# Patient Record
Sex: Female | Born: 1985 | Race: Black or African American | Hispanic: No | Marital: Single | Smoking: Former smoker
Health system: Southern US, Community
[De-identification: ages and names within clinical notes are randomized; demographics above are authoritative.]

## PROBLEM LIST (undated history)

## (undated) VITALS — BP 146/95 | HR 96 | Temp 98.2°F | Resp 18

## (undated) DIAGNOSIS — F250 Schizoaffective disorder, bipolar type: Secondary | ICD-10-CM

## (undated) DIAGNOSIS — IMO0001 Reserved for inherently not codable concepts without codable children: Secondary | ICD-10-CM

## (undated) DIAGNOSIS — E119 Type 2 diabetes mellitus without complications: Secondary | ICD-10-CM

## (undated) DIAGNOSIS — F259 Schizoaffective disorder, unspecified: Secondary | ICD-10-CM

---

## 2003-04-15 ENCOUNTER — Emergency Department (HOSPITAL_COMMUNITY): Admission: EM | Admit: 2003-04-15 | Discharge: 2003-04-15 | Payer: Self-pay | Admitting: Emergency Medicine

## 2003-09-24 ENCOUNTER — Emergency Department (HOSPITAL_COMMUNITY): Admission: EM | Admit: 2003-09-24 | Discharge: 2003-09-25 | Payer: Self-pay | Admitting: Emergency Medicine

## 2003-10-09 ENCOUNTER — Emergency Department (HOSPITAL_COMMUNITY): Admission: EM | Admit: 2003-10-09 | Discharge: 2003-10-09 | Payer: Self-pay | Admitting: Emergency Medicine

## 2003-11-07 ENCOUNTER — Emergency Department (HOSPITAL_COMMUNITY): Admission: EM | Admit: 2003-11-07 | Discharge: 2003-11-08 | Payer: Self-pay | Admitting: Emergency Medicine

## 2006-01-25 ENCOUNTER — Emergency Department (HOSPITAL_COMMUNITY): Admission: EM | Admit: 2006-01-25 | Discharge: 2006-01-26 | Payer: Self-pay | Admitting: Emergency Medicine

## 2014-06-05 ENCOUNTER — Emergency Department (HOSPITAL_COMMUNITY)
Admission: EM | Admit: 2014-06-05 | Discharge: 2014-06-10 | Disposition: A | Discharge status: Home / Self Care | Payer: MEDICAID | Attending: Emergency Medicine | Admitting: Emergency Medicine

## 2014-06-05 ENCOUNTER — Encounter (HOSPITAL_COMMUNITY): Payer: Self-pay | Admitting: Emergency Medicine

## 2014-06-05 DIAGNOSIS — Z79899 Other long term (current) drug therapy: Secondary | ICD-10-CM | POA: Diagnosis not present

## 2014-06-05 DIAGNOSIS — F259 Schizoaffective disorder, unspecified: Secondary | ICD-10-CM | POA: Diagnosis present

## 2014-06-05 DIAGNOSIS — Z720 Tobacco use: Secondary | ICD-10-CM | POA: Insufficient documentation

## 2014-06-05 DIAGNOSIS — F1310 Sedative, hypnotic or anxiolytic abuse, uncomplicated: Secondary | ICD-10-CM | POA: Diagnosis not present

## 2014-06-05 DIAGNOSIS — F258 Other schizoaffective disorders: Secondary | ICD-10-CM

## 2014-06-05 DIAGNOSIS — R440 Auditory hallucinations: Secondary | ICD-10-CM

## 2014-06-05 DIAGNOSIS — R441 Visual hallucinations: Secondary | ICD-10-CM | POA: Diagnosis present

## 2014-06-05 DIAGNOSIS — F250 Schizoaffective disorder, bipolar type: Secondary | ICD-10-CM | POA: Diagnosis not present

## 2014-06-05 DIAGNOSIS — F79 Unspecified intellectual disabilities: Secondary | ICD-10-CM | POA: Diagnosis not present

## 2014-06-05 HISTORY — DX: Schizoaffective disorder, unspecified: F25.9

## 2014-06-05 HISTORY — DX: Schizoaffective disorder, bipolar type: F25.0

## 2014-06-05 HISTORY — DX: Reserved for inherently not codable concepts without codable children: IMO0001

## 2014-06-05 LAB — COMPREHENSIVE METABOLIC PANEL
ALT: 9 U/L (ref 0–35)
AST: 16 U/L (ref 0–37)
Albumin: 3.8 g/dL (ref 3.5–5.2)
Alkaline Phosphatase: 45 U/L (ref 39–117)
Anion gap: 5 (ref 5–15)
BILIRUBIN TOTAL: 0.3 mg/dL (ref 0.3–1.2)
BUN: 12 mg/dL (ref 6–23)
CHLORIDE: 109 mmol/L (ref 96–112)
CO2: 28 mmol/L (ref 19–32)
CREATININE: 0.55 mg/dL (ref 0.50–1.10)
Calcium: 9 mg/dL (ref 8.4–10.5)
GFR calc Af Amer: >90 mL/min (ref >90)
Glucose, Bld: 101 mg/dL — ABNORMAL HIGH (ref 70–99)
Potassium: 3.7 mmol/L (ref 3.5–5.1)
SODIUM: 142 mmol/L (ref 135–145)
Total Protein: 7.9 g/dL (ref 6.0–8.3)

## 2014-06-05 LAB — SALICYLATE LEVEL: Salicylate Lvl: <4 mg/dL (ref 2.8–20.0)

## 2014-06-05 LAB — ETHANOL

## 2014-06-05 LAB — CBC
HEMATOCRIT: 32.6 % — AB (ref 36.0–46.0)
HEMOGLOBIN: 10.3 g/dL — AB (ref 12.0–15.0)
MCH: 30.1 pg (ref 26.0–34.0)
MCHC: 31.6 g/dL (ref 30.0–36.0)
MCV: 95.3 fL (ref 78.0–100.0)
Platelets: 228 10*3/uL (ref 150–400)
RBC: 3.42 MIL/uL — AB (ref 3.87–5.11)
RDW: 12.8 % (ref 11.5–15.5)
WBC: 7 10*3/uL (ref 4.0–10.5)

## 2014-06-05 LAB — ACETAMINOPHEN LEVEL

## 2014-06-05 MED ORDER — LORAZEPAM 1 MG PO TABS
1.0000 mg | ORAL_TABLET | Freq: Three times a day (TID) | ORAL | Status: DC | PRN
  Administered 2014-06-06 – 2014-06-08 (×4): 1 mg via ORAL
  Filled 2014-06-05 (×5): qty 1

## 2014-06-05 MED ORDER — ONDANSETRON HCL 4 MG PO TABS: 4.0000 mg | ORAL_TABLET | Freq: Three times a day (TID) | ORAL | Status: DC | PRN

## 2014-06-05 MED ORDER — ACETAMINOPHEN 325 MG PO TABS
650.0000 mg | ORAL_TABLET | ORAL | Status: DC | PRN
  Administered 2014-06-08: 650 mg via ORAL
  Filled 2014-06-05: qty 2

## 2014-06-05 MED ORDER — ALUM & MAG HYDROXIDE-SIMETH 200-200-20 MG/5ML PO SUSP
30.0000 mL | ORAL | Status: DC | PRN
  Filled 2014-06-05: qty 30

## 2014-06-05 MED ORDER — IBUPROFEN 200 MG PO TABS: 600.0000 mg | ORAL_TABLET | Freq: Three times a day (TID) | ORAL | Status: DC | PRN

## 2014-06-05 NOTE — ED Provider Notes (Signed)
CSN: [id number]     Arrival date & time 06/05/14  2211 History   First MD Initiated Contact with Patient 06/05/14 2248     Chief Complaint  Patient presents with  . Schizophrenia     (Consider location/radiation/quality/duration/timing/severity/associated sxs/prior Treatment) HPI Comments: Patient is a 29 year old female with a past medical history of schizoaffective disorder and mental retardation who presents via [facility] Department for IVC. Patient was being transported to a [facility] group home when her schizoaffective symptoms acutely worsened. Patient is now having visual hallucinations and delusional thoughts. 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 According to her caregiver, patient has not been sleeping, eating, or cleaning herself. Patient reports taking her medication daily. She also had auditory hallucinations telling her to kill her house mate.     Past Medical History  Diagnosis Date  . Schizo-affective schizophrenia   . Retardation    No past surgical history on file. No family history on file. History  Substance Use Topics  . Smoking status: Current Some Day Smoker  . Smokeless tobacco: Never Used  . Alcohol Use: No   OB History    No data available     Review of Systems  Constitutional: Negative for fever, chills and fatigue.  HENT: Negative for trouble swallowing.   Eyes: Negative for visual disturbance.  Respiratory: Negative for shortness of breath.   Cardiovascular: Negative for chest pain and palpitations.  Gastrointestinal: Negative for nausea, vomiting, abdominal pain and diarrhea.  Genitourinary: Negative for dysuria and difficulty urinating.  Musculoskeletal: Negative for arthralgias and neck pain.  Skin: Negative for color change.  Neurological: Negative for dizziness and weakness.   Psychiatric/Behavioral: Positive for hallucinations. Negative for dysphoric mood.      Allergies  Review of patient's allergies indicates no known allergies.  Home Medications   Prior to Admission medications   Medication Sig Start Date End Date Taking? Authorizing Provider  acetaminophen (TYLENOL) 500 MG tablet Take 500 mg by mouth every 6 (six) hours as needed (for pain.).   Yes Historical Provider, MD  benztropine (COGENTIN) 1 MG tablet Take 1 mg by mouth 2 (two) times daily.   Yes Historical Provider, MD  Cholecalciferol (VITAMIN D3) 2000 UNITS TABS Take 2,000 Units by mouth daily.   Yes Historical Provider, MD  clonazePAM (KLONOPIN) 1 MG tablet Take 1 mg by mouth 2 (two) times daily.   Yes Historical Provider, MD  ferrous sulfate 325 (65 FE) MG tablet Take 325 mg by mouth daily.   Yes Historical Provider, MD  haloperidol (HALDOL) 2 MG/ML solution Take 10 mg by mouth 2 (two) times daily.   Yes Historical Provider, MD  potassium chloride (K-DUR) 10 MEQ tablet Take 10 mEq by mouth daily.   Yes Historical Provider, MD  trazodone (DESYREL) 300 MG tablet Take 300 mg by mouth at bedtime.   Yes Historical Provider, MD  valproate (DEPAKENE) 250 MG/5ML syrup Take 500-1,000 mg by mouth 2 (two) times daily. 500 mg in the morning and 1000 mg at bedtime.   Yes Historical Provider, MD   BP 138/73 mmHg  Pulse 85  Temp(Src) 97.8 F (36.6 C) (Oral)  Resp 20  SpO2 97%  LMP  (LMP Unknown) Physical Exam  Constitutional: She is oriented to person, place, and time. She appears well-developed and well-nourished. No distress.  HENT:  Head: Normocephalic  and atraumatic.  Eyes: Conjunctivae and EOM are normal.  Neck: Normal range of motion.  Cardiovascular: Normal rate and regular rhythm.  Exam reveals no gallop and no friction rub.   No murmur heard. Pulmonary/Chest: Effort normal and breath sounds normal. She has no wheezes. She has no rales. She exhibits no tenderness.  Abdominal: Soft. There is  no tenderness.  Musculoskeletal: Normal range of motion.  Neurological: She is alert and oriented to person, place, and time. Coordination normal.  Speech is goal-oriented. Moves limbs without ataxia.   Skin: Skin is warm and dry.  Psychiatric: She has a normal mood and affect.  Patient is delusional and having visual hallucinations. Patient has a tangential thought process and odd behavior. She is calm and pleasant.   Nursing note and vitals reviewed.   ED Course  Procedures (including critical care time) Labs Review Labs Reviewed  ACETAMINOPHEN LEVEL - Abnormal; Notable for the following:    Acetaminophen (Tylenol), Serum <10.0 (*)    All other components within normal limits  CBC - Abnormal; Notable for the following:    RBC 3.42 (*)    Hemoglobin 10.3 (*)    HCT 32.6 (*)    All other components within normal limits  COMPREHENSIVE METABOLIC PANEL - Abnormal; Notable for the following:    Glucose, Bld 101 (*)    All other components within normal limits  ETHANOL  SALICYLATE LEVEL  URINE RAPID DRUG SCREEN (HOSP PERFORMED)  VALPROIC ACID LEVEL    Imaging Review No results found.   EKG Interpretation None      MDM   Final diagnoses:  Visual hallucination  Auditory hallucination    11:34 PM Labs and urinalysis pending. Patient is delusion and having visual hallucinations. Vitals stable and patient afebrile.    Emilia BeckKaitlyn Joahan Swatzell, PA-C 06/06/14 0107  Olivia Mackielga M Otter, MD 06/06/14 [phone]

## 2014-06-05 NOTE — ED Notes (Signed)
Pt arrived under IVC paperwork [facility].  Per the paperwork which was taken out by a Child psychotherapistsocial worker, patient has been diagnosed with schizoaffective disorder and moderate retardation.  Pt takes medications , which according, to patient she has been taking but according to the paperwork she is not.  Paperwork has indicated that she is not sleeping, eating or taking care of her personal hygene.  Patient is stated to be hearing voices telling her to kill her house mate.  Exam of patient finds her having delusions and flight of ideas.  Pt states she is a super star. Pt believes that an ambulance has come to her house and killed everyone in it including herself.

## 2014-06-06 ENCOUNTER — Encounter (HOSPITAL_COMMUNITY): Payer: Self-pay | Admitting: Emergency Medicine

## 2014-06-06 DIAGNOSIS — F209 Schizophrenia, unspecified: Secondary | ICD-10-CM

## 2014-06-06 DIAGNOSIS — F259 Schizoaffective disorder, unspecified: Secondary | ICD-10-CM | POA: Diagnosis present

## 2014-06-06 DIAGNOSIS — R440 Auditory hallucinations: Secondary | ICD-10-CM | POA: Insufficient documentation

## 2014-06-06 DIAGNOSIS — R441 Visual hallucinations: Secondary | ICD-10-CM | POA: Insufficient documentation

## 2014-06-06 DIAGNOSIS — F258 Other schizoaffective disorders: Secondary | ICD-10-CM

## 2014-06-06 LAB — RAPID URINE DRUG SCREEN, HOSP PERFORMED
AMPHETAMINES: NOT DETECTED
BARBITURATES: NOT DETECTED
BENZODIAZEPINES: POSITIVE — AB
Cocaine: NOT DETECTED
OPIATES: NOT DETECTED
TETRAHYDROCANNABINOL: NOT DETECTED

## 2014-06-06 LAB — VALPROIC ACID LEVEL: Valproic Acid Lvl: 79.7 ug/mL (ref 50.0–100.0)

## 2014-06-06 MED ORDER — VALPROIC ACID 250 MG/5ML PO SYRP
1000.0000 mg | ORAL_SOLUTION | Freq: Every day | ORAL | Status: DC
  Filled 2014-06-06: qty 20

## 2014-06-06 MED ORDER — HALOPERIDOL 5 MG PO TABS: 10.0000 mg | ORAL_TABLET | Freq: Two times a day (BID) | ORAL | Status: DC

## 2014-06-06 MED ORDER — CLONAZEPAM 1 MG PO TABS
1.0000 mg | ORAL_TABLET | Freq: Two times a day (BID) | ORAL | Status: DC
  Administered 2014-06-06 – 2014-06-10 (×9): 1 mg via ORAL
  Filled 2014-06-06: qty 2
  Filled 2014-06-06 (×4): qty 1
  Filled 2014-06-06: qty 2
  Filled 2014-06-06: qty 1
  Filled 2014-06-06: qty 2
  Filled 2014-06-06: qty 1

## 2014-06-06 MED ORDER — HALOPERIDOL LACTATE 2 MG/ML PO CONC
10.0000 mg | Freq: Two times a day (BID) | ORAL | Status: DC
  Administered 2014-06-06 – 2014-06-10 (×9): 10 mg via ORAL
  Filled 2014-06-06 (×14): qty 5

## 2014-06-06 MED ORDER — BENZTROPINE MESYLATE 1 MG PO TABS: 1.0000 mg | ORAL_TABLET | Freq: Two times a day (BID) | ORAL | Status: DC

## 2014-06-06 MED ORDER — HALOPERIDOL LACTATE 2 MG/ML PO CONC: 10.0000 mg | Freq: Two times a day (BID) | ORAL | Status: DC

## 2014-06-06 MED ORDER — CLONAZEPAM 0.5 MG PO TABS: 1.0000 mg | ORAL_TABLET | Freq: Two times a day (BID) | ORAL | Status: DC

## 2014-06-06 MED ORDER — TRAZODONE HCL 100 MG PO TABS
300.0000 mg | ORAL_TABLET | Freq: Every day | ORAL | Status: DC
  Administered 2014-06-06 – 2014-06-09 (×4): 300 mg via ORAL
  Filled 2014-06-06 (×4): qty 3

## 2014-06-06 MED ORDER — FERROUS SULFATE 325 (65 FE) MG PO TABS
325.0000 mg | ORAL_TABLET | Freq: Every day | ORAL | Status: DC
  Administered 2014-06-06 – 2014-06-10 (×5): 325 mg via ORAL
  Filled 2014-06-06 (×5): qty 1

## 2014-06-06 MED ORDER — POTASSIUM CHLORIDE ER 10 MEQ PO TBCR
10.0000 meq | EXTENDED_RELEASE_TABLET | Freq: Every day | ORAL | Status: DC
  Administered 2014-06-06 – 2014-06-10 (×5): 10 meq via ORAL
  Filled 2014-06-06 (×6): qty 1

## 2014-06-06 MED ORDER — VALPROIC ACID 250 MG/5ML PO SYRP
1000.0000 mg | ORAL_SOLUTION | Freq: Every day | ORAL | Status: DC
  Administered 2014-06-06 – 2014-06-09 (×4): 1000 mg via ORAL
  Filled 2014-06-06 (×7): qty 20

## 2014-06-06 MED ORDER — BENZTROPINE MESYLATE 1 MG PO TABS
1.0000 mg | ORAL_TABLET | Freq: Two times a day (BID) | ORAL | Status: DC
  Administered 2014-06-06 – 2014-06-10 (×9): 1 mg via ORAL
  Filled 2014-06-06 (×9): qty 1

## 2014-06-06 MED ORDER — VALPROIC ACID 250 MG/5ML PO SYRP
500.0000 mg | ORAL_SOLUTION | Freq: Every day | ORAL | Status: DC
  Administered 2014-06-06 – 2014-06-10 (×5): 500 mg via ORAL
  Filled 2014-06-06 (×5): qty 10
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
## 2014-06-06 NOTE — ED Notes (Signed)
Patient is resting comfortably. 

## 2014-06-06 NOTE — BHH Counselor (Signed)
Brittany Velez, [facility], confirms adult unit is currently at capacity. Contacted the following facilities for placement:  BED AVAILABLE, FAXED CLINICAL INFORMATION: [facility], per [facility], per [employer]  AT CAPACITY: [facility], per [facility], per [facility], per [facility], per [facility], per [facility], per [facility], per [facility], per [facility], per [facility], per [facility]  NO RESPONSE: [facility]   [address] Tall[address]Ford Ellis Patsy BaltimoreWarrick Jr, WisconsinLPC, [facility] Triage Specialist [phone]

## 2014-06-06 NOTE — BH Assessment (Signed)
Assessment completed. Consulted [name], NP who recommended inpatient treatment. TTS will contact other facilities for placement. Emilia BeckKaitlyn Szekalski, PA-C has been informed of the recommendation.

## 2014-06-06 NOTE — ED Notes (Signed)
Psychiatry at bedside.

## 2014-06-06 NOTE — ED Notes (Signed)
Patient resting quietly with eyes closed. Appears in no acute distress. Respirations are even and unlabored. Sitter remains at bedside.

## 2014-06-06 NOTE — ED Notes (Signed)
Pt has three shirt, one pants, one pair socks, one pair shoes, one bra, two jackets. Pt has two belonging bags in locker #31.

## 2014-06-06 NOTE — ED Notes (Signed)
Pt drunk all her drink and milk from her PM meal tray. Provided patient a cup of Sprite. When patient took her medication, she made reference about a rainbow before taking her cogentin and before taking her Klonopin, she stated "I am going to fuck you up" while looking at her pills. Pt will not communicate with staff. When she does talk, she is experiencing hallucinations. Sitter remains at bedside.

## 2014-06-06 NOTE — ED Notes (Signed)
Pt is resting quietly on her left side. Sitter at bedside. Since start of shift change, intermittently seen patient talking to herself. She does deny SI or HI. Pt will not talk beyond telling me she is not SI or HI.

## 2014-06-06 NOTE — ED Notes (Signed)
Pt ate 75% of dinner.  

## 2014-06-06 NOTE — ED Notes (Signed)
Bed: [id number] Expected date: 06/06/14 Expected time: 11:44 AM Means of arrival: Ambulance Comments: Antony Madurautledge

## 2014-06-06 NOTE — ED Notes (Signed)
Offered toileting but patient denied needing to go to the restroom. Provided patient another cup of Sprite. Sitter remains at bedside.

## 2014-06-06 NOTE — ED Notes (Signed)
Patient ate 100% of her lunch. 

## 2014-06-06 NOTE — ED Notes (Signed)
[facility] Expected date:  Expected time:  Means of arrival:  Comments: Room 10 in hall, maintenance in room 10

## 2014-06-06 NOTE — Progress Notes (Signed)
CSW called her guardian Sherron MondayStacey Skradski in order to obtain her IQ paperwork.  CSW contacted [facility] and [facility]nn [facility] who reported would be willing to consider her for placement but not until her psychological paperwork was obtained.  Follow up will be needed with her guardian in order to obtain the paperwork.  Adelene AmasEdith Sarann Tregre, LCSW Disposition Social Worker [phone]

## 2014-06-06 NOTE — Consult Note (Signed)
[facility] Face-to-Face [facility]iatry Consult   Reason for Consult:  Psychosis,NOS, Delusion Referring Physician:  EDP Patient Identification: Brittany FellerLinda Velez MRN:  [id number] Principal Diagnosis: Schizophrenia, schizoaffective, chronic with acute exacerbation Diagnosis:   Patient Active Problem List   Diagnosis Date Noted  . Schizophrenia, schizoaffective, chronic with acute exacerbation [F25.8] 06/06/2014    Total Time spent with patient: 45 minutes  Subjective:   Brittany Velez is a 29 y.o. female patient admitted with Delusion.  HPI: AA female was brought in under IVC from her group home for delusional thoughts and auditory hallucination.  Patient was not able to answer questions about her visit to the ER but lightened up when asked about her pending Gram[employer] attendance next month.  Patient stated that people behind her ER room wall will be taking her to the [employer] award ceremony.   Patient was not able to to name her group home and did not remember having a house mate.  She denied wanting to kill his house mate.  She reported poor sleep and appetite.  Patient looked disheveled seems to have not been taking care of her ADLS.  She had flight of ideas jumping from one topic to another.  We have accepted patient for admission and will be looking for bed.  We have resumed all of her medication at this time.  She denies SI/HI/AVH.  HPI Elements:   Location:  Schizophrenia ScHIZOAFFECTIVE DISORDER. Quality:  SEVERE. Severity:  SEVERE. Timing:  Acute,. Duration:  Chronic Mental illness. Context:  Brought in for evaluation from a group home. for Psychosis, delusion.  Past Medical History:  Past Medical History  Diagnosis Date  . Schizo-affective schizophrenia   . Retardation    History reviewed. No pertinent past surgical history. Family History: History reviewed. No pertinent family history. Social History:  History  Alcohol Use No     History  Drug Use No    History   Social History  .  Marital Status: Single    Spouse Name: N/A    Number of Children: N/A  . Years of Education: N/A   Social History Main Topics  . Smoking status: Current Some Day Smoker  . Smokeless tobacco: Never Used  . Alcohol Use: No  . Drug Use: No  . Sexual Activity: Yes   Other Topics Concern  . None   Social History Narrative   Additional Social History:    History of alcohol / drug use?: No history of alcohol / drug abuse                     Allergies:  No Known Allergies  Vitals: Blood pressure 130/89, pulse 86, temperature 98.2 F (36.8 C), temperature source Oral, resp. rate 17, SpO2 100 %.  Risk to Self: Suicidal Ideation: No Suicidal Intent: No Is patient at risk for suicide?: No Suicidal Plan?: No Access to Means: No What has been your use of drugs/alcohol within the last 12 months?: No alcohol or drug use reported.  How many times?: 0 Other Self Harm Risks: It has been reported that pt has been banging her head on the wall.  Triggers for Past Attempts: None known Intentional Self Injurious Behavior:  (Head banging ) Risk to Others: Homicidal Ideation: No Thoughts of Harm to Others: No Current Homicidal Intent: No Current Homicidal Plan: No Access to Homicidal Means: No Identified Victim: NA (NA) History of harm to others?: No Assessment of Violence: On admission Violent Behavior Description: No violent behaviors observed at this  time. Pt is calm and cooperative.  Does patient have access to weapons?: No Criminal Charges Pending?: No Does patient have a court date: No Prior Inpatient Therapy: Prior Inpatient Therapy: Yes Prior Therapy Dates: 2008 Prior Therapy Facilty/Provider(s): [facility]  Reason for Treatment: Schizoaffective Prior Outpatient Therapy: Prior Outpatient Therapy: Yes Prior Therapy Dates: 2015 Prior Therapy Facilty/Provider(s): Provider name unknown at this time. [facility]  Reason for Treatment: Schizoaffective disorder  Current  Facility-Administered Medications  Medication Dose Route Frequency Provider Last Rate Last Dose  . acetaminophen (TYLENOL) tablet 650 mg  650 mg Oral Q4H PRN Emilia Beck, PA-C      . alum & mag hydroxide-simeth (MAALOX/MYLANTA) 200-200-20 MG/5ML suspension 30 mL  30 mL Oral PRN Emilia Beck, PA-C      . benztropine (COGENTIN) tablet 1 mg  1 mg Oral BID Kristeen Mans, NP   1 mg at 06/06/14 1006  . clonazePAM (KLONOPIN) tablet 1 mg  1 mg Oral BID Kristeen Mans, NP   1 mg at 06/06/14 1006  . ferrous sulfate tablet 325 mg  325 mg Oral Daily Kristeen Mans, NP   325 mg at 06/06/14 1006  . haloperidol (HALDOL) 2 MG/ML solution 10 mg  10 mg Oral BID Kristeen Mans, NP   10 mg at 06/06/14 1006  . ibuprofen (ADVIL,MOTRIN) tablet 600 mg  600 mg Oral Q8H PRN Emilia Beck, PA-C      . LORazepam (ATIVAN) tablet 1 mg  1 mg Oral Q8H PRN Emilia Beck, PA-C      . ondansetron (ZOFRAN) tablet 4 mg  4 mg Oral Q8H PRN Kaitlyn Szekalski, PA-C      . potassium chloride (K-DUR) CR tablet 10 mEq  10 mEq Oral Daily Kristeen Mans, NP   10 mEq at 06/06/14 1006  . traZODone (DESYREL) tablet 300 mg  300 mg Oral QHS Kristeen Mans, NP      . Valproic Acid (DEPAKENE) 250 MG/5ML syrup SYRP 1,000 mg  1,000 mg Oral QHS Kristeen Mans, NP      . Valproic Acid (DEPAKENE) 250 MG/5ML syrup SYRP 500 mg  500 mg Oral Daily Kristeen Mans, NP   500 mg at 06/06/14 1007   Current Outpatient Prescriptions  Medication Sig Dispense Refill  . acetaminophen (TYLENOL) 500 MG tablet Take 500 mg by mouth every 6 (six) hours as needed (for pain.).    Marland Kitchen benztropine (COGENTIN) 1 MG tablet Take 1 mg by mouth 2 (two) times daily.    . Cholecalciferol (VITAMIN D3) 2000 UNITS TABS Take 2,000 Units by mouth daily.    . clonazePAM (KLONOPIN) 1 MG tablet Take 1 mg by mouth 2 (two) times daily.    . ferrous sulfate 325 (65 FE) MG tablet Take 325 mg by mouth daily.    . haloperidol (HALDOL) 2 MG/ML solution Take 10 mg by mouth 2 (two) times  daily.    . potassium chloride (K-DUR) 10 MEQ tablet Take 10 mEq by mouth daily.    . trazodone (DESYREL) 300 MG tablet Take 300 mg by mouth at bedtime.    . valproate (DEPAKENE) 250 MG/5ML syrup Take 500-1,000 mg by mouth 2 (two) times daily. 500 mg in the morning and 1000 mg at bedtime.      Musculoskeletal: Strength & Muscle Tone: unable to assess, was seen lying down in bed Gait & Station: assessed in bed lying down Patient leans: assesssed in bed   Psychiatric Specialty Exam:  Blood pressure 130/89, pulse 86, temperature 98.2 F (36.8 C), temperature source Oral, resp. rate 17, SpO2 100 %.There is no height or weight on file to calculate BMI.  General Appearance: Casual and Disheveled  Eye Contact::  Poor  Speech:  Blocked and Slow  Volume:  Decreased  Mood:  Dysphoric  Affect:  Blunt and Constricted  Thought Process:  Circumstantial, Disorganized, Linear and Tangential  Orientation:  Full (Time, Place, and Person)  Thought Content:  Delusions and Hallucinations: Auditory Visual  Suicidal Thoughts:  No  Homicidal Thoughts:  No  Memory:  unable to ascertain at this time.  Judgement:  Impaired  Insight:  Shallow  Psychomotor Activity:  Normal  Concentration:  Poor  Recall:  NA  Fund of Knowledge:Poor  Language: Poor  Akathisia:  NA  Handed:  Right  AIMS (if indicated):     Assets:  Desire for Improvement  ADL's:  Impaired  Cognition: Impaired,  Severe  Sleep:      Medical Decision Making: Self-Limited or Minor (1), Review of Psycho-Social Stressors (1), Review or order clinical lab tests (1), Decision to obtain old records (1), Review and summation of old records (2), Established Problem, Worsening (2), Review of Medication Regimen & Side Effects (2) and Review of New Medication or Change in Dosage (2)  Problem Points: Established problem, worsening (2) and Review of psycho-social stressors (1)  Data Points: Decision to obtain old records (1) Review of  medication regiment & side effects (2) Review of new medications or change in dosage (2)  Treatment Plan Summary: Daily contact with patient to assess and evaluate symptoms and progress in treatment, Medication management and Plan Admit to inpatient Psychiatric unit when bed is available.  Plan:  Recommend psychiatric Inpatient admission when medically cleared. Disposition: Admit to any inpatient Psychiatric facility with available bed.  Earney Navy  PMHNP-BC 06/06/2014 12:32 PM  Patient seen, evaluated and I agree with notes by Nurse Practitioner. Thedore Mins, MD

## 2014-06-07 MED ORDER — LORAZEPAM 2 MG/ML IJ SOLN
2.0000 mg | Freq: Once | INTRAMUSCULAR | Status: AC
  Administered 2014-06-07: 2 mg via INTRAMUSCULAR
  Filled 2014-06-07: qty 1

## 2014-06-07 MED ORDER — DIPHENHYDRAMINE HCL 50 MG/ML IJ SOLN
50.0000 mg | Freq: Once | INTRAMUSCULAR | Status: AC
  Administered 2014-06-07: 50 mg via INTRAMUSCULAR
  Filled 2014-06-07: qty 1

## 2014-06-07 MED ORDER — ZIPRASIDONE MESYLATE 20 MG IM SOLR: 20.0000 mg | Freq: Once | INTRAMUSCULAR | Status: AC | PRN

## 2014-06-07 NOTE — ED Notes (Signed)
Pt resting quietly with eyes closed. Lying on her back. Respirations are even and unlabored. Skin dry. Sitter remains at bedside.

## 2014-06-07 NOTE — Progress Notes (Signed)
CSW was notified by NP that patient has been declined by [facility].   Trish MageBrittney Tyeisha Dinan, LCSWA [phone] ED CSW 06/07/2014 5:12 PM

## 2014-06-07 NOTE — ED Notes (Signed)
Pt resting quietly with eyes closed. Appears in no distress. Respirations are even, unlabored. Skin is dry. Sitter remains at bedside.

## 2014-06-07 NOTE — Progress Notes (Addendum)
[facility] pending psychological. CSW spoke with Cassandra from [employer] who will fax Psychological once in the office. CSW awaiting return call.   Byrd HesselbachKristen June Vacha, LCSW [phone]  ED CSW 06/07/2014 945am  Addendum: [facility], patient is still being reviewed pending psychological.   Byrd HesselbachKristen Tamika Shropshire, LCSW [phone]  ED CSW 06/07/2014 10:00 AM

## 2014-06-07 NOTE — BH Assessment (Addendum)
[facility] Assessment Progress Note  After several communications with [facility], they have acknowledged receiving all documentation that they need in order to staff this pt with their physician.  Decision is pending at this time.  [facility].  They report that pt's IQ is too low for their program and are therefore declining pt.  Doylene Canninghomas Tashawna Thom, MA Triage Specialist 06/07/2014 @ 15:30

## 2014-06-07 NOTE — ED Notes (Addendum)
Patient is resting comfortably with eyes cloded. Sitter at bedside. Respira ions are even, unlabored. Skin is dry.

## 2014-06-07 NOTE — ED Notes (Signed)
Observed patient this morning get up out of bed, walk to restroom and use facilities on her own (without assistance from staff).

## 2014-06-07 NOTE — Progress Notes (Signed)
Per guardian, patient from Brittany Velez, Brittany Velez is Brittany Velez [phone]. Per guardian pt is being accepted back to group home once psychiatrically stable.   Byrd HesselbachKristen Dasan Hardman, LCSW [phone]  ED CSW 06/07/2014 3:01 PM

## 2014-06-07 NOTE — ED Notes (Signed)
[facility] Expected date: 06/07/14 Expected time:  Means of arrival:  Comments: Hold for hall c

## 2014-06-07 NOTE — ED Notes (Signed)
Pt alert and oriented x4. Respirations even and unlabored, bilateral symmetrical rise and fall of chest. Skin warm and dry. In no acute distress. Denies needs.   

## 2014-06-07 NOTE — ED Notes (Signed)
Patient saw visitor go by and became aggressive towards sitter. Patient did not harm staff or sitter. Patient was yelling and screaming at staff and sitter. With help from other team members and security patient was redirected to stretcher and given juice. Patient is now calmly sitting on stretcher with sitter at bedside.

## 2014-06-07 NOTE — ED Notes (Signed)
Patient with minimal interaction during initial nursing assessment.  Is oriented to person and birthdate only.  Is periodically responding to internal stimuli. Oriented to unit.  Food/fluids given.

## 2014-06-07 NOTE — ED Notes (Signed)
Pt woke up, sitting on stretcher. Provided patient two warm blankets. Will intermittently talk softly to no particular person.

## 2014-06-07 NOTE — Consult Note (Signed)
[facility]   Reason for Consult:  Psychosis and Delusional Referring Physician:  EDP Patient Identification: Brittany FellerLinda Velez MRN:  [id number] Principal Diagnosis: Schizophrenia, schizoaffective, chronic with acute exacerbation Diagnosis:   Patient Active Problem List   Diagnosis Date Noted  . Schizophrenia, schizoaffective, chronic with acute exacerbation [F25.8] 06/06/2014  . Auditory hallucination [R44.0]   . Visual hallucination [R44.1]     Total Time spent with patient: 30 minutes  Subjective:   Brittany FellerLinda Velez is a 29 y.o. female patient admitted with delusion and psychosis.   HPI: Brittany Velez is a 29 yo [place] female who was brought in under IVC from her group home for delusional thoughts and auditory hallucinations.  Patient states "the police brought me here from the group home for bad behavior." She is seen today in the Emergency Department. She is alert, calm and cooperative. She is undoing her braided hair "because my spirit doesn't like it." She reports she does not like being alone and does not want to "be put any where dark." She continues to talk about "dancing with [name]" at [employer] next [place].  She denies SI/HI. She is responding to auditory hallucinations. Marland Kitchen.    HPI Elements:    Location: Mood (Schizophrenia) Quality: Severe Severity: Severe Timing: Acute Duration: Chronic Mental Illness Context: psychosis, delusion - brought in from group home for evaluation  Past Medical History:  Past Medical History  Diagnosis Date  . Schizo-affective schizophrenia   . Retardation    History reviewed. No pertinent past surgical history. Family History: History reviewed. No pertinent family history. Social History:  History  Alcohol Use No     History  Drug Use No    History   Social History  . Marital Status: Single    Spouse Name: N/A    Number of Children: N/A  . Years of Education: N/A   Social History Main Topics  .  Smoking status: Current Some Day Smoker  . Smokeless tobacco: Never Used  . Alcohol Use: No  . Drug Use: No  . Sexual Activity: Yes   Other Topics Concern  . None   Social History Narrative   Review of Systems  Constitutional: Negative.   HENT: Negative.   Eyes: Negative.   Respiratory: Negative.   Cardiovascular: Negative.   Gastrointestinal: Negative.   Genitourinary: Negative.   Musculoskeletal: Negative.   Skin: Negative.   Neurological: Negative.   Endo/Heme/Allergies: Negative.   Psychiatric/Behavioral: Positive for hallucinations.       Auditory hallucinations   Additional Social History:    History of alcohol / drug use?: No history of alcohol / drug abuse  Allergies:  No Known Allergies  Vitals: Blood pressure 116/65, pulse 85, temperature 98.1 F (36.7 C), temperature source Oral, resp. rate 16, SpO2 100 %.  Risk to Self: Suicidal Ideation: No Suicidal Intent: No Is patient at risk for suicide?: No Suicidal Plan?: No Access to Means: No What has been your use of drugs/alcohol within the last 12 months?: No alcohol or drug use reported.  How many times?: 0 Other Self Harm Risks: It has been reported that pt has been banging her head on the wall.  Triggers for Past Attempts: None known Intentional Self Injurious Behavior:  (Head banging ) Risk to Others: Homicidal Ideation: No Thoughts of Harm to Others: No Current Homicidal Intent: No Current Homicidal Plan: No Access to Homicidal Means: No Identified Victim: NA (NA) History of harm to others?: No Assessment of Violence: On  admission Violent Behavior Description: No violent behaviors observed at this time. Pt is calm and cooperative.  Does patient have access to weapons?: No Criminal Charges Pending?: No Does patient have a court date: No Prior Inpatient Therapy: Prior Inpatient Therapy: Yes Prior Therapy Dates: 2008 Prior Therapy Facilty/Provider(s): [facility]  Reason for Treatment:  Schizoaffective Prior Outpatient Therapy: Prior Outpatient Therapy: Yes Prior Therapy Dates: 2015 Prior Therapy Facilty/Provider(s): Provider name unknown at this time. [facility]  Reason for Treatment: Schizoaffective disorder  Current Facility-Administered Medications  Medication Dose Route Frequency Provider Last Rate Last Dose  . acetaminophen (TYLENOL) tablet 650 mg  650 mg Oral Q4H PRN Emilia Beck, PA-C      . alum & mag hydroxide-simeth (MAALOX/MYLANTA) 200-200-20 MG/5ML suspension 30 mL  30 mL Oral PRN Emilia Beck, PA-C      . benztropine (COGENTIN) tablet 1 mg  1 mg Oral BID Kristeen Mans, NP   1 mg at 06/07/14 1610  . clonazePAM (KLONOPIN) tablet 1 mg  1 mg Oral BID Kristeen Mans, NP   1 mg at 06/07/14 9604  . ferrous sulfate tablet 325 mg  325 mg Oral Daily Kristeen Mans, NP   325 mg at 06/07/14 5409  . haloperidol (HALDOL) 2 MG/ML solution 10 mg  10 mg Oral BID Kristeen Mans, NP   10 mg at 06/07/14 8119  . ibuprofen (ADVIL,MOTRIN) tablet 600 mg  600 mg Oral Q8H PRN Emilia Beck, PA-C      . LORazepam (ATIVAN) tablet 1 mg  1 mg Oral Q8H PRN Emilia Beck, PA-C   1 mg at 06/06/14 1630  . ondansetron (ZOFRAN) tablet 4 mg  4 mg Oral Q8H PRN Emilia Beck, PA-C      . potassium chloride (K-DUR) CR tablet 10 mEq  10 mEq Oral Daily Kristeen Mans, NP   10 mEq at 06/07/14 1478  . traZODone (DESYREL) tablet 300 mg  300 mg Oral QHS Kristeen Mans, NP   300 mg at 06/06/14 2125  . Valproic Acid (DEPAKENE) 250 MG/5ML syrup SYRP 1,000 mg  1,000 mg Oral QHS Kristeen Mans, NP   1,000 mg at 06/06/14 2220  . Valproic Acid (DEPAKENE) 250 MG/5ML syrup SYRP 500 mg  500 mg Oral Daily Kristeen Mans, NP   500 mg at 06/07/14 2956   Current Outpatient Prescriptions  Medication Sig Dispense Refill  . acetaminophen (TYLENOL) 500 MG tablet Take 500 mg by mouth every 6 (six) hours as needed (for pain.).    Marland Kitchen benztropine (COGENTIN) 1 MG tablet Take 1 mg by mouth 2 (two) times daily.     . Cholecalciferol (VITAMIN D3) 2000 UNITS TABS Take 2,000 Units by mouth daily.    . clonazePAM (KLONOPIN) 1 MG tablet Take 1 mg by mouth 2 (two) times daily.    . ferrous sulfate 325 (65 FE) MG tablet Take 325 mg by mouth daily.    . haloperidol (HALDOL) 2 MG/ML solution Take 10 mg by mouth 2 (two) times daily.    . potassium chloride (K-DUR) 10 MEQ tablet Take 10 mEq by mouth daily.    . trazodone (DESYREL) 300 MG tablet Take 300 mg by mouth at bedtime.    . valproate (DEPAKENE) 250 MG/5ML syrup Take 500-1,000 mg by mouth 2 (two) times daily. 500 mg in the morning and 1000 mg at bedtime.      Musculoskeletal: Strength & Muscle Tone: unable to assess, was seen lying down in bed Gait &  Station: assessed in bed lying down Patient leans: assesssed in bed   Psychiatric Specialty Exam:     Blood pressure 116/65, pulse 85, temperature 98.1 F (36.7 C), temperature source Oral, resp. rate 16, SpO2 100 %.There is no height or weight on file to calculate BMI.  General Appearance: Casual and Disheveled  Eye Contact::  Poor  Speech:  Blocked and Slow  Volume:  Decreased  Mood:  Dysphoric  Affect:  Blunt and Constricted  Thought Process:  Circumstantial, Disorganized, Linear and Tangential  Orientation:  Full (Time, Place, and Person)  Thought Content:  Delusions and Hallucinations: Auditory Visual  Suicidal Thoughts:  No  Homicidal Thoughts:  No  Memory:  Unable to assess at this time  Judgement:  Impaired  Insight:  Shallow  Psychomotor Activity:  Normal  Concentration:  Poor  Recall:  NA  Fund of Knowledge:Poor  Language: Poor  Akathisia:  NA  Handed:  Right  AIMS (if indicated):     Assets:  Desire for Improvement  ADL's:  Impaired  Cognition: Impaired,  Severe  Sleep:      Medical Decision Making: Self-Limited or Minor (1), Review of Psycho-Social Stressors (1), Review or order clinical lab tests (1), Decision to obtain old records (1), Review and summation of old records  (2), Established Problem, Worsening (2), Review of Medication Regimen & Side Effects (2) and Review of New Medication or Change in Dosage (2)  Problem Points: Established problem, worsening (2) and Review of psycho-social stressors (1)  Data Points: Decision to obtain old records (1) Review of medication regiment & side effects (2) Review of new medications or change in dosage (2)  Treatment Plan Summary: -Daily contact with patient to assess and evaluate symptoms and progress in treatment -Medication management: home medications have been restarted -Seek inpatient psychiatric placement  Plan:  Recommend psychiatric Inpatient admission when medically cleared.   Disposition: Patient currently under review at [facility]. Continue to seek inpatient placement.   Alberteen Sam, FNP-BC [facility] 06/07/2014 10:56 AM   Patient seen, evaluated and I agree with notes by Nurse Practitioner. Thedore Mins, MD

## 2014-06-07 NOTE — Progress Notes (Signed)
Pt guardian faxing psychological for [facility] to review. CSW to send to [facility] once received.   Byrd HesselbachKristen Lorrayne Ismael, LCSW [phone]  ED CSW 06/07/2014 11:32 AM

## 2014-06-08 DIAGNOSIS — F258 Other schizoaffective disorders: Secondary | ICD-10-CM

## 2014-06-08 NOTE — Progress Notes (Addendum)
Per discussion with psychiatrist who recommended patient continue to stabilize in the ED, and return to group home when psychiatrically stable.   CSW attempted group home at numbers provided, however not sure if this is a correct number.  CSW called and got another number [phone].   Pt guardian, [facility] to compare current medications.     Byrd HesselbachKristen Ericson Nafziger, LCSW [phone]  ED CSW 06/08/2014 1144am

## 2014-06-08 NOTE — Progress Notes (Signed)
Pt alert, disorganized but cooperative. Affect/mood depressed and constricted. Pt has no insight and judgement is impaired. +A/vhall, frequently states someone is in her room and she is scared. Staff continuously provide redirection and reorientation. Emotional support and encouragement given. Will continue to monitor closely and evaluate for stabilization.

## 2014-06-08 NOTE — Progress Notes (Signed)
[facility] in front of [facility].  [facility], "I want to get the hell out of here! Why won't they let me go!"  [facility] to [facility] patient.  [facility] asked [facility], "Where do you want to go?" Patient yelled, "I want to go to my group home!!"  [facility] explained to patient that they are here to help her and will be happy to send her back to her group home, but it is too late at night to discharge her.  Patient stopped yelling.  BH tech escorted patient to day room.

## 2014-06-08 NOTE — ED Notes (Signed)
Brittany Velez showered and donned fresh scrubs/socks. She requested assistance. Although she appears physically able to manage her own shower, her disorganization limits her ability to follow through. Pt was unable to remember when she had showered last and asked why water "doesn't connect with" her as it does other people.

## 2014-06-08 NOTE — ED Notes (Signed)
Patient upset. Responding to internal stimuli, tearful and restless; pacing. Paranoid about going to her room. States "you can't make me go in there. I want to go home. Let me out. They made a big mistake". Encouragement offered. Patient taken to dayroom.

## 2014-06-08 NOTE — Consult Note (Signed)
[facility] Face-to-Face Psychiatry Consult   Reason for Consult:  Psychosis and Delusional Referring Physician:  EDP Patient Identification: Brittany Velez MRN:  [id number] Principal Diagnosis: Schizophrenia, schizoaffective, chronic with acute exacerbation Diagnosis:   Patient Active Problem List   Diagnosis Date Noted  . Schizophrenia, schizoaffective, chronic with acute exacerbation [F25.8] 06/06/2014  . Auditory hallucination [R44.0]   . Visual hallucination [R44.1]     Total Time spent with patient: 15 minutes  Subjective:   Brittany Velez is a 29 y.o. female patient admitted with delusion and psychosis.   HPI: Brittany Velez is a 29 yo [place] female who was brought in under IVC from her group home for delusional thoughts and auditory hallucinations.  Patient states "the police brought me here from the group home for bad behavior." She is seen today in the Emergency Department. She is alert, calm and cooperative. She is undoing her braided hair "because my spirit doesn't like it." She reports she does not like being alone and does not want to "be put any where dark." She continues to talk about "dancing with Donn Pierini" at the [place] next month.  She denies SI/HI. She is responding to auditory hallucinations.   Above note reviewed with updates added.  Patient is verbalizing much better today and was seen in front of the nursing station asking for drinks.  Patient, however stated that she is rich and plans to attend the [employer].  Patient reported that Donn Pierini is coming from the wall behind her TV to take her to the award show.   This seems to be her base line, we plan a discharge as soon as patient maintains stability and is taking her medications.  HPI Elements:    Location: Mood (Schizophrenia) Quality: Severe Severity: Severe Timing: Acute Duration: Chronic Mental Illness Context: psychosis, delusion - brought in from group home for evaluation  Past Medical  History:  Past Medical History  Diagnosis Date  . Schizo-affective schizophrenia   . Retardation    History reviewed. No pertinent past surgical history. Family History: History reviewed. No pertinent family history. Social History:  History  Alcohol Use No     History  Drug Use No    History   Social History  . Marital Status: Single    Spouse Name: N/A    Number of Children: N/A  . Years of Education: N/A   Social History Main Topics  . Smoking status: Current Some Day Smoker  . Smokeless tobacco: Never Used  . Alcohol Use: No  . Drug Use: No  . Sexual Activity: Yes   Other Topics Concern  . None   Social History Narrative   ROS Additional Social History:    History of alcohol / drug use?: No history of alcohol / drug abuse  Allergies:  No Known Allergies  Vitals: Blood pressure 103/59, pulse 84, temperature 98.1 F (36.7 C), temperature source Oral, resp. rate 14, SpO2 99 %.  Risk to Self: Suicidal Ideation: No Suicidal Intent: No Is patient at risk for suicide?: No Suicidal Plan?: No Access to Means: No What has been your use of drugs/alcohol within the last 12 months?: No alcohol or drug use reported.  How many times?: 0 Other Self Harm Risks: It has been reported that pt has been banging her head on the wall.  Triggers for Past Attempts: None known Intentional Self Injurious Behavior:  (Head banging ) Risk to Others: Homicidal Ideation: No Thoughts of Harm to Others: No Current Homicidal Intent: No  Current Homicidal Plan: No Access to Homicidal Means: No Identified Victim: NA (NA) History of harm to others?: No Assessment of Violence: On admission Violent Behavior Description: No violent behaviors observed at this time. Pt is calm and cooperative.  Does patient have access to weapons?: No Criminal Charges Pending?: No Does patient have a court date: No Prior Inpatient Therapy: Prior Inpatient Therapy: Yes Prior Therapy Dates: 2008 Prior  Therapy Facilty/Provider(s): [facility]  Reason for Treatment: Schizoaffective Prior Outpatient Therapy: Prior Outpatient Therapy: Yes Prior Therapy Dates: 2015 Prior Therapy Facilty/Provider(s): Provider name unknown at this time. [facility]  Reason for Treatment: Schizoaffective disorder  Current Facility-Administered Medications  Medication Dose Route Frequency Provider Last Rate Last Dose  . acetaminophen (TYLENOL) tablet 650 mg  650 mg Oral Q4H PRN Emilia Beck, PA-C      . alum & mag hydroxide-simeth (MAALOX/MYLANTA) 200-200-20 MG/5ML suspension 30 mL  30 mL Oral PRN Emilia Beck, PA-C      . benztropine (COGENTIN) tablet 1 mg  1 mg Oral BID Kristeen Mans, NP   1 mg at 06/08/14 0910  . clonazePAM (KLONOPIN) tablet 1 mg  1 mg Oral BID Kristeen Mans, NP   1 mg at 06/08/14 0910  . ferrous sulfate tablet 325 mg  325 mg Oral Daily Kristeen Mans, NP   325 mg at 06/08/14 0910  . haloperidol (HALDOL) 2 MG/ML solution 10 mg  10 mg Oral BID Kristeen Mans, NP   10 mg at 06/08/14 0914  . ibuprofen (ADVIL,MOTRIN) tablet 600 mg  600 mg Oral Q8H PRN Emilia Beck, PA-C      . LORazepam (ATIVAN) tablet 1 mg  1 mg Oral Q8H PRN Emilia Beck, PA-C   1 mg at 06/08/14 1212  . ondansetron (ZOFRAN) tablet 4 mg  4 mg Oral Q8H PRN Emilia Beck, PA-C      . potassium chloride (K-DUR) CR tablet 10 mEq  10 mEq Oral Daily Kristeen Mans, NP   10 mEq at 06/08/14 0910  . traZODone (DESYREL) tablet 300 mg  300 mg Oral QHS Kristeen Mans, NP   300 mg at 06/07/14 2116  . Valproic Acid (DEPAKENE) 250 MG/5ML syrup SYRP 1,000 mg  1,000 mg Oral QHS Kristeen Mans, NP   1,000 mg at 06/07/14 2117  . Valproic Acid (DEPAKENE) 250 MG/5ML syrup SYRP 500 mg  500 mg Oral Daily Kristeen Mans, NP   500 mg at 06/08/14 4098   Current Outpatient Prescriptions  Medication Sig Dispense Refill  . acetaminophen (TYLENOL) 500 MG tablet Take 500 mg by mouth every 6 (six) hours as needed (for pain.).    Marland Kitchen benztropine  (COGENTIN) 1 MG tablet Take 1 mg by mouth 2 (two) times daily.    . Cholecalciferol (VITAMIN D3) 2000 UNITS TABS Take 2,000 Units by mouth daily.    . clonazePAM (KLONOPIN) 1 MG tablet Take 1 mg by mouth 2 (two) times daily.    . ferrous sulfate 325 (65 FE) MG tablet Take 325 mg by mouth daily.    . haloperidol (HALDOL) 2 MG/ML solution Take 10 mg by mouth 2 (two) times daily.    . potassium chloride (K-DUR) 10 MEQ tablet Take 10 mEq by mouth daily.    . trazodone (DESYREL) 300 MG tablet Take 300 mg by mouth at bedtime.    . valproate (DEPAKENE) 250 MG/5ML syrup Take 500-1,000 mg by mouth 2 (two) times daily. 500 mg in the morning and 1000 mg  at bedtime.      Musculoskeletal: Strength & Muscle Tone: unable to assess, was seen lying down in bed Gait & Station: assessed in bed lying down Patient leans: assesssed in bed   Psychiatric Specialty Exam:     Blood pressure 103/59, pulse 84, temperature 98.1 F (36.7 C), temperature source Oral, resp. rate 14, SpO2 99 %.There is no height or weight on file to calculate BMI.  General Appearance: Casual and Disheveled  Eye Contact::  Poor  Speech:  Blocked and Slow  Volume:  Decreased  Mood:  Dysphoric  Affect:  Blunt and Constricted  Thought Process:  Circumstantial, Disorganized, Linear and Tangential  Orientation:  Full (Time, Place, and Person)  Thought Content:  Delusions and Hallucinations: Auditory Visual  Suicidal Thoughts:  No  Homicidal Thoughts:  No  Memory:  Unable to assess at this time  Judgement:  Impaired  Insight:  Shallow  Psychomotor Activity:  Normal  Concentration:  Poor  Recall:  NA  Fund of Knowledge:Poor  Language: Poor  Akathisia:  NA  Handed:  Right  AIMS (if indicated):     Assets:  Desire for Improvement  ADL's:  Impaired  Cognition: Impaired,  Severe  Sleep:      Medical Decision Making: Self-Limited or Minor (1), Review of Psycho-Social Stressors (1), Review or order clinical lab tests (1), Decision  to obtain old records (1), Review and summation of old records (2), Established Problem, Worsening (2), Review of Medication Regimen & Side Effects (2) and Review of New Medication or Change in Dosage (2)  Problem Points: Established problem, worsening (2) and Review of psycho-social stressors (1)  Data Points: Decision to obtain old records (1) Review of medication regiment & side effects (2) Review of new medications or change in dosage (2)  Treatment Plan Summary: -Daily contact with patient to assess and evaluate symptoms and progress in treatment -Medication management: home medications have been restarted -Seek inpatient psychiatric placement  Goal is to stabilize patient here if no placement is available and send her back to her facility.  Plan:  Recommend psychiatric Inpatient admission when medically cleared.   Disposition: Patient currently under review by some facilities.  We are also treating and managing patient with plan to send her back to her family care home.  Dahlia ByesJosephine Onuoha,  PMHNP-BC 06/08/2014 02:32 Patient seen, evaluated and I agree with notes by Nurse Practitioner. Thedore MinsMojeed Adine Heimann, MD

## 2014-06-08 NOTE — ED Notes (Signed)
Introduced self to pt and encouraged her to verbalize needs. She was cooperative and denied SI/HI. Speech was difficult to understand. She asked, "When am I going to get my clothes back?" Explained rules and provided additional blanket. Pt appeared to be anxious but not acutely distressed. Will continue to monitor for needs and safety.

## 2014-06-08 NOTE — ED Notes (Signed)
She denies AVH but appears to be responding to internal stimuli. She remains pleasant thus far.

## 2014-06-08 NOTE — ED Notes (Signed)
Patient anxious, agitated; pacing, tremulous. Reports that she wants to go home. Communicating with internal stimuli. Patient reminded that she will be informed of updates to her treatment plan as they are made. Patient encouraged, reminded that she is safe at the hospital. Patient yelling "let me out. I want to go home". Patient redirected. Allowed to sit in dayroom. Ordered medications provided.   Q 15 safety checks continue.

## 2014-06-08 NOTE — ED Notes (Signed)
Patient anxious, appears sad. Patient reports SI. Responding to internal stimuli. Patient is tremulous, worried about being able to go back to her group home. Feeling abandoned, reports "They left me here all by myself". States sleep has been poor. Reports difficulty staying asleep.   Encouragement offered. Ativan given.  Q 15 safety checks continue.

## 2014-06-08 NOTE — ED Notes (Signed)
Ms. Brittany Velez says that there is a "bomb in my stomach" and says it's going to go off. "I'm going to explode." She urged this Clinical research associatewriter to listen in her ear for ticking. Provided pt with 1 mg Ativan PRN and reassurance. Pt is afraid at times to be in room.

## 2014-06-08 NOTE — Progress Notes (Signed)
[facility] [address] [place], [place] [address] [phone]

## 2014-06-09 NOTE — [facility] Suicide Risk Assessment (Cosign Needed)
Suicide Risk Assessment  Discharge Assessment   [facility]   Demographic Factors:  Low socioeconomic status and Unemployed  Total Time spent with patient: 30 minutes  Musculoskeletal: Strength & Muscle Tone: within normal limits Gait & Station: normal Patient leans: N/A  Psychiatric Specialty Exam:     Blood pressure 111/55, pulse 78, temperature 98.1 F (36.7 C), temperature source Oral, resp. rate 18, SpO2 99 %.There is no height or weight on file to calculate BMI.  General Appearance: Casual  Eye Contact::  Good  Speech:  Clear and Coherent and Normal Rate409  Volume:  Normal  Mood:  Depressed  Affect:  Congruent  Thought Process:  Coherent and Linear  Orientation:  Full (Time, Place, and Person)  Thought Content:  Delusions  Suicidal Thoughts:  No  Homicidal Thoughts:  No  Memory:  Immediate;   Fair Recent;   Fair Remote;   Fair  Judgement:  Fair  Insight:  Shallow  Psychomotor Activity:  Normal  Concentration:  Fair  Recall:  NA  Fund of Knowledge:Fair  Language: Good  Akathisia:  NA  Handed:  Right  AIMS (if indicated):     Assets:  Desire for Improvement  Sleep:     Cognition: Impaired,  Moderate  ADL's:  Intact      Has this patient used any form of tobacco in the last 30 days? (Cigarettes, Smokeless Tobacco, Cigars, and/or Pipes) No  Mental Status Per Nursing Assessment::   On Admission:     Current Mental Status by Physician: NA  Loss Factors: NA  Historical Factors: NA  Risk Reduction Factors:   Living with another person, especially a relative and Positive social support  Continued Clinical Symptoms:  Schizophrenia:   Paranoid or undifferentiated type  Cognitive Features That Contribute To Risk:  Closed-mindedness, Polarized thinking and Thought constriction (tunnel vision)    Suicide Risk:  Minimal: No identifiable suicidal ideation.  Patients presenting with no risk factors but with morbid ruminations;  may be classified as minimal risk based on the severity of the depressive symptoms  Principal Problem: Schizophrenia, schizoaffective, chronic with acute exacerbation Discharge Diagnoses:  Patient Active Problem List   Diagnosis Date Noted  . Schizophrenia, schizoaffective, chronic with acute exacerbation [F25.8] 06/06/2014  . Auditory hallucination [R44.0]   . Visual hallucination [R44.1]       Plan Of Care/Follow-up recommendations:  Activity:  AS TOLERATED Diet:  REGULAR  Is patient on multiple antipsychotic therapies at discharge:  No   Has Patient had three or more failed trials of antipsychotic monotherapy by history:  No  Recommended Plan for Multiple Antipsychotic Therapies: NA    Stewart Pimenta, C   PMHNP-BC 06/09/2014, 11:31 AM

## 2014-06-09 NOTE — Progress Notes (Signed)
Per psychiatrist and NP, patient is psychiatrically stable to [facility]. CSW called Cyndi Lennertonya Jones at [phone] to discuss discharge. CSW left message.   CSW contacted guardian, [employer] at [phone] regarding discharge. CSW spoke with Cassandra from [employer] who stated she will get cell number for Cyndi Lennertonya Jones as well.   Byrd HesselbachKristen Gohan Collister, LCSW [phone]  ED CSW 06/09/2014 10:38 AM

## 2014-06-09 NOTE — Progress Notes (Addendum)
CSW spoke with pt guaridan, [employer], and spoke with Elonda HuskyCassandra on the crisis line [phone]. Cassandra called Cyndi Lennertonya Jones from [facility] and left another message while csw was on the phone. Pt guardian also emailed provider and copied this csw on the email. CSW awaiting call back at [phone] from guardian or assisted living provider regarding pt transportation home.   Byrd HesselbachKristen Keron Neenan, LCSW [phone]  ED CSW 06/09/2014 1:20 PM   CSW called guardian who provided number [phone] and no answer left message.  Group home address per guardian [address] [place], [place] [address]. IF group home does not reutrn call by 4pm, non emergency police to be sent to group home to complete safety check and have group home call hospital. It may be that staff is currently at Day program with residents at this time.   Byrd HesselbachKristen Ilisha Blust, LCSW [phone]  ED CSW 06/09/2014 2:02 PM

## 2014-06-09 NOTE — Progress Notes (Signed)
CSW reached out to [facility]Tonya Jones of [facility] to inform her that the patient has been psychiatrically cleared and ready for discharge. CSW informed Yetta BarreJones that patient is ready to be picked up. Representative informed CSW that the patient is not welcomed back and that she feels the patient's medicines should be changed.   CSW reached out to patient's guardian to inform her that the patient's group home is refusing to accept the patient back. According to the guardian, she has also spoken with Cyndi Lennertonya Jones and informed her that it is not appropriate for her to deny the patient.   Owner of group home reached out to CSW and states that she will not take the patient back. CSW made it clear to owner that it is illegal not to accept the patient back. CSW also made group home owner aware that patient is indeed psychiatrically clear and ready to return to group home. Group home owner admits that there are no charges taken against the patient and that she has not filed a 30 day eviction notice.   At this time owner and representative Cyndi Lennertonya Jones are stating that the patient is not welcomed back.  CSW will call and make a complaint against the [facility] once the hours of service are open to make a report.   CSW made EDP aware that patient is currently under IVC and that the order would need to be rescinded once it is certain that the patient will be leaving [facility].   Tonya Jones/Representative of [facility] [phone]  Trish MageBrittney Kadeshia Kasparian, LCSWA [phone] ED CSW 06/09/2014 9:09 PM

## 2014-06-09 NOTE — ED Notes (Signed)
Attempted shower, patient became paranoid and felt water was too cold.

## 2014-06-09 NOTE — Consult Note (Signed)
[facility]   Reason for Consult:  Psychosis and Delusional Referring Physician:  EDP Patient Identification: Brittany FellerLinda Velez MRN:  [id number] Principal Diagnosis: Schizophrenia, schizoaffective, chronic with acute exacerbation Diagnosis:   Patient Active Problem List   Diagnosis Date Noted  . Schizophrenia, schizoaffective, chronic with acute exacerbation [F25.8] 06/06/2014  . Auditory hallucination [R44.0]   . Visual hallucination [R44.1]     Total Time spent with patient: 15 minutes  Subjective:   Brittany Velez is a 29 y.o. female patient admitted with delusion and psychosis.   HPI: Brittany Velez is a 29 yo [place] female who was brought in under IVC from her group home for delusional thoughts and auditory hallucinations.  Patient states "the police brought me here from the group home for bad behavior." She is seen today in the Emergency Department. She is alert, calm and cooperative. She is undoing her braided hair "because my spirit doesn't like it." She reports she does not like being alone and does not want to "be put any where dark." She continues to talk about "dancing with [name]" [place].  She denies SI/HI. She is responding to auditory hallucinations.   Above note reviewed with updates added.  Patient is verbalizing much better today and was seen in front of the nursing station asking for drinks.  Patient, however stated that she is rich and plans to attend the [employer].  Patient reported that [name] is coming from the wall behind her TV to take her to the award show.   This seems to be her base line, we plan a discharge as soon as patient maintains stability and is taking her medications.  Patient is being discharged back to her assisted living facility.  Patient has improved and have been taking care of her ADL.  Patient understands that she will be having some delusion of grandeur and will continue to take her  medications.  Patient  Will follow up with her outpatient.  Patient denies SI/HI at this time but believes she is rich and will be attending the [employer] award ceremony.  HPI Elements:    Location: Mood (Schizophrenia) Quality: Severe Severity: Severe Timing: Acute Duration: Chronic Mental Illness Context: psychosis, delusion - brought in from group home for evaluation  Past Medical History:  Past Medical History  Diagnosis Date  . Schizo-affective schizophrenia   . Retardation    History reviewed. No pertinent past surgical history. Family History: History reviewed. No pertinent family history. Social History:  History  Alcohol Use No     History  Drug Use No    History   Social History  . Marital Status: Single    Spouse Name: N/A    Number of Children: N/A  . Years of Education: N/A   Social History Main Topics  . Smoking status: Current Some Day Smoker  . Smokeless tobacco: Never Used  . Alcohol Use: No  . Drug Use: No  . Sexual Activity: Yes   Other Topics Concern  . None   Social History Narrative   ROS Additional Social History:    History of alcohol / drug use?: No history of alcohol / drug abuse  Allergies:  No Known Allergies  Vitals: Blood pressure 111/55, pulse 78, temperature 98.1 F (36.7 C), temperature source Oral, resp. rate 18, SpO2 99 %.  Risk to Self: Suicidal Ideation: No Suicidal Intent: No Is patient at risk for suicide?: No Suicidal Plan?: No Access to Means: No What  has been your use of drugs/alcohol within the last 12 months?: No alcohol or drug use reported.  How many times?: 0 Other Self Harm Risks: It has been reported that pt has been banging her head on the wall.  Triggers for Past Attempts: None known Intentional Self Injurious Behavior:  (Head banging ) Risk to Others: Homicidal Ideation: No Thoughts of Harm to Others: No Current Homicidal Intent: No Current Homicidal Plan: No Access to Homicidal Means: No Identified  Victim: NA (NA) History of harm to others?: No Assessment of Violence: On admission Violent Behavior Description: No violent behaviors observed at this time. Pt is calm and cooperative.  Does patient have access to weapons?: No Criminal Charges Pending?: No Does patient have a court date: No Prior Inpatient Therapy: Prior Inpatient Therapy: Yes Prior Therapy Dates: 2008 Prior Therapy Facilty/Provider(s): [facility]  Reason for Treatment: Schizoaffective Prior Outpatient Therapy: Prior Outpatient Therapy: Yes Prior Therapy Dates: 2015 Prior Therapy Facilty/Provider(s): Provider name unknown at this time. [facility]  Reason for Treatment: Schizoaffective disorder  Current Facility-Administered Medications  Medication Dose Route Frequency Provider Last Rate Last Dose  . acetaminophen (TYLENOL) tablet 650 mg  650 mg Oral Q4H PRN Emilia Beck, PA-C   650 mg at 06/08/14 1736  . alum & mag hydroxide-simeth (MAALOX/MYLANTA) 200-200-20 MG/5ML suspension 30 mL  30 mL Oral PRN Emilia Beck, PA-C      . benztropine (COGENTIN) tablet 1 mg  1 mg Oral BID Kristeen Mans, NP   1 mg at 06/09/14 0920  . clonazePAM (KLONOPIN) tablet 1 mg  1 mg Oral BID Kristeen Mans, NP   1 mg at 06/09/14 0920  . ferrous sulfate tablet 325 mg  325 mg Oral Daily Kristeen Mans, NP   325 mg at 06/09/14 0920  . haloperidol (HALDOL) 2 MG/ML solution 10 mg  10 mg Oral BID Kristeen Mans, NP   10 mg at 06/09/14 0926  . ibuprofen (ADVIL,MOTRIN) tablet 600 mg  600 mg Oral Q8H PRN Emilia Beck, PA-C      . LORazepam (ATIVAN) tablet 1 mg  1 mg Oral Q8H PRN Emilia Beck, PA-C   1 mg at 06/08/14 1946  . ondansetron (ZOFRAN) tablet 4 mg  4 mg Oral Q8H PRN Emilia Beck, PA-C      . potassium chloride (K-DUR) CR tablet 10 mEq  10 mEq Oral Daily Kristeen Mans, NP   10 mEq at 06/09/14 0920  . traZODone (DESYREL) tablet 300 mg  300 mg Oral QHS Kristeen Mans, NP   300 mg at 06/08/14 2156  . Valproic Acid (DEPAKENE) 250  MG/5ML syrup SYRP 1,000 mg  1,000 mg Oral QHS Kristeen Mans, NP   1,000 mg at 06/08/14 2154  . Valproic Acid (DEPAKENE) 250 MG/5ML syrup SYRP 500 mg  500 mg Oral Daily Kristeen Mans, NP   500 mg at 06/09/14 4098   Current Outpatient Prescriptions  Medication Sig Dispense Refill  . acetaminophen (TYLENOL) 500 MG tablet Take 500 mg by mouth every 6 (six) hours as needed (for pain.).    Marland Kitchen benztropine (COGENTIN) 1 MG tablet Take 1 mg by mouth 2 (two) times daily.    . Cholecalciferol (VITAMIN D3) 2000 UNITS TABS Take 2,000 Units by mouth daily.    . clonazePAM (KLONOPIN) 1 MG tablet Take 1 mg by mouth 2 (two) times daily.    . ferrous sulfate 325 (65 FE) MG tablet Take 325 mg by mouth daily.    Marland Kitchen  haloperidol (HALDOL) 2 MG/ML solution Take 10 mg by mouth 2 (two) times daily.    . potassium chloride (K-DUR) 10 MEQ tablet Take 10 mEq by mouth daily.    . trazodone (DESYREL) 300 MG tablet Take 300 mg by mouth at bedtime.    . valproate (DEPAKENE) 250 MG/5ML syrup Take 500-1,000 mg by mouth 2 (two) times daily. 500 mg in the morning and 1000 mg at bedtime.      Musculoskeletal: Strength & Muscle Tone: within normal limits Gait & Station: normal Patient leans: N/A  Psychiatric Specialty Exam:     Blood pressure 111/55, pulse 78, temperature 98.1 F (36.7 C), temperature source Oral, resp. rate 18, SpO2 99 %.There is no height or weight on file to calculate BMI.  General Appearance: Casual  Eye Contact::  Good  Speech:  Clear and Coherent and Slow  Volume:  Decreased  Mood:  Depressed  Affect:  Blunt and Constricted  Thought Process:  Coherent and Linear  Orientation:  Full (Time, Place, and Person)  Thought Content:  Hallucinations: Auditory Visual  Suicidal Thoughts:  No  Homicidal Thoughts:  No  Memory:  Unable to assess at this time  Judgement:  Fair  Insight:  Shallow  Psychomotor Activity:  Normal  Concentration:  Fair  Recall:  NA  Fund of Knowledge:Fair  Language: Good   Akathisia:  NA  Handed:  Right  AIMS (if indicated):     Assets:  Desire for Improvement  ADL's:  Intact  Cognition: Impaired,  Mild  Sleep:      Medical Decision Making: Established Problem, Stable/Improving (1), Review and summation of old records (2), Review of Medication Regimen & Side Effects (2) and Review of New Medication or Change in Dosage (2)  Problem Points: Established problem, stable/improving (1) and Review of psycho-social stressors (1)  Data Points: Review of medication regiment & side effects (2) Review of new medications or change in dosage (2)  Treatment Plan Summary: Discharge back to [facility] assisted [facility]  Plan:  Discharge back to her assisted home, [facility]  Disposition: Patient is being dischaged back to her assisted living facility .  Dahlia Byes,  PMHNP-BC 06/09/2014 11:03  Patient seen, evaluated and I agree with notes by Nurse Practitioner. Thedore Mins, MD

## 2014-06-09 NOTE — ED Notes (Signed)
Patient anxious, tearful; ruminates on going home and feeling scared. Denies SI, HI. Responding to internal stimuli. Speaking to writer as "Bonita QuinLinda" and as "Jonel's mom".   Encouragement offered.   Q 15 safety checks continue.

## 2014-06-09 NOTE — ED Notes (Signed)
Ms. Brittany Velez remains very disorganized and at times difficult to understand. She is responding to internal stimuli and still says she is afraid to stay in her room. She tearfully states, "[place]," and says she wants to go back to her group home.

## 2014-06-10 MED ORDER — OLANZAPINE 10 MG PO TBDP
10.0000 mg | ORAL_TABLET | Freq: Once | ORAL | Status: AC
  Administered 2014-06-10: 10 mg via ORAL
  Filled 2014-06-10: qty 1

## 2014-06-10 NOTE — Progress Notes (Addendum)
CSW spoke with Brittany Velez. Pt guardian able to get [facility] [place] [facility] for [facility] [place]. Patient admitted on 06/05/2013, psychiatrist and NP are not recommending any medication changes from the ED at this time. Brittany Velez requesting a prn to be added [place]. CSW will inform psychiatrist of request. Pt dc summary to be faxed to pt guardian, [employer] to [phone].   Byrd HesselbachKristen Haziel Molner, LCSW [phone]  ED CSW 06/10/2014 10:03 AM  Per discussion with psychiatrist and NP, a prn will be scheduled closer to discahrge

## 2014-06-10 NOTE — BH Assessment (Signed)
[facility] Assessment Progress Note  Per Thedore MinsMojeed Akintayo, MD, pt's IVC is to be rescinded.  Appropriate paperwork has been completed.  Doylene Canninghomas Angeliyah Kirkey, MA Triage Specialist 06/10/2014 @ 14:37

## 2014-06-10 NOTE — Progress Notes (Signed)
Patient ID: Brittany FellerLinda Velez, female   DOB: June 14, 1985, 29 y.o.   MRN: [id number] Patient was discharged yesterday but was not picked up by her assisted living facility.  Today they have promised to come and take patient home.  Patient remains calm and cooperative.  Patient is alert and oriented to self and place but delusional.  Patient stated that she is rich and should be living in a big home.  Patient should be living as soon as the staff from the facility comes with transportation.  She denies SI/HI/AVH.  Brittany Velez    PMHNP-BC Patient seen, evaluated and I agree with notes by Nurse Practitioner. Thedore MinsMojeed Jaysion Ramseyer, MD

## 2014-06-10 NOTE — Consult Note (Signed)
[facility]   Reason for Consult:  Psychosis and Delusional Referring Physician:  EDP Patient Identification: Brittany FellerLinda Velez MRN:  [id number] Principal Diagnosis: Schizophrenia, schizoaffective, chronic with acute exacerbation Diagnosis:   Patient Active Problem List   Diagnosis Date Noted  . Schizophrenia, schizoaffective, chronic with acute exacerbation [F25.8] 06/06/2014  . Auditory hallucination [R44.0]   . Visual hallucination [R44.1]     Total Time spent with patient: 15 minutes  Subjective:   Brittany FellerLinda Velez is a 29 y.o. female patient admitted with delusion and psychosis.   HPI: Brittany Velez is a 29 yo [place] female who was brought in under IVC from her group home for delusional thoughts and auditory hallucinations.  Patient states "the police brought me here from the group home for bad behavior." She is seen today in the Emergency Department. She is alert, calm and cooperative. She is undoing her braided hair "because my spirit doesn't like it." She reports she does not like being alone and does not want to "be put any where dark." She continues to talk about "dancing with Brittany Velez" [place] the [place] next [place].  She denies SI/HI. She is responding to auditory hallucinations.   Above note reviewed with updates added.  Patient is verbalizing much better today and was seen in front of the nursing station asking for drinks.  Patient, however stated that she is rich and plans to attend the [employer].  Patient reported that Brittany Velez is coming from the wall behind her TV to take her to the award show.   This seems to be her base line, we plan a discharge as soon as patient maintains stability and is taking her medications.  Patient is being discharged back to her assisted living facility.  Patient has improved and have been taking care of her ADL.  Patient understands that she will be having some delusion of grandeur and will continue to take her  medications.  Patient  Will follow up with her outpatient.  Patient denies SI/HI at this time but believes she is rich and will be attending the [employer] award ceremony.  Patient was not picked up by his group home yesterday after her discharge.  We have been calling and reminding staff of her group home that patient is waiting for transportation back to the home.  Patient denies SI/HI/AVH.  Patient remains calm and cooperative.  Patient is taking her medications.  Patient should be leaving as soon as staff from group home are here.  HPI Elements:    Location: Mood (Schizophrenia) Quality: Severe Severity: Severe Timing: Acute Duration: Chronic Mental Illness Context: psychosis, delusion - brought in from group home for evaluation  Past Medical History:  Past Medical History  Diagnosis Date  . Schizo-affective schizophrenia   . Retardation    History reviewed. No pertinent past surgical history. Family History: History reviewed. No pertinent family history. Social History:  History  Alcohol Use No     History  Drug Use No    History   Social History  . Marital Status: Single    Spouse Name: N/A    Number of Children: N/A  . Years of Education: N/A   Social History Main Topics  . Smoking status: Current Some Day Smoker  . Smokeless tobacco: Never Used  . Alcohol Use: No  . Drug Use: No  . Sexual Activity: Yes   Other Topics Concern  . None   Social History Narrative   ROS Additional Social History:  History of alcohol / drug use?: No history of alcohol / drug abuse  Allergies:  No Known Allergies  Vitals: Blood pressure 94/46, pulse 68, temperature 97.7 F (36.5 C), temperature source Oral, resp. rate 20, SpO2 100 %.  Risk to Self: Suicidal Ideation: No Suicidal Intent: No Is patient at risk for suicide?: No Suicidal Plan?: No Access to Means: No What has been your use of drugs/alcohol within the last 12 months?: No alcohol or drug use reported.  How many  times?: 0 Other Self Harm Risks: It has been reported that pt has been banging her head on the wall.  Triggers for Past Attempts: None known Intentional Self Injurious Behavior:  (Head banging ) Risk to Others: Homicidal Ideation: No Thoughts of Harm to Others: No Current Homicidal Intent: No Current Homicidal Plan: No Access to Homicidal Means: No Identified Victim: NA (NA) History of harm to others?: No Assessment of Violence: On admission Violent Behavior Description: No violent behaviors observed at this time. Pt is calm and cooperative.  Does patient have access to weapons?: No Criminal Charges Pending?: No Does patient have a court date: No Prior Inpatient Therapy: Prior Inpatient Therapy: Yes Prior Therapy Dates: 2008 Prior Therapy Facilty/Provider(s): [facility]  Reason for Treatment: Schizoaffective Prior Outpatient Therapy: Prior Outpatient Therapy: Yes Prior Therapy Dates: 2015 Prior Therapy Facilty/Provider(s): Provider name unknown at this time. [facility]  Reason for Treatment: Schizoaffective disorder  Current Facility-Administered Medications  Medication Dose Route Frequency Provider Last Rate Last Dose  . acetaminophen (TYLENOL) tablet 650 mg  650 mg Oral Q4H PRN Emilia Beck, PA-C   650 mg at 06/08/14 1736  . alum & mag hydroxide-simeth (MAALOX/MYLANTA) 200-200-20 MG/5ML suspension 30 mL  30 mL Oral PRN Emilia Beck, PA-C      . benztropine (COGENTIN) tablet 1 mg  1 mg Oral BID Kristeen Mans, NP   1 mg at 06/10/14 0934  . clonazePAM (KLONOPIN) tablet 1 mg  1 mg Oral BID Kristeen Mans, NP   1 mg at 06/10/14 0934  . ferrous sulfate tablet 325 mg  325 mg Oral Daily Kristeen Mans, NP   325 mg at 06/10/14 0934  . haloperidol (HALDOL) 2 MG/ML solution 10 mg  10 mg Oral BID Kristeen Mans, NP   10 mg at 06/10/14 0934  . ibuprofen (ADVIL,MOTRIN) tablet 600 mg  600 mg Oral Q8H PRN Emilia Beck, PA-C      . LORazepam (ATIVAN) tablet 1 mg  1 mg Oral Q8H PRN Emilia Beck, PA-C   1 mg at 06/08/14 1946  . ondansetron (ZOFRAN) tablet 4 mg  4 mg Oral Q8H PRN Emilia Beck, PA-C      . potassium chloride (K-DUR) CR tablet 10 mEq  10 mEq Oral Daily Kristeen Mans, NP   10 mEq at 06/10/14 0934  . traZODone (DESYREL) tablet 300 mg  300 mg Oral QHS Kristeen Mans, NP   300 mg at 06/09/14 2210  . Valproic Acid (DEPAKENE) 250 MG/5ML syrup SYRP 1,000 mg  1,000 mg Oral QHS Kristeen Mans, NP   1,000 mg at 06/09/14 2220  . Valproic Acid (DEPAKENE) 250 MG/5ML syrup SYRP 500 mg  500 mg Oral Daily Kristeen Mans, NP   500 mg at 06/10/14 1610   Current Outpatient Prescriptions  Medication Sig Dispense Refill  . acetaminophen (TYLENOL) 500 MG tablet Take 500 mg by mouth every 6 (six) hours as needed (for pain.).    Marland Kitchen benztropine (COGENTIN)  1 MG tablet Take 1 mg by mouth 2 (two) times daily.    . Cholecalciferol (VITAMIN D3) 2000 UNITS TABS Take 2,000 Units by mouth daily.    . clonazePAM (KLONOPIN) 1 MG tablet Take 1 mg by mouth 2 (two) times daily.    . ferrous sulfate 325 (65 FE) MG tablet Take 325 mg by mouth daily.    . haloperidol (HALDOL) 2 MG/ML solution Take 10 mg by mouth 2 (two) times daily.    . potassium chloride (K-DUR) 10 MEQ tablet Take 10 mEq by mouth daily.    . trazodone (DESYREL) 300 MG tablet Take 300 mg by mouth at bedtime.    . valproate (DEPAKENE) 250 MG/5ML syrup Take 500-1,000 mg by mouth 2 (two) times daily. 500 mg in the morning and 1000 mg at bedtime.      Musculoskeletal: Strength & Muscle Tone: within normal limits Gait & Station: normal Patient leans: N/A  Psychiatric Specialty Exam:     Blood pressure 94/46, pulse 68, temperature 97.7 F (36.5 C), temperature source Oral, resp. rate 20, SpO2 100 %.There is no height or weight on file to calculate BMI.  General Appearance: Casual  Eye Contact::  Good  Speech:  Clear and Coherent and Slow  Volume:  Decreased  Mood:  Depressed  Affect:  Blunt and Constricted  Thought Process:   Coherent and Linear  Orientation:  Full (Time, Place, and Person)  Thought Content:  Hallucinations: Auditory Visual  Suicidal Thoughts:  No  Homicidal Thoughts:  No  Memory:  Unable to assess at this time  Judgement:  Fair  Insight:  Shallow  Psychomotor Activity:  Normal  Concentration:  Fair  Recall:  NA  Fund of Knowledge:Fair  Language: Good  Akathisia:  NA  Handed:  Right  AIMS (if indicated):     Assets:  Desire for Improvement  ADL's:  Intact  Cognition: Impaired,  Mild  Sleep:      Medical Decision Making: Established Problem, Stable/Improving (1), Review and summation of old records (2), Review of Medication Regimen & Side Effects (2) and Review of New Medication or Change in Dosage (2)  Problem Points: Established problem, stable/improving (1) and Review of psycho-social stressors (1)  Data Points: Review of medication regiment & side effects (2) Review of new medications or change in dosage (2)  Treatment Plan Summary: Discharge back to her assisted [facility]  Plan:  Discharge back to her assisted home, [facility]  Disposition: Patient is being dischaged back to her assisted living facility .  Dahlia Byes,  PMHNP-BC 06/10/2014 2:07  PM  Patient seen, evaluated and I agree with notes by Nurse Practitioner. Thedore Mins, MD

## 2014-06-10 NOTE — ED Notes (Signed)
Patient discharged back to group home.  All belongings returned.  Patient denies thoughts of harm to self or others.  She has been cooperative this shift.  She states she is looking forward to going back home.

## 2014-06-22 ENCOUNTER — Encounter (HOSPITAL_COMMUNITY): Payer: Self-pay

## 2014-06-22 ENCOUNTER — Emergency Department (HOSPITAL_COMMUNITY): Admission: EM | Admit: 2014-06-22 | Payer: Self-pay | Source: Home / Self Care

## 2014-06-22 ENCOUNTER — Emergency Department (HOSPITAL_COMMUNITY)
Admission: EM | Admit: 2014-06-22 | Discharge: 2014-06-22 | Disposition: A | Discharge status: Home / Self Care | Payer: MEDICAID | Attending: Emergency Medicine | Admitting: Emergency Medicine

## 2014-06-22 DIAGNOSIS — Z720 Tobacco use: Secondary | ICD-10-CM | POA: Diagnosis not present

## 2014-06-22 DIAGNOSIS — R441 Visual hallucinations: Secondary | ICD-10-CM | POA: Diagnosis not present

## 2014-06-22 DIAGNOSIS — Z79899 Other long term (current) drug therapy: Secondary | ICD-10-CM | POA: Diagnosis not present

## 2014-06-22 DIAGNOSIS — Z008 Encounter for other general examination: Secondary | ICD-10-CM

## 2014-06-22 DIAGNOSIS — Z046 Encounter for general psychiatric examination, requested by authority: Secondary | ICD-10-CM | POA: Diagnosis not present

## 2014-06-22 LAB — RAPID URINE DRUG SCREEN, HOSP PERFORMED
Amphetamines: NOT DETECTED
Barbiturates: NOT DETECTED
Benzodiazepines: POSITIVE — AB
Cocaine: NOT DETECTED
OPIATES: NOT DETECTED
Tetrahydrocannabinol: NOT DETECTED

## 2014-06-22 LAB — COMPREHENSIVE METABOLIC PANEL
ALBUMIN: 4 g/dL (ref 3.5–5.2)
ALT: 11 U/L (ref 0–35)
AST: 18 U/L (ref 0–37)
Alkaline Phosphatase: 41 U/L (ref 39–117)
Anion gap: 7 (ref 5–15)
BUN: 15 mg/dL (ref 6–23)
CALCIUM: 9.2 mg/dL (ref 8.4–10.5)
CO2: 27 mmol/L (ref 19–32)
Chloride: 108 mmol/L (ref 96–112)
Creatinine, Ser: 0.49 mg/dL — ABNORMAL LOW (ref 0.50–1.10)
GFR calc Af Amer: >90 mL/min (ref >90)
GFR calc non Af Amer: >90 mL/min (ref >90)
Glucose, Bld: 90 mg/dL (ref 70–99)
Potassium: 3.8 mmol/L (ref 3.5–5.1)
SODIUM: 142 mmol/L (ref 135–145)
TOTAL PROTEIN: 7.6 g/dL (ref 6.0–8.3)
Total Bilirubin: 0.3 mg/dL (ref 0.3–1.2)

## 2014-06-22 LAB — CBC
HEMATOCRIT: 33.5 % — AB (ref 36.0–46.0)
HEMOGLOBIN: 10 g/dL — AB (ref 12.0–15.0)
MCH: 29.9 pg (ref 26.0–34.0)
MCHC: 29.9 g/dL — ABNORMAL LOW (ref 30.0–36.0)
MCV: 100.3 fL — ABNORMAL HIGH (ref 78.0–100.0)
Platelets: 281 10*3/uL (ref 150–400)
RBC: 3.34 MIL/uL — ABNORMAL LOW (ref 3.87–5.11)
RDW: 14.6 % (ref 11.5–15.5)
WBC: 6.2 10*3/uL (ref 4.0–10.5)

## 2014-06-22 LAB — TSH: TSH: 0.498 u[IU]/mL (ref 0.350–4.500)

## 2014-06-22 LAB — ACETAMINOPHEN LEVEL: Acetaminophen (Tylenol), Serum: <10 ug/mL — ABNORMAL LOW (ref 10–30)

## 2014-06-22 LAB — ETHANOL: Alcohol, Ethyl (B): <5 mg/dL (ref 0–9)

## 2014-06-22 LAB — SALICYLATE LEVEL: Salicylate Lvl: <4 mg/dL (ref 2.8–20.0)

## 2014-06-22 LAB — VALPROIC ACID LEVEL: Valproic Acid Lvl: 39.7 ug/mL — ABNORMAL LOW (ref 50.0–100.0)

## 2014-06-22 MED ORDER — VALPROIC ACID 250 MG/5ML PO SYRP
1000.0000 mg | ORAL_SOLUTION | Freq: Once | ORAL | Status: AC
  Administered 2014-06-22: 1000 mg via ORAL
  Filled 2014-06-22: qty 20

## 2014-06-22 MED ORDER — VALPROATE SODIUM 250 MG/5ML PO SYRP: 500.0000 mg | ORAL_SOLUTION | Freq: Two times a day (BID) | ORAL | Status: DC

## 2014-06-22 NOTE — ED Notes (Signed)
[facility] and confirmed that they were received.  Additional copy given to officer.    [facility] [employer] officers.

## 2014-06-22 NOTE — Discharge Instructions (Signed)
Pt medically cleared to return to [facility]. It is important for her to receive her daily dose of depakote.  Medical Screening Exam A medical screening exam has been done. This exam helps find the cause of your problem and determines whether you need emergency treatment. Your exam has shown that you do not need emergency treatment at this point. It is safe for you to go to your caregiver's office or clinic for treatment. You should make an appointment today to see your caregiver as soon as he or she is available. Depending on your illness, your symptoms and condition can change over time. If your condition gets worse or you develop new or troubling symptoms before you see your caregiver, you should return to the emergency department for further evaluation.  Document Released: 06/07/2004 Document Revised: 07/23/2011 Document Reviewed: 01/17/2011 [facility] Patient Information 2015 [place], [place]. This information is not intended to replace advice given to you by your health care provider. Make sure you discuss any questions you have with your health care provider. Psychosis Psychosis refers to a severe lack of understanding with reality. During a psychotic episode, you are not able to think clearly. During a psychotic episode, your responses and emotions are inappropriate and do not coincide with what is actually happening. You often have false beliefs about what is happening or who you are (delusions), and you may see, hear, taste, smell, or feel things that are not present (hallucinations). Psychosis is usually a severe symptom of a very serious mental health (psychiatric) condition, but it can sometimes be the result of a medical condition. CAUSES   Psychiatric conditions, such as:  Schizophrenia.  Bipolar disorder.  Depression.  Personality disorders.  Alcohol or drug abuse.  Medical conditions, such as:  Brain injury.  Brain tumor.  Dementia.  Brain diseases, such as Alzheimer's,  Parkinson's, or Huntington's disease.  Neurological diseases, such as epilepsy.  Genetic disorders.  Metabolic disorders.  Infections that affect the brain.  Certain prescription drugs.  Stroke. SYMPTOMS   Unable to think or speak clearly or respond appropriately.  Disorganized thinking (thoughts jump from one thought to another).  Severe inappropriate behavior.  Delusions may include:  A strong belief that is odd, unrealistic, or false.  Feeling extremely fearful or suspicious (paranoid).  Believing you are someone else, have high importance, or have an altered identity.  Hallucinations. DIAGNOSIS   Mental health evaluation.  Physical exam.  Blood tests.  Computerized magnetic scan (MRI) or other brain scans. TREATMENT  Your caregiver will recommend a course of treatment that depends on the cause of the psychosis. Treatment may include:  Monitoring and supportive care in the hospital.  Taking medicines (antipsychotic medicine) to reduce symptoms and balance chemicals in the brain.  Taking medicines to manage underlying mental health conditions.  Therapy and other supportive programs outside of the hospital.  Treating an underlying medical condition. If the cause of the psychosis can be treated or corrected, the outlook is good. Without treatment, psychotic episodes can cause danger to yourself or others. Treatment may be short-term or lifelong. HOME CARE INSTRUCTIONS   Take all medicines as directed. This is important.  Use a pillbox or write down your medicine schedule to make sure you are taking them.  Check with your caregiver before using over-the-counter medicines, herbs, or supplements.  Seek individual and family support through therapy and mental health education (psychoeducation) programs. These will help you manage symptoms and side effects of medicines, learn life skills, and maintain a healthy  routine.  Maintain a healthy  lifestyle.  Exercise regularly.  Avoid alcohol and drugs.  Learn ways to reduce stress and cope with stress, such as yoga and meditation.  Talk about your feelings with family members or caregivers.  Make time for yourself to do things you enjoy.  Know the early warning signs of psychosis. Your caregiver will recommend steps to take when you notice symptoms such as:  Feeling anxious or preoccupied.  Having racing thoughts.  Changes in your interest in life and relationships.  Follow up with your caregivers for continued outpatient treatment as directed. SEEK MEDICAL CARE IF:   Medicines do not seem to be helping.  You hear voices telling you to do things.  You see, smell, or feel things that are not there.  You feel hopeless and overwhelmed.  You feel extremely fearful and suspicious that something will harm you.  You feel like you cannot leave your house.  You have trouble taking care of yourself.  You experience side effects of medicines, such as changes in sleep patterns, dizziness, weight gain, restlessness, movement changes, muscle spasms, or tremors. SEEK IMMEDIATE MEDICAL CARE IF:  Severe psychotic symptoms present a safety issue (such as an urge to hurt yourself or others). MAKE SURE YOU:   Understand these instructions.  Will watch your condition.  Will get help right away if you are not doing well or get worse. FOR MORE INFORMATION  National Institute of Mental Health: http://www.maynard.net/www.nimh.nih.gov Document Released: 10/18/2009 Document Revised: 07/23/2011 Document Reviewed: 10/18/2009 [facility] Patient Information 2015 [place], [place]. This information is not intended to replace advice given to you by your health care provider. Make sure you discuss any questions you have with your health care provider.

## 2014-06-22 NOTE — ED Notes (Signed)
[facility].  Pt has been exhibiting manic and psychotic behaviors.  [facility]  Denies SI/HI.

## 2014-06-22 NOTE — ED Provider Notes (Signed)
CSN: [id number]     Arrival date & time 06/22/14  1443 History   First MD Initiated Contact with Patient 06/22/14 1452     Chief Complaint  Patient presents with  . Medical Clearance     (Consider location/radiation/quality/duration/timing/severity/associated sxs/prior Treatment) HPI Comments: 29 year old female with a past medical history of mental retardation and schizoaffective schizophrenia [facility] in [facility] ED [facility]. Patient has been exhibiting manic and psychotic behaviors. No suicidal or homicidal ideations. Level V caveat secondary to psychiatric disorder.  The history is provided by the police.    Past Medical History  Diagnosis Date  . Schizo-affective schizophrenia   . Retardation    History reviewed. No pertinent past surgical history. History reviewed. No pertinent family history. History  Substance Use Topics  . Smoking status: Current Some Day Smoker    Types: Cigarettes  . Smokeless tobacco: Never Used  . Alcohol Use: No   OB History    No data available     Review of Systems  Unable to perform ROS: Psychiatric disorder      Allergies  Review of patient's allergies indicates no known allergies.  Home Medications   Prior to Admission medications   Medication Sig Start Date End Date Taking? Authorizing Provider  benztropine (COGENTIN) 1 MG tablet Take 1 mg by mouth 2 (two) times daily.   Yes Historical Provider, MD  Cholecalciferol (VITAMIN D3) 2000 UNITS TABS Take 2,000 Units by mouth daily.   Yes Historical Provider, MD  clonazePAM (KLONOPIN) 1 MG tablet Take 1 mg by mouth 2 (two) times daily.   Yes Historical Provider, MD  ferrous sulfate 325 (65 FE) MG tablet Take 325 mg by mouth daily.   Yes Historical Provider, MD  Paliperidone Palmitate 156 MG/ML SUSP Inject 1 mL into the muscle once.   Yes Historical Provider, MD  potassium chloride (K-DUR) 10 MEQ tablet Take 10  mEq by mouth daily.   Yes Historical Provider, MD  trazodone (DESYREL) 300 MG tablet Take 300 mg by mouth at bedtime.   Yes Historical Provider, MD  valproate (DEPAKENE) 250 MG/5ML syrup Take 500-1,000 mg by mouth 2 (two) times daily. 500 mg in the morning and 1000 mg at bedtime.   Yes Historical Provider, MD   BP 131/78 mmHg  Pulse 102  Temp(Src) 97.8 F (36.6 C) (Oral)  Resp 16  SpO2 98%  LMP  (LMP Unknown) Physical Exam  Constitutional: She is oriented to person, place, and time. She appears well-developed and well-nourished. No distress.  HENT:  Head: Normocephalic and atraumatic.  Mouth/Throat: Oropharynx is clear and moist.  Eyes: Conjunctivae and EOM are normal.  Neck: Normal range of motion. Neck supple.  Cardiovascular: Normal rate, regular rhythm and normal heart sounds.   Pulmonary/Chest: Effort normal and breath sounds normal. No respiratory distress.  Musculoskeletal: Normal range of motion. She exhibits no edema.  Neurological: She is alert and oriented to person, place, and time. No sensory deficit.  Skin: Skin is warm and dry.  Psychiatric: She is actively hallucinating (visual).  Nursing note and vitals reviewed.   ED Course  Procedures (including critical care time) Labs Review Labs Reviewed  ACETAMINOPHEN LEVEL - Abnormal; Notable for the following:    Acetaminophen (Tylenol), Serum <10.0 (*)    All other components within normal limits  CBC - Abnormal; Notable for the following:    RBC 3.34 (*)    Hemoglobin 10.0 (*)  HCT 33.5 (*)    MCV 100.3 (*)    MCHC 29.9 (*)    All other components within normal limits  COMPREHENSIVE METABOLIC PANEL - Abnormal; Notable for the following:    Creatinine, Ser 0.49 (*)    All other components within normal limits  URINE RAPID DRUG SCREEN (HOSP PERFORMED) - Abnormal; Notable for the following:    Benzodiazepines POSITIVE (*)    All other components within normal limits  VALPROIC ACID LEVEL - Abnormal; Notable for  the following:    Valproic Acid Lvl 39.7 (*)    All other components within normal limits  ETHANOL  SALICYLATE LEVEL  TSH    Imaging Review No results found.   EKG Interpretation   Date/Time:  Tuesday June 22 2014 15:09:57 EST Ventricular Rate:  90 PR Interval:    QRS Duration: 94 QT Interval:  350 QTC Calculation: 428 R Axis:   50 Text Interpretation:  Normal sinus rhythm Baseline wander in lead(s) V6  Otherwise normal ECG No old tracing to compare Confirmed by GOLDSTON  MD,  SCOTT ([id number]) on 06/22/2014 3:13:33 PM      MDM   Final diagnoses:  Medical clearance for psychiatric admission  Hallucination, visual   NAD. VSS. No tachycardia on my exam. Labs at baseline other than valproate level 39.7.  oral depakene given. Medically cleared for transfer back to [facility] for psych admission.  Kathrynn Speed, PA-C 06/22/14 1905  Enid Skeens, MD 06/23/14 [phone]

## 2015-05-22 ENCOUNTER — Emergency Department (HOSPITAL_COMMUNITY): Payer: Medicaid Other

## 2015-05-22 ENCOUNTER — Encounter (HOSPITAL_COMMUNITY): Payer: Self-pay | Admitting: Emergency Medicine

## 2015-05-22 ENCOUNTER — Emergency Department (HOSPITAL_COMMUNITY)
Admission: EM | Admit: 2015-05-22 | Discharge: 2015-05-22 | Disposition: A | Discharge status: Home / Self Care | Payer: Medicaid Other | Attending: Emergency Medicine | Admitting: Emergency Medicine

## 2015-05-22 DIAGNOSIS — F17210 Nicotine dependence, cigarettes, uncomplicated: Secondary | ICD-10-CM | POA: Insufficient documentation

## 2015-05-22 DIAGNOSIS — W009XXA Unspecified fall due to ice and snow, initial encounter: Secondary | ICD-10-CM

## 2015-05-22 DIAGNOSIS — Y9389 Activity, other specified: Secondary | ICD-10-CM | POA: Insufficient documentation

## 2015-05-22 DIAGNOSIS — F259 Schizoaffective disorder, unspecified: Secondary | ICD-10-CM | POA: Diagnosis not present

## 2015-05-22 DIAGNOSIS — W000XXA Fall on same level due to ice and snow, initial encounter: Secondary | ICD-10-CM | POA: Diagnosis not present

## 2015-05-22 DIAGNOSIS — Y998 Other external cause status: Secondary | ICD-10-CM | POA: Insufficient documentation

## 2015-05-22 DIAGNOSIS — Y9289 Other specified places as the place of occurrence of the external cause: Secondary | ICD-10-CM | POA: Diagnosis not present

## 2015-05-22 DIAGNOSIS — Z79899 Other long term (current) drug therapy: Secondary | ICD-10-CM | POA: Insufficient documentation

## 2015-05-22 DIAGNOSIS — M25562 Pain in left knee: Secondary | ICD-10-CM

## 2015-05-22 DIAGNOSIS — S8992XA Unspecified injury of left lower leg, initial encounter: Secondary | ICD-10-CM | POA: Insufficient documentation

## 2015-05-22 LAB — CBG MONITORING, ED: Glucose-Capillary: 82 mg/dL (ref 65–99)

## 2015-05-22 MED ORDER — IBUPROFEN 800 MG PO TABS
800.0000 mg | ORAL_TABLET | Freq: Once | ORAL | Status: AC
  Administered 2015-05-22: 800 mg via ORAL
  Filled 2015-05-22: qty 1

## 2015-05-22 NOTE — Discharge Instructions (Signed)
1. Medications: alternate naprosyn and tylenol for pain control, usual home medications °2. Treatment: rest, ice, elevate and use brace, drink plenty of fluids, gentle stretching °3. Follow Up: Please followup with orthopedics as directed or your PCP in 1 week if no improvement for discussion of your diagnoses and further evaluation after today's visit; if you do not have a primary care doctor use the resource guide provided to find one; Please return to the ER for worsening symptoms or other concerns ° ° ° °Cryotherapy °Cryotherapy means treatment with cold. Ice or gel packs can be used to reduce both pain and swelling. Ice is the most helpful within the first 24 to 48 hours after an injury or flare-up from overusing a muscle or joint. Sprains, strains, spasms, burning pain, shooting pain, and aches can all be eased with ice. Ice can also be used when recovering from surgery. Ice is effective, has very few side effects, and is safe for most people to use. °PRECAUTIONS  °Ice is not a safe treatment option for people with: °· Raynaud phenomenon. This is a condition affecting small blood vessels in the extremities. Exposure to cold may cause your problems to return. °· Cold hypersensitivity. There are many forms of cold hypersensitivity, including: °¨ Cold urticaria. Red, itchy hives appear on the skin when the tissues begin to warm after being iced. °¨ Cold erythema. This is a red, itchy rash caused by exposure to cold. °¨ Cold hemoglobinuria. Red blood cells break down when the tissues begin to warm after being iced. The hemoglobin that carry oxygen are passed into the urine because they cannot combine with blood proteins fast enough. °· Numbness or altered sensitivity in the area being iced. °If you have any of the following conditions, do not use ice until you have discussed cryotherapy with your caregiver: °· Heart conditions, such as arrhythmia, angina, or chronic heart disease. °· High blood  pressure. °· Healing wounds or open skin in the area being iced. °· Current infections. °· Rheumatoid arthritis. °· Poor circulation. °· Diabetes. °Ice slows the blood flow in the region it is applied. This is beneficial when trying to stop inflamed tissues from spreading irritating chemicals to surrounding tissues. However, if you expose your skin to cold temperatures for too long or without the proper protection, you can damage your skin or nerves. Watch for signs of skin damage due to cold. °HOME CARE INSTRUCTIONS °Follow these tips to use ice and cold packs safely. °· Place a dry or damp towel between the ice and skin. A damp towel will cool the skin more quickly, so you may need to shorten the time that the ice is used. °· For a more rapid response, add gentle compression to the ice. °· Ice for no more than 10 to 20 minutes at a time. The bonier the area you are icing, the less time it will take to get the benefits of ice. °· Check your skin after 5 minutes to make sure there are no signs of a poor response to cold or skin damage. °· Rest 20 minutes or more between uses. °· Once your skin is numb, you can end your treatment. You can test numbness by very lightly touching your skin. The touch should be so light that you do not see the skin dimple from the pressure of your fingertip. When using ice, most people will feel these normal sensations in this order: cold, burning, aching, and numbness. °· Do not use ice on someone who   cannot communicate their responses to pain, such as small children or people with dementia. °HOW TO MAKE AN ICE PACK °Ice packs are the most common way to use ice therapy. Other methods include ice massage, ice baths, and cryosprays. Muscle creams that cause a cold, tingly feeling do not offer the same benefits that ice offers and should not be used as a substitute unless recommended by your caregiver. °To make an ice pack, do one of the following: °· Place crushed ice or a bag of frozen  vegetables in a sealable plastic bag. Squeeze out the excess air. Place this bag inside another plastic bag. Slide the bag into a pillowcase or place a damp towel between your skin and the bag. °· Mix 3 parts water with 1 part rubbing alcohol. Freeze the mixture in a sealable plastic bag. When you remove the mixture from the freezer, it will be slushy. Squeeze out the excess air. Place this bag inside another plastic bag. Slide the bag into a pillowcase or place a damp towel between your skin and the bag. °SEEK MEDICAL CARE IF: °· You develop white spots on your skin. This may give the skin a blotchy (mottled) appearance. °· Your skin turns blue or pale. °· Your skin becomes waxy or hard. °· Your swelling gets worse. °MAKE SURE YOU:  °· Understand these instructions. °· Will watch your condition. °· Will get help right away if you are not doing well or get worse. °  °This information is not intended to replace advice given to you by your health care provider. Make sure you discuss any questions you have with your health care provider. °  °Document Released: 12/25/2010 Document Revised: 05/21/2014 Document Reviewed: 12/25/2010 °Elsevier Interactive Patient Education ©2016 Elsevier Inc. ° °

## 2015-05-22 NOTE — ED Notes (Signed)
Patient from a group home with MR, fell today on the ice hurting her left knee.  Patient does not know what group home she is from.  [facility] to [facility]  Patient reports she walked away from group home for some unknown reason.  History of diabetes.

## 2015-05-22 NOTE — ED Notes (Signed)
Patient resides at [facility] group home at [address] [place].  Patient to be discharged to group home with Quincy Simmondseana Simms.  [phone].

## 2015-05-22 NOTE — ED Provider Notes (Signed)
CSN: [id number]     Arrival date & time 05/22/15  1118 History   First MD Initiated Contact with Patient 05/22/15 1130     Chief Complaint  Patient presents with  . Fall     (Consider location/radiation/quality/duration/timing/severity/associated sxs/prior Treatment) The history is provided by the patient, medical records and the EMS personnel. No language interpreter was used.     Brittany Velez is a 30 y.o. female  with a hx of developmental delay, schizoaffective schizophrenia presents to the Emergency Department complaining of acute onset left knee pain after falling on the ice today. Patient reports she walked away from her group home but cannot give a reason.  Unknown what time she walked away. She reports she has not eaten today. She arrives via EMS after being [facility] the [facility].  Patient has been ambulatory for EMS without difficulty. Walking and palpation makes the pain worse. No treatments prior to arrival and nothing makes it better.  She continues to report that she is hungry.  EMR shows medications for seizures.  EMS reports potential history of diabetes.  Past Medical History  Diagnosis Date  . Schizo-affective schizophrenia (HCC)   . Retardation    History reviewed. No pertinent past surgical history. No family history on file. Social History  Substance Use Topics  . Smoking status: Current Some Day Smoker    Types: Cigarettes  . Smokeless tobacco: Never Used  . Alcohol Use: No   OB History    No data available     Review of Systems  Constitutional: Negative for fever and chills.  Gastrointestinal: Negative for nausea and vomiting.  Musculoskeletal: Positive for joint swelling and arthralgias ( left knee). Negative for back pain, neck pain and neck stiffness.  Skin: Negative for wound.  Neurological: Negative for numbness.  Hematological: Does not bruise/bleed easily.  Psychiatric/Behavioral: The patient is not nervous/anxious.   All other systems reviewed  and are negative.     Allergies  Review of patient's allergies indicates no known allergies.  Home Medications   Prior to Admission medications   Medication Sig Start Date End Date Taking? Authorizing Provider  benztropine (COGENTIN) 1 MG tablet Take 1 mg by mouth 2 (two) times daily.    Historical Provider, MD  Cholecalciferol (VITAMIN D3) 2000 UNITS TABS Take 2,000 Units by mouth daily.    Historical Provider, MD  clonazePAM (KLONOPIN) 1 MG tablet Take 1 mg by mouth 2 (two) times daily.    Historical Provider, MD  ferrous sulfate 325 (65 FE) MG tablet Take 325 mg by mouth daily.    Historical Provider, MD  Paliperidone Palmitate 156 MG/ML SUSP Inject 1 mL into the muscle once.    Historical Provider, MD  potassium chloride (K-DUR) 10 MEQ tablet Take 10 mEq by mouth daily.    Historical Provider, MD  trazodone (DESYREL) 300 MG tablet Take 300 mg by mouth at bedtime.    Historical Provider, MD  valproate (DEPAKENE) 250 MG/5ML syrup Take 500-1,000 mg by mouth 2 (two) times daily. 500 mg in the morning and 1000 mg at bedtime.    Historical Provider, MD   BP 134/93 mmHg  Pulse 103  Temp(Src) 98.1 F (36.7 C) (Oral)  Resp 20  SpO2 99%  LMP 03/22/2015 Physical Exam  Constitutional: She appears well-developed and well-nourished. No distress.  HENT:  Head: Normocephalic and atraumatic.  Eyes: Conjunctivae are normal.  Neck: Normal range of motion.  Cardiovascular: Normal rate, regular rhythm and intact distal pulses.  Capillary refill < 3 sec  Pulmonary/Chest: Effort normal and breath sounds normal.  Musculoskeletal: She exhibits tenderness. She exhibits no edema.  ROM: Range of motion of the left knee Mild swelling of the left knee Mild tenderness to palpation along the medial joint line. Patient ambulatory with steady gait  Neurological: She is alert. Coordination normal.  Sensation sensation intact to the left lower extremity Strength 5/5 in the right lower extremity, 5/5 in  the left hip and ankle, 4/5 at the left knee due to pain  Skin: Skin is warm and dry. She is not diaphoretic. No erythema.  No tenting of the skin  Psychiatric: She has a normal mood and affect.  Nursing note and vitals reviewed.   ED Course  Procedures (including critical care time) Labs Review Labs Reviewed  CBG MONITORING, ED    Imaging Review Dg Knee Complete 4 Views Left  05/22/2015  CLINICAL DATA:  Generalize knee pain after fall EXAM: LEFT KNEE - COMPLETE 4+ VIEW COMPARISON:  None FINDINGS: Moderate tricompartment osteoarthritis is noted. No joint effusion. There is no fracture or subluxation identified. IMPRESSION: 1. No acute findings. 2. Moderate tricompartment osteoarthritis. Electronically Signed   By: Signa Kell M.D.   On: 05/22/2015 12:11   I have personally reviewed and evaluated these images and lab results as part of my medical decision-making.   MDM   Final diagnoses:  Left knee pain  Fall from slipping on ice, initial encounter   Brittany Velez presents with left knee pain after fall.     11:55 AM Presented to contact patient's emergency contact but the phone appears to be disconnected.  12:15PM CBG within normal limits, x-ray of knee with moderate tricompartment osteoarthritis but no acute findings. Knee sleeve placed. Multiple phone numbers tried but we are still unable to contact caregiver ordered and by group home.  1:12 PM Social work consult and attempt to find patient's home or emergency contact.  2:32 PM Pt's caregiver, Quincy Simmonds from the group home at beside now.  Pt to be d/c into her care.  Pt remains ambulatory without difficulty in the ED.      BP 134/93 mmHg  Pulse 103  Temp(Src) 98.1 F (36.7 C) (Oral)  Resp 20  SpO2 99%  LMP 03/22/2015   Dierdre Forth, PA-C 05/22/15 1433  3:19 PM At time for discharge (approx 2:30) patient noted to be tachycardic. She denies shortness of breath or chest pain. Patient watched for  approximately one hour with persistence of tachycardia.  RRR without murmurs, good capillary refill, extremities are warm. She appears somewhat anxious and has been pacing around the room incessantly. She is without hypoxia or hypotension. She remains afebrile. Caregiver does not know if patient has previous episode of tachycardia. She is ambulatory, alert and oriented. At this time I believe she is safe for discharge home. Discussed with patient and caregiver reasons to return emergently to the emergency department including shortness of breath, chest pain, syncope or other concerns.  Dahlia Client Anaja Monts, PA-C 05/22/15 1521  Rolland Porter, MD 05/26/15 [phone]

## 2015-06-27 ENCOUNTER — Emergency Department (HOSPITAL_COMMUNITY)
Admission: EM | Admit: 2015-06-27 | Discharge: 2015-06-27 | Disposition: A | Discharge status: Home / Self Care | Payer: Medicaid Other | Attending: Emergency Medicine | Admitting: Emergency Medicine

## 2015-06-27 ENCOUNTER — Encounter (HOSPITAL_COMMUNITY): Payer: Self-pay | Admitting: *Deleted

## 2015-06-27 DIAGNOSIS — F79 Unspecified intellectual disabilities: Secondary | ICD-10-CM | POA: Diagnosis not present

## 2015-06-27 DIAGNOSIS — Z79899 Other long term (current) drug therapy: Secondary | ICD-10-CM | POA: Insufficient documentation

## 2015-06-27 DIAGNOSIS — R45851 Suicidal ideations: Secondary | ICD-10-CM

## 2015-06-27 DIAGNOSIS — F17210 Nicotine dependence, cigarettes, uncomplicated: Secondary | ICD-10-CM | POA: Insufficient documentation

## 2015-06-27 DIAGNOSIS — F259 Schizoaffective disorder, unspecified: Secondary | ICD-10-CM | POA: Diagnosis not present

## 2015-06-27 LAB — COMPREHENSIVE METABOLIC PANEL
ALT: 13 U/L — ABNORMAL LOW (ref 14–54)
AST: 21 U/L (ref 15–41)
Albumin: 4.1 g/dL (ref 3.5–5.0)
Alkaline Phosphatase: 67 U/L (ref 38–126)
Anion gap: 6 (ref 5–15)
BUN: 15 mg/dL (ref 6–20)
CO2: 28 mmol/L (ref 22–32)
CREATININE: 0.62 mg/dL (ref 0.44–1.00)
Calcium: 9.4 mg/dL (ref 8.9–10.3)
Chloride: 106 mmol/L (ref 101–111)
GFR calc Af Amer: >60 mL/min (ref >60)
GFR calc non Af Amer: >60 mL/min (ref >60)
Glucose, Bld: 114 mg/dL — ABNORMAL HIGH (ref 65–99)
POTASSIUM: 3.7 mmol/L (ref 3.5–5.1)
Sodium: 140 mmol/L (ref 135–145)
Total Bilirubin: 0.4 mg/dL (ref 0.3–1.2)
Total Protein: 7.9 g/dL (ref 6.5–8.1)

## 2015-06-27 LAB — CBC
HCT: 36.4 % (ref 36.0–46.0)
Hemoglobin: 11.8 g/dL — ABNORMAL LOW (ref 12.0–15.0)
MCH: 30.2 pg (ref 26.0–34.0)
MCHC: 32.4 g/dL (ref 30.0–36.0)
MCV: 93.1 fL (ref 78.0–100.0)
PLATELETS: 203 10*3/uL (ref 150–400)
RBC: 3.91 MIL/uL (ref 3.87–5.11)
RDW: 13.8 % (ref 11.5–15.5)
WBC: 6 10*3/uL (ref 4.0–10.5)

## 2015-06-27 LAB — SALICYLATE LEVEL: Salicylate Lvl: <4 mg/dL (ref 2.8–30.0)

## 2015-06-27 LAB — ACETAMINOPHEN LEVEL: Acetaminophen (Tylenol), Serum: <10 ug/mL — ABNORMAL LOW (ref 10–30)

## 2015-06-27 LAB — ETHANOL: Alcohol, Ethyl (B): <5 mg/dL (ref <5)

## 2015-06-27 MED ORDER — LORAZEPAM 1 MG PO TABS: 1.0000 mg | ORAL_TABLET | Freq: Three times a day (TID) | ORAL | Status: DC | PRN

## 2015-06-27 MED ORDER — ZOLPIDEM TARTRATE 5 MG PO TABS: 5.0000 mg | ORAL_TABLET | Freq: Every evening | ORAL | Status: DC | PRN

## 2015-06-27 MED ORDER — METFORMIN HCL ER 750 MG PO TB24
750.0000 mg | ORAL_TABLET | Freq: Every day | ORAL | Status: DC
  Filled 2015-06-27: qty 1

## 2015-06-27 MED ORDER — POTASSIUM CHLORIDE ER 10 MEQ PO TBCR: 10.0000 meq | EXTENDED_RELEASE_TABLET | Freq: Every day | ORAL | Status: DC

## 2015-06-27 MED ORDER — ONDANSETRON HCL 4 MG PO TABS: 4.0000 mg | ORAL_TABLET | Freq: Three times a day (TID) | ORAL | Status: DC | PRN

## 2015-06-27 MED ORDER — CLOZAPINE 100 MG PO TABS: 200.0000 mg | ORAL_TABLET | Freq: Every day | ORAL | Status: DC

## 2015-06-27 MED ORDER — DOCUSATE SODIUM 100 MG PO CAPS: 200.0000 mg | ORAL_CAPSULE | Freq: Every day | ORAL | Status: DC

## 2015-06-27 MED ORDER — FOLIC ACID 1 MG PO TABS: 1.0000 mg | ORAL_TABLET | Freq: Every day | ORAL | Status: DC

## 2015-06-27 MED ORDER — TRAZODONE HCL 100 MG PO TABS: 300.0000 mg | ORAL_TABLET | Freq: Every day | ORAL | Status: DC

## 2015-06-27 MED ORDER — IBUPROFEN 200 MG PO TABS: 600.0000 mg | ORAL_TABLET | Freq: Three times a day (TID) | ORAL | Status: DC | PRN

## 2015-06-27 MED ORDER — CLONAZEPAM 0.5 MG PO TABS: 1.0000 mg | ORAL_TABLET | Freq: Two times a day (BID) | ORAL | Status: DC

## 2015-06-27 MED ORDER — PALIPERIDONE PALMITATE 156 MG/ML IM SUSP: 156.0000 mg | Freq: Once | INTRAMUSCULAR | Status: DC

## 2015-06-27 MED ORDER — SENNA 8.6 MG PO TABS: 1.0000 | ORAL_TABLET | Freq: Two times a day (BID) | ORAL | Status: DC

## 2015-06-27 MED ORDER — FERROUS SULFATE 325 (65 FE) MG PO TABS
325.0000 mg | ORAL_TABLET | Freq: Every day | ORAL | Status: DC
  Filled 2015-06-27: qty 1

## 2015-06-27 MED ORDER — ADULT MULTIVITAMIN W/MINERALS CH: 1.0000 | ORAL_TABLET | Freq: Every day | ORAL | Status: DC

## 2015-06-27 MED ORDER — HALOPERIDOL 5 MG PO TABS: 5.0000 mg | ORAL_TABLET | Freq: Two times a day (BID) | ORAL | Status: DC

## 2015-06-27 MED ORDER — BENZTROPINE MESYLATE 1 MG PO TABS: 1.0000 mg | ORAL_TABLET | Freq: Two times a day (BID) | ORAL | Status: DC

## 2015-06-27 MED ORDER — LORAZEPAM 1 MG PO TABS: 2.0000 mg | ORAL_TABLET | Freq: Four times a day (QID) | ORAL | Status: DC | PRN

## 2015-06-27 MED ORDER — CLOZAPINE 25 MG PO TABS
50.0000 mg | ORAL_TABLET | Freq: Every day | ORAL | Status: DC
Start: 2015-06-27 — End: 2015-06-28

## 2015-06-27 MED ORDER — ALUM & MAG HYDROXIDE-SIMETH 200-200-20 MG/5ML PO SUSP: 30.0000 mL | ORAL | Status: DC | PRN

## 2015-06-27 MED ORDER — ACETAMINOPHEN 325 MG PO TABS: 650.0000 mg | ORAL_TABLET | ORAL | Status: DC | PRN

## 2015-06-27 MED ORDER — VALPROIC ACID 250 MG PO CAPS
1250.0000 mg | ORAL_CAPSULE | Freq: Two times a day (BID) | ORAL | Status: DC
Start: 2015-06-27 — End: 2015-06-28
  Filled 2015-06-27: qty 5

## 2015-06-27 MED ORDER — VALPROATE SODIUM 250 MG/5ML PO SYRP: 500.0000 mg | ORAL_SOLUTION | Freq: Two times a day (BID) | ORAL | Status: DC

## 2015-06-27 MED ORDER — VITAMIN D3 25 MCG (1000 UNIT) PO TABS
2000.0000 [IU] | ORAL_TABLET | Freq: Every day | ORAL | Status: DC
  Filled 2015-06-27: qty 2

## 2015-06-27 NOTE — ED Notes (Signed)
Pt transported to the ED by [employer].  Pt reports SI.  Lives at a group home and has been there for 3-4 mos.  She states she does not like it there, reports she had 2 pieces of chocolate and small cinnamon rolls this am.

## 2015-06-27 NOTE — ED Notes (Signed)
Patient was alert, oriented and stable upon discharge. RN went over AVS and patient had no further questions.  
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
## 2015-06-27 NOTE — ED Provider Notes (Signed)
Medical screening examination/treatment/procedure(s) were conducted as a shared visit with non-physician practitioner(s) and myself.  I personally evaluated the patient during the encounter.  Patient here after becoming upset at the group home and expressing suicidal ideations Has been seen by behavioral health and does not meet criteria for admission at this time. I have seen the patient as well as she denies being suicidal. I spoke to the group home staff member who will start taking the patient home. Patient stable for discharge  Lorre Nick, MD 06/27/15 2121

## 2015-06-27 NOTE — Discharge Instructions (Signed)
Return here for any new or worsening symptoms, specifically if you become more suicidal with plan, began to have homicidal ideation, or any worsening hallucinations.  Suicidal Feelings: How to Help Yourself Suicide is the taking of one's own life. If you feel as though life is getting too tough to handle and are thinking about suicide, get help right away. To get help:  Call your local emergency services (911 in the [place]  Call a suicide hotline to speak with a trained counselor who understands how you are feeling. The following is a list of suicide hotlines in the [place]. For a list of hotlines in [place], visit InkDistributor.it.  1-800-273-TALK 862-633-9466).  1-800-SUICIDE 680-865-5180).  563 795 3013. This is a hotline for Spanish speakers.  2-536-644-0HKV (956)729-1377). This is a hotline for TTY users.  1-866-4-U-TREVOR 385-309-6786). This is a hotline for lesbian, gay, bisexual, transgender, or questioning youth.  Contact a crisis center or a local suicide prevention center. To find a crisis center or suicide prevention center:  Call your local hospital, clinic, community service organization, mental health center, social service provider, or health department. Ask for assistance in connecting to a crisis center.  Visit https://www.patel-king.com/ for a list of crisis centers in the [place], or visit www.suicideprevention.ca/thinking-about-suicide/find-a-crisis-centre for a list of centers in [place].  Visit the following websites:  National Suicide Prevention Lifeline: www.suicidepreventionlifeline.org  Hopeline: www.hopeline.com  [employer] for Suicide Prevention: https://www.ayers.com/  [employer] (for lesbian, gay, bisexual, transgender, or questioning youth): www.thetrevorproject.org HOW CAN I HELP MYSELF FEEL BETTER?  Promise yourself that you will not  do anything drastic when you have suicidal feelings. Remember, there is hope. Many people have gotten through suicidal thoughts and feelings, and you will, too. You may have gotten through them before, and this proves that you can get through them again.  Let family, friends, teachers, or counselors know how you are feeling. Try not to isolate yourself from those who care about you. Remember, they will want to help you. Talk with someone every day, even if you do not feel sociable. Face-to-face conversation is best.  Call a mental health professional and see one regularly.  Visit your primary health care provider every year.  Eat a well-balanced diet, and space your meals so you eat regularly.  Get plenty of rest.  Avoid alcohol and drugs, and remove them from your home. They will only make you feel worse.  If you are thinking of taking a lot of medicine, give your medicine to someone who can give it to you one day at a time. If you are on antidepressants and are concerned you will overdose, let your health care provider know so he or she can give you safer medicines. Ask your mental health professional about the possible side effects of any medicines you are taking.  Remove weapons, poisons, knives, and anything else that could harm you from your home.  Try to stick to routines. Follow a schedule every day. Put self-care on your schedule.  Make a list of realistic goals, and cross them off when you achieve them. Accomplishments give a sense of worth.  Wait until you are feeling better before doing the things you find difficult or unpleasant.  Exercise if you are able. You will feel better if you exercise for even a half hour each day.  Go out in the sun or into nature. This will help you recover from depression faster. If you have a favorite place to walk, go there.  Do  the things that have always given you pleasure. Play your favorite music, read a good book, paint a picture, play your  favorite instrument, or do anything else that takes your mind off your depression if it is safe to do.  Keep your living space well lit.  When you are feeling well, write yourself a letter about tips and support that you can read when you are not feeling well.  Remember that life's difficulties can be sorted out with help. Conditions can be treated. You can work on thoughts and strategies that serve you well.   This information is not intended to replace advice given to you by your health care provider. Make sure you discuss any questions you have with your health care provider.   Document Released: 11/04/2002 Document Revised: 05/21/2014 Document Reviewed: 08/25/2013 Elsevier Interactive Patient Education [employer]

## 2015-07-07 ENCOUNTER — Emergency Department (HOSPITAL_COMMUNITY)
Admission: EM | Admit: 2015-07-07 | Discharge: 2015-07-08 | Disposition: A | Discharge status: Home / Self Care | Payer: Medicaid Other | Attending: Emergency Medicine | Admitting: Emergency Medicine

## 2015-07-07 ENCOUNTER — Encounter (HOSPITAL_COMMUNITY): Payer: Self-pay | Admitting: Emergency Medicine

## 2015-07-07 ENCOUNTER — Emergency Department (HOSPITAL_COMMUNITY)
Admission: EM | Admit: 2015-07-07 | Discharge: 2015-07-07 | Discharge status: Against Medical Advice | Payer: Medicaid Other | Source: Home / Self Care | Attending: Emergency Medicine | Admitting: Emergency Medicine

## 2015-07-07 ENCOUNTER — Encounter (HOSPITAL_COMMUNITY): Payer: Self-pay | Admitting: *Deleted

## 2015-07-07 DIAGNOSIS — Z7984 Long term (current) use of oral hypoglycemic drugs: Secondary | ICD-10-CM | POA: Insufficient documentation

## 2015-07-07 DIAGNOSIS — Z79899 Other long term (current) drug therapy: Secondary | ICD-10-CM | POA: Insufficient documentation

## 2015-07-07 DIAGNOSIS — F209 Schizophrenia, unspecified: Secondary | ICD-10-CM | POA: Insufficient documentation

## 2015-07-07 DIAGNOSIS — F17210 Nicotine dependence, cigarettes, uncomplicated: Secondary | ICD-10-CM

## 2015-07-07 DIAGNOSIS — F419 Anxiety disorder, unspecified: Secondary | ICD-10-CM | POA: Insufficient documentation

## 2015-07-07 DIAGNOSIS — F4325 Adjustment disorder with mixed disturbance of emotions and conduct: Secondary | ICD-10-CM | POA: Diagnosis not present

## 2015-07-07 DIAGNOSIS — R000 Tachycardia, unspecified: Secondary | ICD-10-CM | POA: Diagnosis not present

## 2015-07-07 DIAGNOSIS — Z792 Long term (current) use of antibiotics: Secondary | ICD-10-CM

## 2015-07-07 DIAGNOSIS — R45851 Suicidal ideations: Secondary | ICD-10-CM | POA: Diagnosis present

## 2015-07-07 LAB — COMPREHENSIVE METABOLIC PANEL
ALT: 13 U/L — ABNORMAL LOW (ref 14–54)
AST: 19 U/L (ref 15–41)
Albumin: 3.9 g/dL (ref 3.5–5.0)
Alkaline Phosphatase: 61 U/L (ref 38–126)
Anion gap: 7 (ref 5–15)
BILIRUBIN TOTAL: 0.3 mg/dL (ref 0.3–1.2)
BUN: 13 mg/dL (ref 6–20)
CHLORIDE: 108 mmol/L (ref 101–111)
CO2: 25 mmol/L (ref 22–32)
CREATININE: 0.58 mg/dL (ref 0.44–1.00)
Calcium: 9.2 mg/dL (ref 8.9–10.3)
Glucose, Bld: 109 mg/dL — ABNORMAL HIGH (ref 65–99)
POTASSIUM: 4 mmol/L (ref 3.5–5.1)
Sodium: 140 mmol/L (ref 135–145)
Total Protein: 7.7 g/dL (ref 6.5–8.1)

## 2015-07-07 LAB — ACETAMINOPHEN LEVEL: Acetaminophen (Tylenol), Serum: <10 ug/mL — ABNORMAL LOW (ref 10–30)

## 2015-07-07 LAB — CBC
HCT: 36.6 % (ref 36.0–46.0)
HEMOGLOBIN: 11.6 g/dL — AB (ref 12.0–15.0)
MCH: 30 pg (ref 26.0–34.0)
MCHC: 31.7 g/dL (ref 30.0–36.0)
MCV: 94.6 fL (ref 78.0–100.0)
PLATELETS: 201 10*3/uL (ref 150–400)
RBC: 3.87 MIL/uL (ref 3.87–5.11)
RDW: 14.1 % (ref 11.5–15.5)
WBC: 5.8 10*3/uL (ref 4.0–10.5)

## 2015-07-07 LAB — ETHANOL

## 2015-07-07 LAB — SALICYLATE LEVEL

## 2015-07-07 MED ORDER — DOCUSATE SODIUM 100 MG PO CAPS
200.0000 mg | ORAL_CAPSULE | Freq: Every day | ORAL | Status: DC
  Administered 2015-07-08: 200 mg via ORAL
  Filled 2015-07-07: qty 2

## 2015-07-07 MED ORDER — VALPROIC ACID 250 MG PO CAPS
1250.0000 mg | ORAL_CAPSULE | Freq: Two times a day (BID) | ORAL | Status: DC
  Administered 2015-07-08 (×2): 1250 mg via ORAL
  Filled 2015-07-07 (×3): qty 5

## 2015-07-07 MED ORDER — FOLIC ACID 1 MG PO TABS
1.0000 mg | ORAL_TABLET | Freq: Every day | ORAL | Status: DC
  Administered 2015-07-08: 1 mg via ORAL
  Filled 2015-07-07: qty 1

## 2015-07-07 MED ORDER — CLOZAPINE 200 MG PO TABS
600.0000 mg | ORAL_TABLET | Freq: Every day | ORAL | Status: DC
  Filled 2015-07-07 (×2): qty 3

## 2015-07-07 MED ORDER — ADULT MULTIVITAMIN W/MINERALS CH
1.0000 | ORAL_TABLET | Freq: Every day | ORAL | Status: DC
  Administered 2015-07-08: 1 via ORAL
  Filled 2015-07-07: qty 1

## 2015-07-07 MED ORDER — LORAZEPAM 1 MG PO TABS: 2.0000 mg | ORAL_TABLET | Freq: Four times a day (QID) | ORAL | Status: DC | PRN

## 2015-07-07 MED ORDER — BENZTROPINE MESYLATE 1 MG PO TABS
1.0000 mg | ORAL_TABLET | Freq: Two times a day (BID) | ORAL | Status: DC
  Administered 2015-07-08 (×2): 1 mg via ORAL
  Filled 2015-07-07 (×2): qty 1

## 2015-07-07 MED ORDER — METFORMIN HCL ER 750 MG PO TB24
750.0000 mg | ORAL_TABLET | Freq: Every day | ORAL | Status: DC
  Administered 2015-07-08: 750 mg via ORAL
  Filled 2015-07-07 (×2): qty 1

## 2015-07-07 MED ORDER — HALOPERIDOL 5 MG PO TABS
5.0000 mg | ORAL_TABLET | Freq: Two times a day (BID) | ORAL | Status: DC
  Administered 2015-07-08 (×2): 5 mg via ORAL
  Filled 2015-07-07 (×2): qty 1

## 2015-07-07 NOTE — ED Notes (Addendum)
Per [facility], patient states she is feeling suicidal-states she is from group home ([facility])-group home director states she does this to get attention-group home provider states she does this once a week-wanted [facility] to take her to get something to eat prior to coming to ED

## 2015-07-07 NOTE — ED Notes (Signed)
Ambulatory w/o difficulty from triage 

## 2015-07-07 NOTE — ED Notes (Signed)
TTS into see 

## 2015-07-07 NOTE — BH Assessment (Signed)
Assessment completed. Consulted Hulan Fess, NP who recommended that pt be evaluated by psychiatry in the morning. Informed Dr. Madilyn Hook of the recommendation.

## 2015-07-07 NOTE — ED Provider Notes (Signed)
CSN: [id number]     Arrival date & time 07/07/15  1757 History   First MD Initiated Contact with Patient 07/07/15 1821     Chief Complaint  Patient presents with  . Suicidal     The history is provided by the patient. No language interpreter was used.   Brittany Velez is a 30 y.o. female who presents to the Emergency Department complaining of SI. She returns to the ED after just eloping from the ED.  She left the department to get something to eat and then called the police because she still wanted to kill herself by wanting to cut her throat.  No additional complaints.  Sxs are severe and constant.  Past Medical History  Diagnosis Date  . Schizo-affective schizophrenia (HCC)   . Retardation    History reviewed. No pertinent past surgical history. No family history on file. Social History  Substance Use Topics  . Smoking status: Current Some Day Smoker    Types: Cigarettes  . Smokeless tobacco: Never Used  . Alcohol Use: No   OB History    No data available     Review of Systems  All other systems reviewed and are negative.     Allergies  Review of patient's allergies indicates no known allergies.  Home Medications   Prior to Admission medications   Medication Sig Start Date End Date Taking? Authorizing Provider  benztropine (COGENTIN) 1 MG tablet Take 1 mg by mouth 2 (two) times daily.    Historical Provider, MD  clozapine (CLOZARIL) 200 MG tablet Take 200 mg by mouth daily. Takes  tablet with two 25 mg tablets for complete daily dosage of .    Historical Provider, MD  cloZAPine (CLOZARIL) 25 MG tablet Take 50 mg by mouth daily. Takes  tablet with two 25 mg tablets for complete daily dosage of .    Historical Provider, MD  docusate sodium (COLACE) 100 MG capsule Take 200 mg by mouth at bedtime.    Historical Provider, MD  folic acid (FOLVITE) 1 MG tablet Take 1 mg by mouth daily.    Historical Provider, MD  haloperidol (HALDOL) 5 MG tablet Take 5  mg by mouth 2 (two) times daily.    Historical Provider, MD  LORazepam (ATIVAN) 2 MG tablet Take 2 mg by mouth every 6 (six) hours as needed for anxiety.    Historical Provider, MD  metFORMIN (GLUCOPHAGE-XR) 750 MG 24 hr tablet Take 750 mg by mouth daily with breakfast.    Historical Provider, MD  Multiple Vitamin (MULTIVITAMIN WITH MINERALS) TABS tablet Take 1 tablet by mouth daily.    Historical Provider, MD  senna (SENOKOT) 8.6 MG TABS tablet Take 1 tablet by mouth 2 (two) times daily.    Historical Provider, MD  valproic acid (DEPAKENE) 250 MG capsule Take 1,250 mg by mouth 2 (two) times daily.    Historical Provider, MD   BP 132/94 mmHg  Pulse 110  Temp(Src) 98.8 F (37.1 C) (Oral)  SpO2 97%  LMP 06/26/2015 Physical Exam  Constitutional: She is oriented to person, place, and time. She appears well-developed and well-nourished.  HENT:  Head: Normocephalic and atraumatic.  Cardiovascular: Regular rhythm.   tachycardic  Pulmonary/Chest: Effort normal. No respiratory distress.  Musculoskeletal: Normal range of motion.  Neurological: She is alert and oriented to person, place, and time.  Skin: Skin is warm.  Psychiatric:  Anxious appearing.  Ongoing SI  Nursing note and vitals reviewed.   ED Course  Procedures (including  critical care time) Labs Review Labs Reviewed - No data to display  Imaging Review No results found. I have personally reviewed and evaluated these images and lab results as part of my medical decision-making.   EKG Interpretation None      MDM   Final diagnoses:  None   Pt with hx/o schizoaffective disorder and MR here with SI.  She has been medically cleared for psychiatric evaluation .     Tilden Fossa, MD 07/07/15 [phone]

## 2015-07-07 NOTE — ED Notes (Signed)
Patient calm, cooperative. Reports that she wants surgeries to change who she is. States she wants to be happy with who she is when she wakes up.   Encouragement offered.

## 2015-07-07 NOTE — ED Notes (Signed)
Patient appears anxious, childlike. Denies SI, HI, AVH at present. Requesting multiple snacks. Denies feelings anxiety, sadness/depression to Clinical research associate.  Encouragement offered. Meal provided.   Q 15 safety checks in place.
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
## 2015-07-07 NOTE — ED Notes (Signed)
Bed: [facility] Expected date:  Expected time:  Means of arrival:  Comments: EMS 

## 2015-07-07 NOTE — ED Notes (Signed)
Pt left ED after requesting and eating a large amount of food.
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
## 2015-07-08 DIAGNOSIS — F4325 Adjustment disorder with mixed disturbance of emotions and conduct: Secondary | ICD-10-CM

## 2015-07-08 LAB — DIFFERENTIAL
BASOS PCT: 0 %
Basophils Absolute: 0 10*3/uL (ref 0.0–0.1)
EOS PCT: 1 %
Eosinophils Absolute: 0.1 10*3/uL (ref 0.0–0.7)
Lymphocytes Relative: 30 %
Lymphs Abs: 1.9 10*3/uL (ref 0.7–4.0)
MONO ABS: 0.4 10*3/uL (ref 0.1–1.0)
MONOS PCT: 6 %
NEUTROS ABS: 4 10*3/uL (ref 1.7–7.7)
Neutrophils Relative %: 63 %

## 2015-07-08 MED ORDER — CLOZAPINE 25 MG PO TABS
250.0000 mg | ORAL_TABLET | Freq: Every day | ORAL | Status: DC
  Administered 2015-07-08: 250 mg via ORAL
  Filled 2015-07-08: qty 2

## 2015-07-08 NOTE — [facility] Suicide Risk Assessment (Signed)
Suicide Risk Assessment  Discharge Assessment   [facility] Discharge Suicide Risk Assessment   Principal Problem: Adjustment disorder with mixed disturbance of emotions and conduct Discharge Diagnoses:  Patient Active Problem List   Diagnosis Date Noted  . Adjustment disorder with mixed disturbance of emotions and conduct [F43.25] 07/08/2015    Priority: High  . Schizophrenia, schizoaffective, chronic with acute exacerbation (HCC) [F25.8] 06/06/2014  . Auditory hallucination [R44.0]   . Visual hallucination [R44.1]     Total Time spent with patient: 45 minutes  Musculoskeletal: Strength & Muscle Tone: within normal limits Gait & Station: normal Patient leans: N/A  Psychiatric Specialty Exam: Review of Systems  Constitutional: Negative.   HENT: Negative.   Eyes: Negative.   Respiratory: Negative.   Cardiovascular: Negative.   Gastrointestinal: Negative.   Genitourinary: Negative.   Musculoskeletal: Negative.   Skin: Negative.   Neurological: Negative.   Endo/Heme/Allergies: Negative.   Psychiatric/Behavioral:       Negative    Blood pressure 125/67, pulse 103, temperature 98.7 F (37.1 C), temperature source Oral, resp. rate 18, last menstrual period 06/26/2015, SpO2 99 %.There is no height or weight on file to calculate BMI.  General Appearance: Disheveled  Eye Contact::  Good  Speech:  Normal Rate  Volume:  Normal  Mood:  Anxious, mild  Affect:  Congruent  Thought Process:  Coherent  Orientation:  Full (Time, Place, and Person)  Thought Content:  WDL  Suicidal Thoughts:  No  Homicidal Thoughts:  No  Memory:  Immediate;   Good Recent;   Good Remote;   Good  Judgement:  Fair  Insight:  Fair  Psychomotor Activity:  Normal  Concentration:  Good  Recall:  Good  Fund of Knowledge:Fair  Language: Good  Akathisia:  No  Handed:  Right  AIMS (if indicated):     Assets:  Housing Leisure Time Physical Health Resilience Social Support  ADL's:  Intact  Cognition:  Impaired,  Mild  Sleep:      Mental Status Per Nursing Assessment::   On Admission:   suicidal ideations   Demographic Factors:  NA  Loss Factors: NA  Historical Factors: Impulsivity  Risk Reduction Factors:   Living with another person, especially a relative, Positive social support and Positive therapeutic relationship  Continued Clinical Symptoms:  Anxiety, mild  Cognitive Features That Contribute To Risk:  None    Suicide Risk:  Minimal: No identifiable suicidal ideation.  Patients presenting with no risk factors but with morbid ruminations; may be classified as minimal risk based on the severity of the depressive symptoms    Plan Of Care/Follow-up recommendations:  Activity:  as tolerated Diet:  heart healthy diet  Draco Malczewski, NP 07/08/2015, 12:13 PM

## 2015-07-08 NOTE — ED Notes (Signed)
Pt discharged ambulatory with her Group home leader.  Pt was in no distress and was calm and cooperative.  Pt given snack for when she arrived home.  All belongings were returned to pt.
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
## 2015-07-13 ENCOUNTER — Emergency Department (HOSPITAL_COMMUNITY)
Admission: EM | Admit: 2015-07-13 | Discharge: 2015-07-14 | Disposition: A | Discharge status: Home / Self Care | Payer: Medicaid Other | Attending: Emergency Medicine | Admitting: Emergency Medicine

## 2015-07-13 ENCOUNTER — Encounter (HOSPITAL_COMMUNITY): Payer: Self-pay | Admitting: Emergency Medicine

## 2015-07-13 DIAGNOSIS — F1310 Sedative, hypnotic or anxiolytic abuse, uncomplicated: Secondary | ICD-10-CM | POA: Insufficient documentation

## 2015-07-13 DIAGNOSIS — Z7984 Long term (current) use of oral hypoglycemic drugs: Secondary | ICD-10-CM | POA: Insufficient documentation

## 2015-07-13 DIAGNOSIS — R45851 Suicidal ideations: Secondary | ICD-10-CM | POA: Diagnosis present

## 2015-07-13 DIAGNOSIS — F209 Schizophrenia, unspecified: Secondary | ICD-10-CM | POA: Insufficient documentation

## 2015-07-13 DIAGNOSIS — Z79899 Other long term (current) drug therapy: Secondary | ICD-10-CM | POA: Insufficient documentation

## 2015-07-13 DIAGNOSIS — F17210 Nicotine dependence, cigarettes, uncomplicated: Secondary | ICD-10-CM | POA: Diagnosis not present

## 2015-07-13 LAB — COMPREHENSIVE METABOLIC PANEL
ALBUMIN: 3.7 g/dL (ref 3.5–5.0)
ALT: 11 U/L — ABNORMAL LOW (ref 14–54)
ANION GAP: 7 (ref 5–15)
AST: 16 U/L (ref 15–41)
Alkaline Phosphatase: 67 U/L (ref 38–126)
BILIRUBIN TOTAL: 0.4 mg/dL (ref 0.3–1.2)
BUN: 16 mg/dL (ref 6–20)
CHLORIDE: 108 mmol/L (ref 101–111)
CO2: 26 mmol/L (ref 22–32)
Calcium: 9.3 mg/dL (ref 8.9–10.3)
Creatinine, Ser: 0.5 mg/dL (ref 0.44–1.00)
GFR calc Af Amer: >60 mL/min (ref >60)
GLUCOSE: 93 mg/dL (ref 65–99)
POTASSIUM: 4.3 mmol/L (ref 3.5–5.1)
Sodium: 141 mmol/L (ref 135–145)
TOTAL PROTEIN: 7.3 g/dL (ref 6.5–8.1)

## 2015-07-13 LAB — RAPID URINE DRUG SCREEN, HOSP PERFORMED
Amphetamines: NOT DETECTED
BENZODIAZEPINES: POSITIVE — AB
Barbiturates: NOT DETECTED
COCAINE: NOT DETECTED
OPIATES: NOT DETECTED
Tetrahydrocannabinol: NOT DETECTED

## 2015-07-13 LAB — CBC
HCT: 35.4 % — ABNORMAL LOW (ref 36.0–46.0)
Hemoglobin: 11.3 g/dL — ABNORMAL LOW (ref 12.0–15.0)
MCH: 29.7 pg (ref 26.0–34.0)
MCHC: 31.9 g/dL (ref 30.0–36.0)
MCV: 93.2 fL (ref 78.0–100.0)
PLATELETS: 211 10*3/uL (ref 150–400)
RBC: 3.8 MIL/uL — ABNORMAL LOW (ref 3.87–5.11)
RDW: 13.9 % (ref 11.5–15.5)
WBC: 6.5 10*3/uL (ref 4.0–10.5)

## 2015-07-13 LAB — ACETAMINOPHEN LEVEL

## 2015-07-13 LAB — SALICYLATE LEVEL: Salicylate Lvl: <4 mg/dL (ref 2.8–30.0)

## 2015-07-13 LAB — ETHANOL
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
## 2015-07-13 NOTE — ED Notes (Signed)
Pt reports suicidal ideation with plan of scratching her skin with pencil. denies homicidal ideation . Pt from group home escorted by [facility] and staff member from group home . Per staff pt been seen several times for SI and has been released back to group home, per staff pt was upset today  because could not have the food she wanted the ACT team to order. Alert and orienetd x 4. Pt states that she does not like the group home and don't want go back there anymore.

## 2015-07-13 NOTE — ED Notes (Signed)
Pt walked out to Nurses' station asking for food and stating that she has not eaten all day.  Pt's group home staff member reports "we are here, because of her obsession w/ food.  She will change her whole tune after she eats.  She has had breakfast and lunch."

## 2015-07-13 NOTE — ED Provider Notes (Signed)
CSN: [id number]     Arrival date & time 07/13/15  1658 History   First MD Initiated Contact with Patient 07/13/15 1802     Chief Complaint  Patient presents with  . Suicidal     (Consider location/radiation/quality/duration/timing/severity/associated sxs/prior Treatment) HPI   Patient is a 30 year old female with past medical history of mental retardation and schizophrenia who presents to the ED with suicidal ideation. Patient reports she has been having suicidal ideations for the past 2 days. She notes she has been scratching herself with pencils and staples with the intent of wanting to harm herself. She denies having any specific suicidal plan. Patient also endorses having homicidal ideations and reports that she "will throw anything she can find at any one that is standing in her way." Denies visual/auditory hallucinations. Patient reports that she is currently living at a group home and states that she has been unhappy there and wants to move. Patient denies having any pain or complaints at this time. Patient denies drinking alcohol or using any IV drugs/street drugs.  Patient group home staff member reports that the patient is only seen she is having suicidal ideations that she can be admitted to the hospital in order to receive food. She notes that the patient has been receiving food at her group home hemoglobin of the patient reports that she has not been given any food today.  Past Medical History  Diagnosis Date  . Schizo-affective schizophrenia (HCC)   . Retardation    History reviewed. No pertinent past surgical history. No family history on file. Social History  Substance Use Topics  . Smoking status: Current Some Day Smoker    Types: Cigarettes  . Smokeless tobacco: Never Used  . Alcohol Use: No   OB History    No data available     Review of Systems  Psychiatric/Behavioral:       Suicudal   All other systems reviewed and are negative.     Allergies  Review of  patient's allergies indicates no known allergies.  Home Medications   Prior to Admission medications   Medication Sig Start Date End Date Taking? Authorizing Provider  benztropine (COGENTIN) 1 MG tablet Take 1 mg by mouth 2 (two) times daily.   Yes Historical Provider, MD  clozapine (CLOZARIL) 200 MG tablet Take 250-600 mg by mouth 2 (two) times daily. Takes  in the morning and takes 600 MG in the evening   Yes Historical Provider, MD  docusate sodium (COLACE) 100 MG capsule Take 200 mg by mouth daily as needed for mild constipation.    Yes Historical Provider, MD  LORazepam (ATIVAN) 2 MG tablet Take 2 mg by mouth every 6 (six) hours as needed for anxiety.   Yes Historical Provider, MD  metFORMIN (GLUCOPHAGE-XR) 750 MG 24 hr tablet Take 750 mg by mouth daily with breakfast.   Yes Historical Provider, MD  Multiple Vitamin (MULTIVITAMIN WITH MINERALS) TABS tablet Take 1 tablet by mouth daily.   Yes Historical Provider, MD  senna (SENOKOT) 8.6 MG TABS tablet Take 1 tablet by mouth 2 (two) times daily.   Yes Historical Provider, MD  valproic acid (DEPAKENE) 250 MG capsule Take 1,250 mg by mouth 2 (two) times daily.   Yes Historical Provider, MD  folic acid (FOLVITE) 1 MG tablet Take 1 mg by mouth daily.    Historical Provider, MD   BP 128/74 mmHg  Pulse 104  Temp(Src) 98 F (36.7 C) (Oral)  Resp 20  SpO2 94%  LMP 06/26/2015 Physical Exam  Constitutional: She is oriented to person, place, and time. She appears well-developed and well-nourished.  HENT:  Head: Normocephalic and atraumatic.  Mouth/Throat: Oropharynx is clear and moist. No oropharyngeal exudate.  Eyes: Conjunctivae and EOM are normal. Pupils are equal, round, and reactive to light. Right eye exhibits no discharge. Left eye exhibits no discharge. No scleral icterus.  Neck: Normal range of motion. Neck supple.  Cardiovascular: Normal rate, regular rhythm, normal heart sounds and intact distal pulses.   Pulmonary/Chest:  Effort normal and breath sounds normal. No respiratory distress. She has no wheezes. She has no rales. She exhibits no tenderness.  Abdominal: Soft. Bowel sounds are normal. She exhibits no distension and no mass. There is no tenderness. There is no rebound and no guarding.  Musculoskeletal: Normal range of motion. She exhibits no edema.  Lymphadenopathy:    She has no cervical adenopathy.  Neurological: She is alert and oriented to person, place, and time.  Skin: Skin is warm and dry.  Psychiatric: She has a normal mood and affect. Her speech is normal and behavior is normal. She expresses suicidal ideation.  Nursing note and vitals reviewed.   ED Course  Procedures (including critical care time) Labs Review Labs Reviewed  COMPREHENSIVE METABOLIC PANEL - Abnormal; Notable for the following:    ALT 11 (*)    All other components within normal limits  ACETAMINOPHEN LEVEL - Abnormal; Notable for the following:    Acetaminophen (Tylenol), Serum <10 (*)    All other components within normal limits  CBC - Abnormal; Notable for the following:    RBC 3.80 (*)    Hemoglobin 11.3 (*)    HCT 35.4 (*)    All other components within normal limits  URINE RAPID DRUG SCREEN, HOSP PERFORMED - Abnormal; Notable for the following:    Benzodiazepines POSITIVE (*)    All other components within normal limits  ETHANOL  SALICYLATE LEVEL    Imaging Review No results found. I have personally reviewed and evaluated these images and lab results as part of my medical decision-making.   EKG Interpretation None      MDM   Final diagnoses:  Suicidal ideation    Pt presents with SI. VSS. Exam unremarkable. Labs unremarkable. Pt medically cleared. Consulted TTS. Behavioral health evaluated pt and recommend that pt can be discharged back home to her group home.     Satira Sark [place], [place] 07/14/15 4540  Rolland Porter, MD 07/25/15 [phone]

## 2015-07-14 NOTE — Discharge Instructions (Signed)
Continue taking your home medications as prescribed.  

## 2015-08-12 ENCOUNTER — Emergency Department (HOSPITAL_COMMUNITY)
Admission: EM | Admit: 2015-08-12 | Discharge: 2015-08-12 | Disposition: A | Discharge status: Home / Self Care | Payer: Medicaid Other | Attending: Emergency Medicine | Admitting: Emergency Medicine

## 2015-08-12 ENCOUNTER — Encounter (HOSPITAL_COMMUNITY): Payer: Self-pay | Admitting: Emergency Medicine

## 2015-08-12 ENCOUNTER — Encounter (HOSPITAL_COMMUNITY): Payer: Self-pay

## 2015-08-12 ENCOUNTER — Emergency Department (HOSPITAL_COMMUNITY)
Admission: EM | Admit: 2015-08-12 | Discharge: 2015-08-12 | Disposition: A | Discharge status: Home / Self Care | Payer: Medicare Other

## 2015-08-12 DIAGNOSIS — R410 Disorientation, unspecified: Secondary | ICD-10-CM | POA: Diagnosis present

## 2015-08-12 DIAGNOSIS — F17210 Nicotine dependence, cigarettes, uncomplicated: Secondary | ICD-10-CM | POA: Insufficient documentation

## 2015-08-12 NOTE — ED Notes (Signed)
After calling x 3, pt continued not found.

## 2015-08-12 NOTE — ED Notes (Signed)
Pt here earlier today by EMS and left right after triage.  Pt returned.  Pt states she fell earlier today when she was running away from group home and fell striking throat.  Pt came here originally by 911.  Pt is eating honey bun and drinking pepsi.  Pt unable to tell me why she is here. States her throat is better and she wants me to call group home.  ? " Are they coming to get me"

## 2015-08-12 NOTE — ED Notes (Signed)
Pt not found in lobby 

## 2015-08-12 NOTE — ED Notes (Signed)
Per group home provider-patient does not need to seen-states she ran away which is typical behavior

## 2015-08-12 NOTE — ED Notes (Signed)
Per EMS was walking down [address] when she injured her throat-when EMS arrived, she was eating chicken, no s/s's of distress

## 2015-08-21 ENCOUNTER — Emergency Department (HOSPITAL_COMMUNITY)
Admission: EM | Admit: 2015-08-21 | Discharge: 2015-08-21 | Disposition: A | Discharge status: Home / Self Care | Payer: Medicaid Other | Attending: Emergency Medicine | Admitting: Emergency Medicine

## 2015-08-21 ENCOUNTER — Encounter (HOSPITAL_COMMUNITY): Payer: Self-pay

## 2015-08-21 DIAGNOSIS — Z79899 Other long term (current) drug therapy: Secondary | ICD-10-CM | POA: Diagnosis not present

## 2015-08-21 DIAGNOSIS — E119 Type 2 diabetes mellitus without complications: Secondary | ICD-10-CM | POA: Insufficient documentation

## 2015-08-21 DIAGNOSIS — R45851 Suicidal ideations: Secondary | ICD-10-CM | POA: Diagnosis present

## 2015-08-21 DIAGNOSIS — Z87891 Personal history of nicotine dependence: Secondary | ICD-10-CM | POA: Diagnosis not present

## 2015-08-21 DIAGNOSIS — F209 Schizophrenia, unspecified: Secondary | ICD-10-CM | POA: Insufficient documentation

## 2015-08-21 DIAGNOSIS — Z7984 Long term (current) use of oral hypoglycemic drugs: Secondary | ICD-10-CM | POA: Insufficient documentation

## 2015-08-21 DIAGNOSIS — Z3202 Encounter for pregnancy test, result negative: Secondary | ICD-10-CM | POA: Insufficient documentation

## 2015-08-21 HISTORY — DX: Type 2 diabetes mellitus without complications: E11.9

## 2015-08-21 LAB — COMPREHENSIVE METABOLIC PANEL
ALT: 11 U/L — ABNORMAL LOW (ref 14–54)
ANION GAP: 7 (ref 5–15)
AST: 16 U/L (ref 15–41)
Albumin: 3.5 g/dL (ref 3.5–5.0)
Alkaline Phosphatase: 52 U/L (ref 38–126)
BUN: 13 mg/dL (ref 6–20)
CHLORIDE: 109 mmol/L (ref 101–111)
CO2: 25 mmol/L (ref 22–32)
Calcium: 9.2 mg/dL (ref 8.9–10.3)
Creatinine, Ser: 0.52 mg/dL (ref 0.44–1.00)
Glucose, Bld: 77 mg/dL (ref 65–99)
POTASSIUM: 4.3 mmol/L (ref 3.5–5.1)
Sodium: 141 mmol/L (ref 135–145)
Total Bilirubin: 0.3 mg/dL (ref 0.3–1.2)
Total Protein: 7.2 g/dL (ref 6.5–8.1)

## 2015-08-21 LAB — RAPID URINE DRUG SCREEN, HOSP PERFORMED
Amphetamines: NOT DETECTED
BARBITURATES: NOT DETECTED
Benzodiazepines: NOT DETECTED
COCAINE: NOT DETECTED
Opiates: NOT DETECTED
TETRAHYDROCANNABINOL: NOT DETECTED

## 2015-08-21 LAB — CBC
HCT: 34.4 % — ABNORMAL LOW (ref 36.0–46.0)
Hemoglobin: 11.2 g/dL — ABNORMAL LOW (ref 12.0–15.0)
MCH: 29.6 pg (ref 26.0–34.0)
MCHC: 32.6 g/dL (ref 30.0–36.0)
MCV: 91 fL (ref 78.0–100.0)
PLATELETS: 207 10*3/uL (ref 150–400)
RBC: 3.78 MIL/uL — AB (ref 3.87–5.11)
RDW: 14.1 % (ref 11.5–15.5)
WBC: 6.6 10*3/uL (ref 4.0–10.5)

## 2015-08-21 LAB — ETHANOL

## 2015-08-21 LAB — I-STAT BETA HCG BLOOD, ED (MC, WL, AP ONLY)

## 2015-08-21 LAB — ACETAMINOPHEN LEVEL

## 2015-08-21 LAB — SALICYLATE LEVEL

## 2015-08-21 NOTE — ED Notes (Addendum)
PT ARRIVED VIA EMS STATING "I WANT TO KILL MYSELF BY SCRATCHING HER ARMS WITH HER NAILS, BECAUSE I AM TIRED OF LIVING." PER EMS, THE PT WAS FOUND IN THE BACK OF A POLICE CAR, BECAUSE SHE RAN AWAY FROM HER GROUP HOME TO BUY A BAG OF DORITOS. SHE STATES THAT THEY DON'T FEED HER THERE, AND SHE DOES NOT WANT TO GO BACK THERE. THE GROUP HOME FILED A MISSING PERSONS REPORT ON HER. THE GROUP HOME STATES THAT SHE IS AN ELOPEMENT RISK ALL THE TIME WHEN SHE CAN NOT EAT WHEN SHE WANTS TO. NO OBVIOUS INJURIES AT THIS TIME.

## 2015-08-21 NOTE — Discharge Instructions (Signed)
Schizophrenia °Schizophrenia is a mental illness. It may cause disturbed or disorganized thinking, speech, or behavior. People with schizophrenia have problems functioning in one or more areas of life: work, school, home, or relationships. People with schizophrenia are at increased risk for suicide, certain chronic physical illnesses, and unhealthy behaviors, such as smoking and drug use. °People who have family members with schizophrenia are at higher risk of developing the illness. Schizophrenia affects men and women equally but usually appears at an earlier age (teenage or early adult years) in men.  °SYMPTOMS °The earliest symptoms are often subtle (prodrome) and may go unnoticed until the illness becomes more severe (first-break psychosis). Symptoms of schizophrenia may be continuous or may come and go in severity. Episodes often are triggered by major life events, such as family stress, college, military service, marriage, pregnancy or child birth, divorce, or loss of a loved one. People with schizophrenia may see, hear, or feel things that do not exist (hallucinations). They may have false beliefs in spite of obvious proof to the contrary (delusions). Sometimes speech is incoherent or behavior is odd or withdrawn.  °DIAGNOSIS °Schizophrenia is diagnosed through an assessment by your caregiver. Your caregiver will ask questions about your thoughts, behavior, mood, and ability to function in daily life. Your caregiver may ask questions about your medical history and use of alcohol or drugs, including prescription medication. Your caregiver may also order blood tests and imaging exams. Certain medical conditions and substances can cause symptoms that resemble schizophrenia. Your caregiver may refer you to a mental health specialist for evaluation. There are three major criterion for a diagnosis of schizophrenia: °· Two or more of the following five symptoms are present for a month or longer: °¨ Delusions. Often  the delusions are that you are being attacked, harassed, cheated, persecuted or conspired against (persecutory delusions). °¨ Hallucinations.   °¨ Disorganized speech that does not make sense to others. °¨ Grossly disorganized (confused or unfocused) behavior or extremely overactive or underactive motor activity (catatonia). °¨ Negative symptoms such as bland or blunted emotions (flat affect), loss of will power (avolition), and withdrawal from social contacts (social isolation). °· Level of functioning in one or more major areas of life (work, school, relationships, or self-care) is markedly below the level of functioning before the onset of illness.   °· There are continuous signs of illness (either mild symptoms or decreased level of functioning) for at least 6 months or longer. °TREATMENT  °Schizophrenia is a long-term illness. It is best controlled with continuous treatment rather than treatment only when symptoms occur. The following treatments are used to manage schizophrenia: °· Medication--Medication is the most effective and important form of treatment for schizophrenia. Antipsychotic medications are usually prescribed to help manage schizophrenia. Other types of medication may be added to relieve any symptoms that may occur despite the use of antipsychotic medications. °· Counseling or talk therapy--Individual, group, or family counseling may be helpful in providing education, support, and guidance. Many people with schizophrenia also benefit from social skills and job skills (vocational) training. °A combination of medication and counseling is best for managing the disorder over time. A procedure in which electricity is applied to the brain through the scalp (electroconvulsive therapy) may be used to treat catatonic schizophrenia or schizophrenia in people who cannot take or do not respond to medication and counseling. °  °This information is not intended to replace advice given to you by your health  care provider. Make sure you discuss any questions you have with   your health care provider. °  °Document Released: 04/27/2000 Document Revised: 05/21/2014 Document Reviewed: 07/23/2012 °Elsevier Interactive Patient Education ©2016 [employer] ° °

## 2015-08-21 NOTE — ED Notes (Signed)
Bed: WA09 Expected date:  Expected time:  Means of arrival:  Comments: EMS- SI, ran away from group home

## 2015-08-21 NOTE — ED Provider Notes (Signed)
CSN: [id number]     Arrival date & time 08/21/15  1307 History   First MD Initiated Contact with Patient 08/21/15 1309     Chief Complaint  Patient presents with  . Suicidal     (Consider location/radiation/quality/duration/timing/severity/associated sxs/prior Treatment) HPI Comments: 30 year old female with past medical history including mental retardation, schizoaffective schizophrenia, type 2 diabetes mellitus who presents with suicidal ideation. The patient was brought in by EMS and history obtained primarily from EMS and a group home care provider present. EMS states the patient was found in the back of the police car because she ran away from her group home to buy Doritos. She stated that she wanted to kill herself by scratching her arms with her nails. She stated that they do not feed her there and she wanted food. The group home told EMS that she is a constant elopement risk as she frequently flees when she does not get to eat what she wants to. Care provider from group home here reports that the patient constantly runs or reports suicidal ideation when she does not get food. She states that the patient has adequate food including 3 meals daily at the group home but has a long history of endorsing suicidal ideation when she does not get what she wants. The group home requested that the patient be allowed to go home but she had to be brought in due to EMS protocol.  The history is provided by the EMS personnel and a caregiver.    Past Medical History  Diagnosis Date  . Schizo-affective schizophrenia (HCC)   . Retardation   . Diabetes mellitus without complication (HCC)    History reviewed. No pertinent past surgical history. History reviewed. No pertinent family history. Social History  Substance Use Topics  . Smoking status: Former Smoker    Types: Cigarettes  . Smokeless tobacco: Never Used  . Alcohol Use: No   OB History    No data available     Review of Systems 10  Systems reviewed and are negative for acute change except as noted in the HPI.    Allergies  Review of patient's allergies indicates no known allergies.  Home Medications   Prior to Admission medications   Medication Sig Start Date End Date Taking? Authorizing Provider  benztropine (COGENTIN) 1 MG tablet Take 1 mg by mouth 2 (two) times daily.   Yes Historical Provider, MD  clozapine (CLOZARIL) 200 MG tablet Take 250-600 mg by mouth 2 (two) times daily. Takes  in the morning and takes 600 MG in the evening   Yes Historical Provider, MD  docusate sodium (COLACE) 100 MG capsule Take 200 mg by mouth daily as needed for mild constipation.    Yes Historical Provider, MD  folic acid (FOLVITE) 1 MG tablet Take 1 mg by mouth daily.   Yes Historical Provider, MD  LORazepam (ATIVAN) 2 MG tablet Take 2 mg by mouth every 6 (six) hours as needed for anxiety.   Yes Historical Provider, MD  metFORMIN (GLUCOPHAGE-XR) 750 MG 24 hr tablet Take 750 mg by mouth daily with breakfast.   Yes Historical Provider, MD  Multiple Vitamin (MULTIVITAMIN WITH MINERALS) TABS tablet Take 1 tablet by mouth daily.   Yes Historical Provider, MD  senna (SENOKOT) 8.6 MG TABS tablet Take 1 tablet by mouth 2 (two) times daily.   Yes Historical Provider, MD  valproic acid (DEPAKENE) 250 MG capsule Take 1,250 mg by mouth 2 (two) times daily.   Yes Historical Provider,  MD   BP 129/91 mmHg  Pulse 104  Temp(Src) 98.1 F (36.7 C) (Oral)  Resp 18  Ht 5\' 5"  (1.651 m)  Wt 170 lb (77.111 kg)  BMI 28.29 kg/m2  SpO2 93% Physical Exam  Constitutional: She is oriented to person, place, and time. She appears well-developed and well-nourished. No distress.  HENT:  Head: Normocephalic and atraumatic.  Moist mucous membranes  Eyes: Conjunctivae are normal. Pupils are equal, round, and reactive to light.  Neck: Neck supple.  Cardiovascular: Normal rate, regular rhythm and normal heart sounds.   No murmur heard. Pulmonary/Chest:  Effort normal and breath sounds normal.  Abdominal: Soft. Bowel sounds are normal. She exhibits no distension. There is no tenderness.  Obese abdomen  Musculoskeletal: She exhibits no edema.  Neurological: She is alert and oriented to person, place, and time.  Fluent speech  Skin: Skin is warm and dry.  Psychiatric:  Calm ,avoids eye contact; repeatedly asks for food, won't answer questions about SI  Nursing note and vitals reviewed.   ED Course  Procedures (including critical care time) Labs Review Labs Reviewed  COMPREHENSIVE METABOLIC PANEL - Abnormal; Notable for the following:    ALT 11 (*)    All other components within normal limits  ACETAMINOPHEN LEVEL - Abnormal; Notable for the following:    Acetaminophen (Tylenol), Serum <10 (*)    All other components within normal limits  CBC - Abnormal; Notable for the following:    RBC 3.78 (*)    Hemoglobin 11.2 (*)    HCT 34.4 (*)    All other components within normal limits  ETHANOL  SALICYLATE LEVEL  URINE RAPID DRUG SCREEN, HOSP PERFORMED  I-STAT BETA HCG BLOOD, ED ([facility], [facility], [facility] ONLY)    Imaging Review No results found. I have personally reviewed and evaluated these lab results as part of my medical decision-making.   EKG Interpretation None      MDM   Final diagnoses:  None   Pt w/ above medical hx Presents by EMS after she ran away from her group home and stated that she wanted to kill herself because they would not let her have food. On exam, she was comfortable and in no acute distress. Vital signs stable. Moist mucous membranes and no abdominal tenderness. The group home care provider Pres. is well familiar with the patient and states that she has an extensive history of running away when she does not get what she wants which is food. The patient has had sandwich and multiple snacks here and continues to repeatedly ask for food. I reviewed her chart which has extensive Behavioral Health notes, including recent  note from a few weeks ago describing a very similar scenario of the patient endorsing SI when she did not get food. Her lab work here is notable only for anemia that is stable from previous lab work and otherwise no evidence of dehydration. UDS negative. I feel that given the patient's pattern of frequent elopement and endorsement of SI related to not getting food, she is safe for discharge back to the group home. The caregiver here states that the patient will be allowed to go back to the group home and they are well familiar with this behavior. Pt discharged in satisfactory condition.   Laurence Spatesachel Morgan Jceon Alverio, MD 08/21/15 1430

## 2015-09-11 ENCOUNTER — Emergency Department (HOSPITAL_COMMUNITY)
Admission: EM | Admit: 2015-09-11 | Discharge: 2015-09-12 | Disposition: A | Discharge status: Home / Self Care | Payer: Medicaid Other | Attending: Emergency Medicine | Admitting: Emergency Medicine

## 2015-09-11 ENCOUNTER — Encounter (HOSPITAL_COMMUNITY): Payer: Self-pay | Admitting: Emergency Medicine

## 2015-09-11 DIAGNOSIS — Z79899 Other long term (current) drug therapy: Secondary | ICD-10-CM | POA: Diagnosis not present

## 2015-09-11 DIAGNOSIS — F259 Schizoaffective disorder, unspecified: Secondary | ICD-10-CM | POA: Insufficient documentation

## 2015-09-11 DIAGNOSIS — E119 Type 2 diabetes mellitus without complications: Secondary | ICD-10-CM | POA: Insufficient documentation

## 2015-09-11 DIAGNOSIS — F4325 Adjustment disorder with mixed disturbance of emotions and conduct: Secondary | ICD-10-CM | POA: Insufficient documentation

## 2015-09-11 DIAGNOSIS — R45851 Suicidal ideations: Secondary | ICD-10-CM | POA: Insufficient documentation

## 2015-09-11 DIAGNOSIS — Z7984 Long term (current) use of oral hypoglycemic drugs: Secondary | ICD-10-CM | POA: Insufficient documentation

## 2015-09-11 DIAGNOSIS — Z87891 Personal history of nicotine dependence: Secondary | ICD-10-CM | POA: Diagnosis not present

## 2015-09-11 LAB — COMPREHENSIVE METABOLIC PANEL
ALK PHOS: 53 U/L (ref 38–126)
ALT: 16 U/L (ref 14–54)
ANION GAP: 10 (ref 5–15)
AST: 24 U/L (ref 15–41)
Albumin: 3.7 g/dL (ref 3.5–5.0)
BILIRUBIN TOTAL: 0.3 mg/dL (ref 0.3–1.2)
BUN: 14 mg/dL (ref 6–20)
CALCIUM: 9.2 mg/dL (ref 8.9–10.3)
CO2: 23 mmol/L (ref 22–32)
CREATININE: 0.52 mg/dL (ref 0.44–1.00)
Chloride: 108 mmol/L (ref 101–111)
Glucose, Bld: 111 mg/dL — ABNORMAL HIGH (ref 65–99)
Potassium: 3.8 mmol/L (ref 3.5–5.1)
Sodium: 141 mmol/L (ref 135–145)
TOTAL PROTEIN: 7.5 g/dL (ref 6.5–8.1)

## 2015-09-11 LAB — DIFFERENTIAL
BASOS ABS: 0 10*3/uL (ref 0.0–0.1)
Basophils Relative: 0 %
EOS ABS: 0.1 10*3/uL (ref 0.0–0.7)
Eosinophils Relative: 3 %
LYMPHS ABS: 2.1 10*3/uL (ref 0.7–4.0)
Lymphocytes Relative: 40 %
Monocytes Absolute: 0.4 10*3/uL (ref 0.1–1.0)
Monocytes Relative: 7 %
NEUTROS PCT: 50 %
Neutro Abs: 2.7 10*3/uL (ref 1.7–7.7)

## 2015-09-11 LAB — CBC
HCT: 37.3 % (ref 36.0–46.0)
HEMOGLOBIN: 12 g/dL (ref 12.0–15.0)
MCH: 30 pg (ref 26.0–34.0)
MCHC: 32.2 g/dL (ref 30.0–36.0)
MCV: 93.3 fL (ref 78.0–100.0)
Platelets: 189 10*3/uL (ref 150–400)
RBC: 4 MIL/uL (ref 3.87–5.11)
RDW: 14.6 % (ref 11.5–15.5)
WBC: 5.3 10*3/uL (ref 4.0–10.5)

## 2015-09-11 LAB — RAPID URINE DRUG SCREEN, HOSP PERFORMED
Amphetamines: NOT DETECTED
Barbiturates: NOT DETECTED
Benzodiazepines: POSITIVE — AB
Cocaine: NOT DETECTED
OPIATES: NOT DETECTED
TETRAHYDROCANNABINOL: NOT DETECTED

## 2015-09-11 LAB — ACETAMINOPHEN LEVEL

## 2015-09-11 LAB — ETHANOL

## 2015-09-11 LAB — SALICYLATE LEVEL

## 2015-09-11 LAB — POC URINE PREG, ED: Preg Test, Ur: NEGATIVE

## 2015-09-11 MED ORDER — LORAZEPAM 1 MG PO TABS: 2.0000 mg | ORAL_TABLET | Freq: Four times a day (QID) | ORAL | Status: DC | PRN

## 2015-09-11 MED ORDER — VALPROIC ACID 250 MG PO CAPS
1250.0000 mg | ORAL_CAPSULE | Freq: Two times a day (BID) | ORAL | Status: DC
  Administered 2015-09-11 – 2015-09-12 (×2): 1250 mg via ORAL
  Filled 2015-09-11 (×3): qty 5

## 2015-09-11 MED ORDER — FOLIC ACID 1 MG PO TABS
1.0000 mg | ORAL_TABLET | Freq: Every day | ORAL | Status: DC
  Administered 2015-09-11 – 2015-09-12 (×2): 1 mg via ORAL
  Filled 2015-09-11 (×2): qty 1

## 2015-09-11 MED ORDER — CLOZAPINE 25 MG PO TABS
250.0000 mg | ORAL_TABLET | Freq: Every morning | ORAL | Status: DC
  Administered 2015-09-12: 250 mg via ORAL
  Filled 2015-09-11: qty 2

## 2015-09-11 MED ORDER — CLOZAPINE 100 MG PO TABS
600.0000 mg | ORAL_TABLET | Freq: Every day | ORAL | Status: DC
  Administered 2015-09-11: 600 mg via ORAL
  Filled 2015-09-11 (×2): qty 6

## 2015-09-11 MED ORDER — SENNA 8.6 MG PO TABS
1.0000 | ORAL_TABLET | Freq: Two times a day (BID) | ORAL | Status: DC
  Administered 2015-09-11 – 2015-09-12 (×2): 8.6 mg via ORAL
  Filled 2015-09-11 (×2): qty 1

## 2015-09-11 MED ORDER — ADULT MULTIVITAMIN W/MINERALS CH
1.0000 | ORAL_TABLET | Freq: Every day | ORAL | Status: DC
  Administered 2015-09-11 – 2015-09-12 (×2): 1 via ORAL
  Filled 2015-09-11 (×2): qty 1

## 2015-09-11 MED ORDER — METFORMIN HCL ER 750 MG PO TB24
750.0000 mg | ORAL_TABLET | Freq: Every day | ORAL | Status: DC
  Administered 2015-09-12: 750 mg via ORAL
  Filled 2015-09-11 (×2): qty 1

## 2015-09-11 MED ORDER — DOCUSATE SODIUM 100 MG PO CAPS: 200.0000 mg | ORAL_CAPSULE | Freq: Every day | ORAL | Status: DC | PRN

## 2015-09-11 MED ORDER — BENZTROPINE MESYLATE 1 MG PO TABS
1.0000 mg | ORAL_TABLET | Freq: Two times a day (BID) | ORAL | Status: DC
  Administered 2015-09-11 – 2015-09-12 (×2): 1 mg via ORAL
  Filled 2015-09-11 (×2): qty 1

## 2015-09-11 NOTE — BHH Counselor (Signed)
Patients group home came to ED requesting information regarding discharge reporting that this is normal behavior for her to call EMS and report that she is suicidal if she does not get what she wants (food) when she leaves the group home per Consulting civil engineerCharge RN.   Brittany PokeJoVea Lujean Ebright, LCSW Therapeutic Triage Specialist [facility] 09/11/2015 10:50 PM

## 2015-09-11 NOTE — ED Notes (Signed)
PT belongings in locker 2426

## 2015-09-11 NOTE — Progress Notes (Addendum)
[facility] Clozapine program Update: Patient & MD registered; Labs entered and acceptable.  Per Clozapine [facility] program, acceptable to continue medication. ANC monitoring required every 4 weeks.  Will resume home regimen of Clozaril 250mg  QD and 600mg  QHS.  Thanks, Dorethea ClanMary Ann Braelyn Bordonaro, PharmD 09/11/2015     Addendum: Will sign off from clozaril protocol but pharmacy will conitnue to follow peripherally.

## 2015-09-11 NOTE — ED Notes (Signed)
Bed: WBH37 Expected date:  Expected time:  Means of arrival:  Comments: Room 30 

## 2015-09-11 NOTE — ED Notes (Signed)
Pt walked out of ED again after being told she cannot have any more food. Was followed by staff and security. Pt came back after getting several items from vending machine. Pt being IVC'd by MD.

## 2015-09-11 NOTE — ED Provider Notes (Signed)
CSN: [id number]     Arrival date & time 09/11/15  1415 History   None    Chief Complaint  Patient presents with  . Suicidal   HPI Patient presents to the emergency room for evaluation of depression and suicidal ideation. Patient has history of schizophrenia and mental retardation. She lives in a group home. Patient was sent into the emergency room because she was attempting to cut herself with a piece of glass. Patient states she is unhappy living at her group home. The patient's current complaint is that she is hungry. She wants to have something to eat and drink. Past Medical History  Diagnosis Date  . Schizo-affective schizophrenia (HCC)   . Retardation   . Diabetes mellitus without complication (HCC)    History reviewed. No pertinent past surgical history. History reviewed. No pertinent family history. Social History  Substance Use Topics  . Smoking status: Former Smoker    Types: Cigarettes  . Smokeless tobacco: Never Used  . Alcohol Use: No   OB History    No data available     Review of Systems  All other systems reviewed and are negative.     Allergies  Review of patient's allergies indicates no known allergies.  Home Medications   Prior to Admission medications   Medication Sig Start Date End Date Taking? Authorizing Provider  benztropine (COGENTIN) 1 MG tablet Take 1 mg by mouth 2 (two) times daily.   Yes Historical Provider, MD  clozapine (CLOZARIL) 200 MG tablet Take 250-600 mg by mouth 2 (two) times daily. Takes 250mg  in the morning and takes 600 MG in the evening   Yes Historical Provider, MD  docusate sodium (COLACE) 100 MG capsule Take 200 mg by mouth daily as needed for mild constipation.    Yes Historical Provider, MD  folic acid (FOLVITE) 1 MG tablet Take 1 mg by mouth daily.   Yes Historical Provider, MD  LORazepam (ATIVAN) 2 MG tablet Take 2 mg by mouth every 6 (six) hours as needed for anxiety.   Yes Historical Provider, MD  metFORMIN (GLUCOPHAGE-XR)  750 MG 24 hr tablet Take 750 mg by mouth daily with breakfast.   Yes Historical Provider, MD  Multiple Vitamin (MULTIVITAMIN WITH MINERALS) TABS tablet Take 1 tablet by mouth daily.   Yes Historical Provider, MD  senna (SENOKOT) 8.6 MG TABS tablet Take 1 tablet by mouth 2 (two) times daily.   Yes Historical Provider, MD  valproic acid (DEPAKENE) 250 MG capsule Take 1,250 mg by mouth 2 (two) times daily.   Yes Historical Provider, MD   BP 121/86 mmHg  Pulse 107  Temp(Src) 97.9 F (36.6 C) (Oral)  Resp 18  SpO2 94% Physical Exam  Constitutional: She appears well-developed and well-nourished. No distress.  HENT:  Head: Normocephalic and atraumatic.  Right Ear: External ear normal.  Left Ear: External ear normal.  Eyes: Conjunctivae are normal. Right eye exhibits no discharge. Left eye exhibits no discharge. No scleral icterus.  Neck: Neck supple. No tracheal deviation present.  Cardiovascular: Normal rate, regular rhythm and intact distal pulses.   Pulmonary/Chest: Effort normal and breath sounds normal. No stridor. No respiratory distress. She has no wheezes. She has no rales.  Abdominal: Soft. Bowel sounds are normal. She exhibits no distension. There is no tenderness. There is no rebound and no guarding.  Musculoskeletal: She exhibits no edema or tenderness.  Patient does not have any evidence of fresh wounds on her extremities, there are some old circular wounds  that are approximately 1-2 mm in size  Neurological: She is alert. She has normal strength. No cranial nerve deficit (no facial droop, extraocular movements intact, no slurred speech) or sensory deficit. She exhibits normal muscle tone. She displays no seizure activity. Coordination normal.  Skin: Skin is warm and dry. No rash noted.  Psychiatric: She has a normal mood and affect.  Nursing note and vitals reviewed.   ED Course  Procedures (including critical care time) Labs Review Labs Reviewed  COMPREHENSIVE METABOLIC  PANEL - Abnormal; Notable for the following:    Glucose, Bld 111 (*)    All other components within normal limits  ACETAMINOPHEN LEVEL - Abnormal; Notable for the following:    Acetaminophen (Tylenol), Serum <10 (*)    All other components within normal limits  URINE RAPID DRUG SCREEN, HOSP PERFORMED - Abnormal; Notable for the following:    Benzodiazepines POSITIVE (*)    All other components within normal limits  ETHANOL  SALICYLATE LEVEL  CBC  DIFFERENTIAL  POC URINE PREG, ED      MDM     Will ask for psych assessment.  Labs reviewed.  Pt is medically stable.    Linwood Dibbles, MD 09/11/15 [phone]
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
## 2015-09-11 NOTE — BH Assessment (Signed)
Tele Assessment Note   Brittany Velez is an 30 y.o. female. IVCd by EDP. Pt reports SI with a plan to cut her throat. Pt cut her arm with a piece of glass today in suicide attempt. Pt reports 1 previous SI attempt. Pt denies HI. Pt has a history of auditory and visual hallucinations. Pt states she wants to kill herself because she does not like her current group home. Pt reports no family supports. Pt denies SA. Pt denies abuse. Pt has an obsession with food. Pt was trying to elope from the ED to get food. Pt has been diagnosed with Schizoaffective Disorder, depressive type, OCD, and MR. Pt's IQ is a reported 50. Pt is currently prescribed Risperdal and Haldol. Pt has an ACT team with [facility]. Pt has a guardian [name]).   Diagnosis:  F25.1 Schizoaffective, depressive type; F42 OCD, MR   Past Medical History:  Past Medical History  Diagnosis Date  . Schizo-affective schizophrenia (HCC)   . Retardation   . Diabetes mellitus without complication (HCC)     History reviewed. No pertinent past surgical history.  Family History: History reviewed. No pertinent family history.  Social History:  reports that she has quit smoking. Her smoking use included Cigarettes. She has never used smokeless tobacco. She reports that she does not drink alcohol or use illicit drugs.  Additional Social History:  Alcohol / Drug Use Pain Medications: pt denies Prescriptions: risperdal, Haldol Over the Counter: pt denies History of alcohol / drug use?: No history of alcohol / drug abuse Longest period of sobriety (when/how long): NA  CIWA: CIWA-Ar BP: 121/86 mmHg Pulse Rate: 107 COWS:    PATIENT STRENGTHS: (choose at least two) Active sense of humor Communication skills  Allergies: No Known Allergies  Home Medications:  (Not in a hospital admission)  OB/GYN Status:  No LMP recorded.  General Assessment Data [facility] of Assessment: [facility] TTS Assessment: In system Is this  a Tele or Face-to-Face Assessment?: Face-to-Face Is this an Initial Assessment or a Re-assessment for this encounter?: Initial Assessment Marital status: Single Maiden name: NA Is patient pregnant?: No Pregnancy Status: No Living Arrangements: Group Home Can pt return to current living arrangement?: Yes Admission Status: Voluntary Referral Source: Self/Family/Friend Insurance type: Medicare     Crisis Care Plan Living Arrangements: Group Home Legal Guardian: Other: Adult nurse ([facility])) Name of Psychiatrist: Vesta Mixer in the future; now-NP [name] Name of Therapist: none  Education Status Is patient currently in school?: No Current Grade: NA Highest grade of school patient has completed: 12th grade Name of school: NA Contact person: NA  Risk to self with the past 6 months Suicidal Ideation: Yes-Currently Present Has patient been a risk to self within the past 6 months prior to admission? : Yes Suicidal Intent: Yes-Currently Present Has patient had any suicidal intent within the past 6 months prior to admission? : Yes Is patient at risk for suicide?: Yes Suicidal Plan?: Yes-Currently Present Has patient had any suicidal plan within the past 6 months prior to admission? : Yes Specify Current Suicidal Plan: to cut her throat Access to Means: Yes Specify Access to Suicidal Means: access to glass What has been your use of drugs/alcohol within the last 12 months?: NA Previous Attempts/Gestures: Yes How many times?: 2 Other Self Harm Risks: NA Triggers for Past Attempts: Unpredictable Intentional Self Injurious Behavior: None Family Suicide History: Unknown Recent stressful life event(s): Other (Comment) (does not like her group home) Persecutory voices/beliefs?: Yes Depression:  Yes Depression Symptoms: Loss of interest in usual pleasures, Feeling worthless/self pity, Feeling angry/irritable Substance abuse history and/or treatment for substance abuse?:  No Suicide prevention information given to non-admitted patients: Not applicable  Risk to Others within the past 6 months Homicidal Ideation: No Does patient have any lifetime risk of violence toward others beyond the six months prior to admission? : No Thoughts of Harm to Others: No Current Homicidal Intent: No Current Homicidal Plan: No Access to Homicidal Means: No Identified Victim: NA History of harm to others?: Yes Assessment of Violence: In distant past Violent Behavior Description: threaten others Does patient have access to weapons?: No Criminal Charges Pending?: No Does patient have a court date: No Is patient on probation?: No  Psychosis Hallucinations: None noted Delusions: None noted  Mental Status Report Appearance/Hygiene: In scrubs Eye Contact: Poor Motor Activity: Freedom of movement Speech: Logical/coherent, Soft Level of Consciousness: Alert Mood: Sad Affect: Sad Anxiety Level: None Thought Processes: Relevant Judgement: Partial Orientation: Appropriate for developmental age Obsessive Compulsive Thoughts/Behaviors: None  Cognitive Functioning Concentration: Poor Memory: Recent Impaired IQ: Below Average Level of Function: 50 Insight: Poor Impulse Control: Poor Appetite: Good Weight Loss: 0 Weight Gain: 0 Sleep: No Change Total Hours of Sleep: 8 Vegetative Symptoms: None  ADLScreening [facility] Assessment Services) Patient's cognitive ability adequate to safely complete daily activities?: No Patient able to express need for assistance with ADLs?: Yes Independently performs ADLs?: Yes (appropriate for developmental age)  Prior Inpatient Therapy Prior Inpatient Therapy: Yes Prior Therapy Dates: multiple Prior Therapy Facilty/Provider(s): various since age 30 Reason for Treatment: Schizoaffective DO;   Prior Outpatient Therapy Prior Outpatient Therapy: Yes Prior Therapy Dates: Since Nov '16 Prior Therapy Facilty/Provider(s): [facility] ACTT services Reason for Treatment: Schizoaffective Does patient have an ACCT team?: Yes Does patient have Intensive In-House Services?  : No Does patient have [facility] services? : No Does patient have P4CC services?: No  ADL Screening (condition at time of admission) Patient's cognitive ability adequate to safely complete daily activities?: No Is the patient deaf or have difficulty hearing?: No Does the patient have difficulty seeing, even when wearing glasses/contacts?: No Does the patient have difficulty concentrating, remembering, or making decisions?: Yes Patient able to express need for assistance with ADLs?: Yes Does the patient have difficulty dressing or bathing?: No Independently performs ADLs?: Yes (appropriate for developmental age)       Abuse/Neglect Assessment (Assessment to be complete while patient is alone) Physical Abuse: Denies Verbal Abuse: Denies Sexual Abuse: Denies Exploitation of patient/patient's resources: Denies Self-Neglect: Denies     Merchant navy officerAdvance Directives (For Healthcare) Does patient have an advance directive?: No Would patient like information on creating an advanced directive?: No - patient declined information    Additional Information 1:1 In Past 12 Months?: No CIRT Risk: No Elopement Risk: Yes Does patient have medical clearance?: Yes     Disposition:  Disposition Initial Assessment Completed for this Encounter: Yes Disposition of Patient: Inpatient treatment program Type of inpatient treatment program: Adult  Emmit PomfretLevette,Juquan Reznick D 09/11/2015 4:36 PM

## 2015-09-11 NOTE — ED Notes (Signed)
Bed: [facility] Expected date:  Expected time:  Means of arrival:  Comments: Med Clearance

## 2015-09-11 NOTE — ED Notes (Signed)
Up to the desk asking for food

## 2015-09-11 NOTE — ED Notes (Signed)
Pt [facility] to [facility] 30[facility]

## 2015-09-11 NOTE — ED Notes (Signed)
Pt tried to elope. Went to [employer] illustr[employer] machine. Was able to get pt back to room with verbal persuasion from multiple staff members. Pt has been fed.

## 2015-09-11 NOTE — ED Notes (Signed)
Pt ambulatory to room from TCU, and up to the desk to ask for food.

## 2015-09-12 DIAGNOSIS — F4325 Adjustment disorder with mixed disturbance of emotions and conduct: Secondary | ICD-10-CM | POA: Diagnosis not present

## 2015-09-12 NOTE — ED Notes (Signed)
Patient noted in room. No complaints, stable, in no acute distress. 1:1 sitter at bedside.  

## 2015-09-12 NOTE — Consult Note (Signed)
[facility] Face-to-Face Psychiatry Consult   Reason for Consult:  Suicidal ideations Referring Physician:  EDP Patient Identification: Brittany Velez MRN:  [id number] Principal Diagnosis: Adjustment disorder with mixed disturbance of emotions and conduct Diagnosis:   Patient Active Problem List   Diagnosis Date Noted  . Adjustment disorder with mixed disturbance of emotions and conduct [F43.25] 07/08/2015    Priority: High  . Schizophrenia, schizoaffective, chronic with acute exacerbation (HCC) [F25.8] 06/06/2014  . Auditory hallucination [R44.0]   . Visual hallucination [R44.1]     Total Time spent with patient: 45 minutes  Subjective:   Brittany Velez is a 30 y.o. female patient does not warrant admission.  HPI:  30 yo female who presented to the ED with suicidal ideations after getting upset with her group home.  It is reported that when she does not get what she wants she acts out, consistent with her MR diagnosis.  Today, she is calm and cooperative, denies suicidal/homicidal ideations, hallucinations, and alcohol/drug abuse.  Stable for discharge back to her group home.  Past Psychiatric History: schizoaffective disorder  Risk to Self: Suicidal Ideation: Yes-Currently Present Suicidal Intent: Yes-Currently Present Is patient at risk for suicide?: Yes Suicidal Plan?: Yes-Currently Present Specify Current Suicidal Plan: to cut her throat Access to Means: Yes Specify Access to Suicidal Means: access to glass What has been your use of drugs/alcohol within the last 12 months?: NA How many times?: 2 Other Self Harm Risks: NA Triggers for Past Attempts: Unpredictable Intentional Self Injurious Behavior: None Risk to Others: Homicidal Ideation: No Thoughts of Harm to Others: No Current Homicidal Intent: No Current Homicidal Plan: No Access to Homicidal Means: No Identified Victim: NA History of harm to others?: Yes Assessment of Violence: In distant past Violent Behavior  Description: threaten others Does patient have access to weapons?: No Criminal Charges Pending?: No Does patient have a court date: No Prior Inpatient Therapy: Prior Inpatient Therapy: Yes Prior Therapy Dates: multiple Prior Therapy Facilty/Provider(s): various since age 11 Reason for Treatment: Schizoaffective DO;  Prior Outpatient Therapy: Prior Outpatient Therapy: Yes Prior Therapy Dates: Since Nov '16 Prior Therapy Facilty/Provider(s): [facility] ACTT services Reason for Treatment: Schizoaffective Does patient have an ACCT team?: Yes Does patient have Intensive In-House Services?  : No Does patient have [facility] services? : No Does patient have P4CC services?: No  Past Medical History:  Past Medical History  Diagnosis Date  . Schizo-affective schizophrenia (HCC)   . Retardation   . Diabetes mellitus without complication (HCC)    History reviewed. No pertinent past surgical history. Family History: History reviewed. No pertinent family history. Family Psychiatric  History: none Social History:  History  Alcohol Use No     History  Drug Use No    Social History   Social History  . Marital Status: Single    Spouse Name: N/A  . Number of Children: N/A  . Years of Education: N/A   Social History Main Topics  . Smoking status: Former Smoker    Types: Cigarettes  . Smokeless tobacco: Never Used  . Alcohol Use: No  . Drug Use: No  . Sexual Activity: Yes   Other Topics Concern  . None   Social History Narrative   Additional Social History:    Allergies:  No Known Allergies  Labs:  Results for orders placed or performed during the hospital encounter of 09/11/15 (from the past 48 hour(s))  Rapid urine drug screen (hospital performed)     Status: Abnormal   Collection   Time: 09/11/15  2:55 PM  Result Value Ref Range   Opiates NONE DETECTED NONE DETECTED   Cocaine NONE DETECTED NONE DETECTED   Benzodiazepines POSITIVE (A) NONE DETECTED    Amphetamines NONE DETECTED NONE DETECTED   Tetrahydrocannabinol NONE DETECTED NONE DETECTED   Barbiturates NONE DETECTED NONE DETECTED    Comment:        DRUG SCREEN FOR MEDICAL PURPOSES ONLY.  IF CONFIRMATION IS NEEDED FOR ANY PURPOSE, NOTIFY LAB WITHIN 5 DAYS.        LOWEST DETECTABLE LIMITS FOR URINE DRUG SCREEN Drug Class       Cutoff (ng/mL) Amphetamine      1000 Barbiturate      200 Benzodiazepine   213 Tricyclics       086 Opiates          300 Cocaine          300 THC              50   POC Urine Pregnancy, ED (do NOT order at [facility])     Status: None   Collection Time: 09/11/15  3:00 PM  Result Value Ref Range   Preg Test, Ur NEGATIVE NEGATIVE    Comment:        THE SENSITIVITY OF THIS METHODOLOGY IS >24 mIU/mL   Comprehensive metabolic panel     Status: Abnormal   Collection Time: 09/11/15  3:02 PM  Result Value Ref Range   Sodium 141 135 - 145 mmol/L   Potassium 3.8 3.5 - 5.1 mmol/L   Chloride 108 101 - 111 mmol/L   CO2 23 22 - 32 mmol/L   Glucose, Bld 111 (H) 65 - 99 mg/dL   BUN 14 6 - 20 mg/dL   Creatinine, Ser 0.52 0.44 - 1.00 mg/dL   Calcium 9.2 8.9 - 10.3 mg/dL   Total Protein 7.5 6.5 - 8.1 g/dL   Albumin 3.7 3.5 - 5.0 g/dL   AST 24 15 - 41 U/L   ALT 16 14 - 54 U/L   Alkaline Phosphatase 53 38 - 126 U/L   Total Bilirubin 0.3 0.3 - 1.2 mg/dL   GFR calc non Af Amer >60 >60 mL/min   GFR calc Af Amer >60 >60 mL/min    Comment: (NOTE) The eGFR has been calculated using the CKD EPI equation. This calculation has not been validated in all clinical situations. eGFR's persistently <60 mL/min signify possible Chronic Kidney Disease.    Anion gap 10 5 - 15  Ethanol     Status: None   Collection Time: 09/11/15  3:02 PM  Result Value Ref Range   Alcohol, Ethyl (B) <5 <5 mg/dL    Comment:        LOWEST DETECTABLE LIMIT FOR SERUM ALCOHOL IS 5 mg/dL FOR MEDICAL PURPOSES ONLY   Salicylate level     Status: None   Collection Time: 09/11/15  3:02 PM  Result  Value Ref Range   Salicylate Lvl <5.7 2.8 - 30.0 mg/dL  Acetaminophen level     Status: Abnormal   Collection Time: 09/11/15  3:02 PM  Result Value Ref Range   Acetaminophen (Tylenol), Serum <10 (L) 10 - 30 ug/mL    Comment:        THERAPEUTIC CONCENTRATIONS VARY SIGNIFICANTLY. A RANGE OF 10-30 ug/mL MAY BE AN EFFECTIVE CONCENTRATION FOR MANY PATIENTS. HOWEVER, SOME ARE BEST TREATED AT CONCENTRATIONS OUTSIDE THIS RANGE. ACETAMINOPHEN CONCENTRATIONS >150 ug/mL AT 4 HOURS AFTER INGESTION AND >50 ug/mL AT  12 HOURS AFTER INGESTION ARE OFTEN ASSOCIATED WITH TOXIC REACTIONS.   cbc     Status: None   Collection Time: 09/11/15  3:02 PM  Result Value Ref Range   WBC 5.3 4.0 - 10.5 K/uL   RBC 4.00 3.87 - 5.11 MIL/uL   Hemoglobin 12.0 12.0 - 15.0 g/dL   HCT 37.3 36.0 - 46.0 %   MCV 93.3 78.0 - 100.0 fL   MCH 30.0 26.0 - 34.0 pg   MCHC 32.2 30.0 - 36.0 g/dL   RDW 14.6 11.5 - 15.5 %   Platelets 189 150 - 400 K/uL  Differential     Status: None   Collection Time: 09/11/15  3:02 PM  Result Value Ref Range   Neutrophils Relative % 50 %   Neutro Abs 2.7 1.7 - 7.7 K/uL   Lymphocytes Relative 40 %   Lymphs Abs 2.1 0.7 - 4.0 K/uL   Monocytes Relative 7 %   Monocytes Absolute 0.4 0.1 - 1.0 K/uL   Eosinophils Relative 3 %   Eosinophils Absolute 0.1 0.0 - 0.7 K/uL   Basophils Relative 0 %   Basophils Absolute 0.0 0.0 - 0.1 K/uL    Current Facility-Administered Medications  Medication Dose Route Frequency Provider Last Rate Last Dose  . benztropine (COGENTIN) tablet 1 mg  1 mg Oral BID Dorie Rank, MD   1 mg at 09/12/15 0917  . cloZAPine (CLOZARIL) tablet 250 mg  250 mg Oral q morning - 10a Dorie Rank, MD   250 mg at 09/12/15 0017  . cloZAPine (CLOZARIL) tablet 600 mg  600 mg Oral QHS Dorie Rank, MD   600 mg at 09/11/15 2132  . docusate sodium (COLACE) capsule 200 mg  200 mg Oral Daily PRN Dorie Rank, MD      . folic acid (FOLVITE) tablet 1 mg  1 mg Oral Daily Dorie Rank, MD   1 mg at  09/12/15 0917  . LORazepam (ATIVAN) tablet 2 mg  2 mg Oral Q6H PRN Dorie Rank, MD      . metFORMIN (GLUCOPHAGE-XR) 24 hr tablet 750 mg  750 mg Oral Q breakfast Dorie Rank, MD   750 mg at 09/12/15 0815  . multivitamin with minerals tablet 1 tablet  1 tablet Oral Daily Dorie Rank, MD   1 tablet at 09/12/15 [phone]  . senna (SENOKOT) tablet 8.6 mg  1 tablet Oral BID Dorie Rank, MD   8.6 mg at 09/12/15 9675  . valproic acid (DEPAKENE) 250 MG capsule 1,250 mg  1,250 mg Oral BID Dorie Rank, MD   1,250 mg at 09/12/15 9163   Current Outpatient Prescriptions  Medication Sig Dispense Refill  . benztropine (COGENTIN) 1 MG tablet Take 1 mg by mouth 2 (two) times daily.    . clozapine (CLOZARIL) 200 MG tablet Take 250-600 mg by mouth 2 (two) times daily. Takes 230m in the morning and takes 600 MG in the evening    . docusate sodium (COLACE) 100 MG capsule Take 200 mg by mouth daily as needed for mild constipation.     . folic acid (FOLVITE) 1 MG tablet Take 1 mg by mouth daily.    .Marland KitchenLORazepam (ATIVAN) 2 MG tablet Take 2 mg by mouth every 6 (six) hours as needed for anxiety.    . metFORMIN (GLUCOPHAGE-XR) 750 MG 24 hr tablet Take 750 mg by mouth daily with breakfast.    . Multiple Vitamin (MULTIVITAMIN WITH MINERALS) TABS tablet Take 1 tablet by mouth daily.    .Marland Kitchen  senna (SENOKOT) 8.6 MG TABS tablet Take 1 tablet by mouth 2 (two) times daily.    . valproic acid (DEPAKENE) 250 MG capsule Take 1,250 mg by mouth 2 (two) times daily.      Musculoskeletal: Strength & Muscle Tone: within normal limits Gait & Station: normal Patient leans: N/A  Psychiatric Specialty Exam: Review of Systems  Constitutional: Negative.   HENT: Negative.   Eyes: Negative.   Respiratory: Negative.   Cardiovascular: Negative.   Gastrointestinal: Negative.   Genitourinary: Negative.   Musculoskeletal: Negative.   Skin: Negative.   Neurological: Negative.   Endo/Heme/Allergies: Negative.   Psychiatric/Behavioral: Negative.      Blood pressure 114/64, pulse 80, temperature 97.9 F (36.6 C), temperature source Oral, resp. rate 20, SpO2 97 %.There is no weight on file to calculate BMI.  General Appearance: Casual  Eye Contact::  Good  Speech:  Normal Rate  Volume:  Normal  Mood:  Euthymic  Affect:  Congruent  Thought Process:  Coherent  Orientation:  Full (Time, Place, and Person)  Thought Content:  WDL  Suicidal Thoughts:  No  Homicidal Thoughts:  No  Memory:  Immediate;   Good Recent;   Good Remote;   Good  Judgement:  Fair  Insight:  Fair  Psychomotor Activity:  Normal  Concentration:  Fair  Recall:  Fair  Fund of Knowledge:Fair  Language: Good  Akathisia:  No  Handed:  Right  AIMS (if indicated):     Assets:  Housing Leisure Time Physical Health Resilience Social Support  ADL's:  Intact  Cognition: WNL  Sleep:      Treatment Plan Summary: Daily contact with patient to assess and evaluate symptoms and progress in treatment, Medication management and Plan adjustment disorder with mixed disturbance of emotion and conduct:  -Crisis stabilization -Medication management: Continue Cogentin 1 mg BID for EPS, Clozaril 250 mg in am and 600 mg for schizoaffective disorder, Valporic Acid 1250 mg BID for mood stabilization -Individual counseling  Disposition: No evidence of imminent risk to self or others at present.    LORD, JAMISON, NP 09/12/2015 11:06 AM Patient seen face-to-face for psychiatric evaluation, chart reviewed and case discussed with the physician extender and developed treatment plan. Reviewed the information documented and agree with the treatment plan.  , MD 

## 2015-09-12 NOTE — ED Notes (Addendum)
Patient noted in room. No complaints, stable, in no acute distress. 1:1 sitter at bedside.  

## 2015-09-12 NOTE — [facility] Suicide Risk Assessment (Signed)
Suicide Risk Assessment  Discharge Assessment   [facility]   Principal Problem: Adjustment disorder with mixed disturbance of emotions and conduct Discharge Diagnoses:  Patient Active Problem List   Diagnosis Date Noted  . Adjustment disorder with mixed disturbance of emotions and conduct [F43.25] 07/08/2015    Priority: High  . Schizophrenia, schizoaffective, chronic with acute exacerbation (HCC) [F25.8] 06/06/2014  . Auditory hallucination [R44.0]   . Visual hallucination [R44.1]     Total Time spent with patient: 45 minutes  Musculoskeletal: Strength & Muscle Tone: within normal limits Gait & Station: normal Patient leans: N/A  Psychiatric Specialty Exam: Review of Systems  Constitutional: Negative.   HENT: Negative.   Eyes: Negative.   Respiratory: Negative.   Cardiovascular: Negative.   Gastrointestinal: Negative.   Genitourinary: Negative.   Musculoskeletal: Negative.   Skin: Negative.   Neurological: Negative.   Endo/Heme/Allergies: Negative.   Psychiatric/Behavioral: Negative.     Blood pressure 114/64, pulse 80, temperature 97.9 F (36.6 C), temperature source Oral, resp. rate 20, SpO2 97 %.There is no weight on file to calculate BMI.  General Appearance: Casual  Eye Contact::  Good  Speech:  Normal Rate  Volume:  Normal  Mood:  Euthymic  Affect:  Congruent  Thought Process:  Coherent  Orientation:  Full (Time, Place, and Person)  Thought Content:  WDL  Suicidal Thoughts:  No  Homicidal Thoughts:  No  Memory:  Immediate;   Good Recent;   Good Remote;   Good  Judgement:  Fair  Insight:  Fair  Psychomotor Activity:  Normal  Concentration:  Fair  Recall:  Fair  Fund of Knowledge:Fair  Language: Good  Akathisia:  No  Handed:  Right  AIMS (if indicated):     Assets:  Housing Leisure Time Physical Health Resilience Social Support  ADL's:  Intact  Cognition: WNL  Sleep:      Mental Status Per Nursing Assessment::    On Admission:   suicidal ideations  Demographic Factors:  NA  Loss Factors: NA  Historical Factors: Impulsivity  Risk Reduction Factors:   Sense of responsibility to family, Living with another person, especially a relative, Positive social support and Positive therapeutic relationship  Continued Clinical Symptoms:  None   Cognitive Features That Contribute To Risk:  None    Suicide Risk:  Minimal: No identifiable suicidal ideation.  Patients presenting with no risk factors but with morbid ruminations; may be classified as minimal risk based on the severity of the depressive symptoms    Plan Of Care/Follow-up recommendations:  Activity:  as tolerated Diet:  heart healthy diet  LORD, JAMISON, NP 09/12/2015, 4:58 PM

## 2015-12-04 ENCOUNTER — Emergency Department (HOSPITAL_COMMUNITY)
Admission: EM | Admit: 2015-12-04 | Discharge: 2015-12-04 | Disposition: A | Discharge status: Home / Self Care | Payer: Medicaid Other | Attending: Emergency Medicine | Admitting: Emergency Medicine

## 2015-12-04 ENCOUNTER — Encounter (HOSPITAL_COMMUNITY): Payer: Self-pay | Admitting: Emergency Medicine

## 2015-12-04 DIAGNOSIS — F209 Schizophrenia, unspecified: Secondary | ICD-10-CM | POA: Diagnosis not present

## 2015-12-04 DIAGNOSIS — R45851 Suicidal ideations: Secondary | ICD-10-CM | POA: Diagnosis present

## 2015-12-04 DIAGNOSIS — E119 Type 2 diabetes mellitus without complications: Secondary | ICD-10-CM | POA: Insufficient documentation

## 2015-12-04 DIAGNOSIS — Z79899 Other long term (current) drug therapy: Secondary | ICD-10-CM | POA: Diagnosis not present

## 2015-12-04 DIAGNOSIS — D649 Anemia, unspecified: Secondary | ICD-10-CM

## 2015-12-04 DIAGNOSIS — F32A Depression, unspecified: Secondary | ICD-10-CM

## 2015-12-04 DIAGNOSIS — D638 Anemia in other chronic diseases classified elsewhere: Secondary | ICD-10-CM | POA: Insufficient documentation

## 2015-12-04 DIAGNOSIS — Z87891 Personal history of nicotine dependence: Secondary | ICD-10-CM | POA: Diagnosis not present

## 2015-12-04 DIAGNOSIS — F329 Major depressive disorder, single episode, unspecified: Secondary | ICD-10-CM

## 2015-12-04 LAB — RAPID URINE DRUG SCREEN, HOSP PERFORMED
Amphetamines: NOT DETECTED
BARBITURATES: NOT DETECTED
Benzodiazepines: NOT DETECTED
COCAINE: NOT DETECTED
Opiates: NOT DETECTED
Tetrahydrocannabinol: NOT DETECTED

## 2015-12-04 LAB — DIFFERENTIAL
BASOS ABS: 0 10*3/uL (ref 0.0–0.1)
BASOS PCT: 0 %
EOS ABS: 0.1 10*3/uL (ref 0.0–0.7)
Eosinophils Relative: 3 %
Lymphocytes Relative: 42 %
Lymphs Abs: 1.5 10*3/uL (ref 0.7–4.0)
Monocytes Absolute: 0.2 10*3/uL (ref 0.1–1.0)
Monocytes Relative: 6 %
NEUTROS ABS: 1.8 10*3/uL (ref 1.7–7.7)
NEUTROS PCT: 49 %

## 2015-12-04 LAB — ACETAMINOPHEN LEVEL

## 2015-12-04 LAB — CBC
HEMATOCRIT: 36.7 % (ref 36.0–46.0)
Hemoglobin: 11.9 g/dL — ABNORMAL LOW (ref 12.0–15.0)
MCH: 29.5 pg (ref 26.0–34.0)
MCHC: 32.4 g/dL (ref 30.0–36.0)
MCV: 90.8 fL (ref 78.0–100.0)
Platelets: 267 10*3/uL (ref 150–400)
RBC: 4.04 MIL/uL (ref 3.87–5.11)
RDW: 12.7 % (ref 11.5–15.5)
WBC: 3.7 10*3/uL — ABNORMAL LOW (ref 4.0–10.5)

## 2015-12-04 LAB — SALICYLATE LEVEL: Salicylate Lvl: <4 mg/dL (ref 2.8–30.0)

## 2015-12-04 LAB — COMPREHENSIVE METABOLIC PANEL
ALBUMIN: 4 g/dL (ref 3.5–5.0)
ALK PHOS: 74 U/L (ref 38–126)
ALT: 11 U/L — ABNORMAL LOW (ref 14–54)
ANION GAP: 7 (ref 5–15)
AST: 16 U/L (ref 15–41)
BILIRUBIN TOTAL: 0.5 mg/dL (ref 0.3–1.2)
BUN: 12 mg/dL (ref 6–20)
CALCIUM: 9.1 mg/dL (ref 8.9–10.3)
CO2: 23 mmol/L (ref 22–32)
Chloride: 109 mmol/L (ref 101–111)
Creatinine, Ser: 0.63 mg/dL (ref 0.44–1.00)
GFR calc non Af Amer: >60 mL/min (ref >60)
GLUCOSE: 115 mg/dL — AB (ref 65–99)
Potassium: 3.6 mmol/L (ref 3.5–5.1)
Sodium: 139 mmol/L (ref 135–145)
TOTAL PROTEIN: 7.4 g/dL (ref 6.5–8.1)

## 2015-12-04 LAB — I-STAT BETA HCG BLOOD, ED (MC, WL, AP ONLY)

## 2015-12-04 LAB — ETHANOL: Alcohol, Ethyl (B): <5 mg/dL (ref <5)

## 2015-12-04 LAB — HCG, QUANTITATIVE, PREGNANCY: hCG, Beta Chain, Quant, S: <1 m[IU]/mL (ref <5)

## 2015-12-04 MED ORDER — CLOZAPINE 25 MG PO TABS: 50.0000 mg | ORAL_TABLET | Freq: Two times a day (BID) | ORAL | Status: DC

## 2015-12-04 MED ORDER — LORAZEPAM 1 MG PO TABS: 1.0000 mg | ORAL_TABLET | Freq: Three times a day (TID) | ORAL | Status: DC | PRN

## 2015-12-04 MED ORDER — CLONIDINE HCL 0.1 MG PO TABS
0.1000 mg | ORAL_TABLET | Freq: Two times a day (BID) | ORAL | Status: DC
  Filled 2015-12-04: qty 1

## 2015-12-04 MED ORDER — CLOZAPINE 25 MG PO TABS: 50.0000 mg | ORAL_TABLET | Freq: Every day | ORAL | Status: DC

## 2015-12-04 MED ORDER — ONDANSETRON HCL 4 MG PO TABS: 4.0000 mg | ORAL_TABLET | Freq: Three times a day (TID) | ORAL | Status: DC | PRN

## 2015-12-04 MED ORDER — ZOLPIDEM TARTRATE 5 MG PO TABS: 5.0000 mg | ORAL_TABLET | Freq: Every evening | ORAL | Status: DC | PRN

## 2015-12-04 MED ORDER — IBUPROFEN 200 MG PO TABS: 600.0000 mg | ORAL_TABLET | Freq: Three times a day (TID) | ORAL | Status: DC | PRN

## 2015-12-04 MED ORDER — ACETAMINOPHEN 325 MG PO TABS: 650.0000 mg | ORAL_TABLET | ORAL | Status: DC | PRN

## 2015-12-04 MED ORDER — CLOZAPINE 100 MG PO TABS
100.0000 mg | ORAL_TABLET | Freq: Every day | ORAL | Status: DC
  Filled 2015-12-04: qty 1

## 2015-12-04 MED ORDER — BENZTROPINE MESYLATE 1 MG PO TABS
1.0000 mg | ORAL_TABLET | Freq: Two times a day (BID) | ORAL | Status: DC
  Administered 2015-12-04: 1 mg via ORAL
  Filled 2015-12-04: qty 1

## 2015-12-04 MED ORDER — HALOPERIDOL 5 MG PO TABS: 5.0000 mg | ORAL_TABLET | Freq: Three times a day (TID) | ORAL | Status: DC

## 2015-12-04 MED ORDER — GABAPENTIN 300 MG PO CAPS: 300.0000 mg | ORAL_CAPSULE | Freq: Three times a day (TID) | ORAL | Status: DC

## 2015-12-04 MED ORDER — ALUM & MAG HYDROXIDE-SIMETH 200-200-20 MG/5ML PO SUSP: 30.0000 mL | ORAL | Status: DC | PRN

## 2015-12-04 NOTE — ED Notes (Signed)
Group Home called and stated that they would send transportation for patient. No specific time given when asked.
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
## 2015-12-04 NOTE — ED Notes (Signed)
Brittany Velez from the Group home wanted patient to be brought to the [place]. Was able to catch group home person and give discharge papers. No signature pad in the lobby. Emphasized that the patient needed follow up with the ACT Team , Group Home person voiced understanding.

## 2015-12-04 NOTE — ED Provider Notes (Signed)
[facility]-EMERGENCY DEPT Provider Note   CSN: [id number] Arrival date & time: 12/04/15  1206  First Provider Contact:  None       History   Chief Complaint Chief Complaint  Patient presents with  . Suicidal    HPI Brittany Velez is a 30 y.o. female with a PMHx of DM2, mental retardation, and schizoaffective schizophrenia, who presents to the ED with complaints of suicidal ideations without a plan. States this has been going on for quite some time, but cannot tell me exactly how long. Denies HI/AVH, denies illicit drug use or alcohol use, denies being a smoker. She initially reported that she is not on any medications, but then endorses that she is still taking all the medicines listed in her MAR, except Clonidine which she can't remember if she's taking or not. She denies any other medical complaints at this time, despite telling the nursing staff that she was complaining of neck and arm pain, she denies this to me. She is here voluntarily.   The history is provided by the patient. No language interpreter was used.    Past Medical History:  Diagnosis Date  . Diabetes mellitus without complication (HCC)   . Retardation   . Schizo-affective schizophrenia [facility])     Patient Active Problem List   Diagnosis Date Noted  . 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 Schizophrenia, schizoaffective, chronic with acute exacerbation (HCC) 06/06/2014  . Auditory hallucination   . Visual hallucination     History reviewed. No pertinent surgical history.  OB History    No data available       Home Medications    Prior to Admission medications   Medication Sig Start Date End Date Taking? Authorizing Provider  benztropine (COGENTIN) 1 MG tablet Take 1 mg by mouth 2 (two) times daily.   Yes Historical Provider, MD  cloNIDine (CATAPRES) 0.1 MG tablet Take 0.1 mg by mouth 2 (two) times daily.   Yes Historical Provider, MD  clozapine (CLOZARIL) 50 MG  tablet Take 50-100 mg by mouth 2 (two) times daily. 50 mg qam and 100 mg qhs   Yes Historical Provider, MD  gabapentin (NEURONTIN) 300 MG capsule Take 300 mg by mouth 3 (three) times daily.   Yes Historical Provider, MD  haloperidol (HALDOL) 5 MG tablet Take 5 mg by mouth 3 (three) times daily.   Yes Historical Provider, MD  haloperidol decanoate (HALDOL DECANOATE) 50 MG/ML injection Inject 150 mg into the muscle every 28 (twenty-eight) days.    Yes Historical Provider, MD    Family History No family history on file.  Social History Social History  Substance Use Topics  . Smoking status: Former Smoker    Types: Cigarettes  . Smokeless tobacco: Never Used  . Alcohol use No     Allergies   Review of patient's allergies indicates no known allergies.   Review of Systems Review of Systems  Constitutional: Negative for chills and fever.  Respiratory: Negative for shortness of breath.   Cardiovascular: Negative for chest pain.  Gastrointestinal: Negative for abdominal pain, constipation, diarrhea, nausea and vomiting.  Genitourinary: Negative for dysuria and hematuria.  Musculoskeletal: Negative for arthralgias and myalgias.  Skin: Negative for color change.  Allergic/Immunologic: Positive for immunocompromised state (diabetic).  Neurological: Negative for weakness and numbness.  Psychiatric/Behavioral: Positive for suicidal ideas. Negative for confusion and hallucinations.   10 Systems reviewed and are negative for acute change except as noted in the HPI.  Physical Exam Updated Vital Signs BP 118/78 (BP Location: Right Arm)   Pulse 110   Temp 98.4 F (36.9 C) (Oral)   Resp 16   Ht 5\' 2"  (1.575 m)   Wt 77.1 kg   LMP  (LMP Unknown)   SpO2 98%   BMI 31.09 kg/m   Physical Exam  Constitutional: She is oriented to person, place, and time. Vital signs are normal. She appears well-developed and well-nourished.  Non-toxic appearance. No distress.  Afebrile, nontoxic, NAD    HENT:  Head: Normocephalic and atraumatic.  Mouth/Throat: Oropharynx is clear and moist and mucous membranes are normal.  Eyes: Conjunctivae and EOM are normal. Right eye exhibits no discharge. Left eye exhibits no discharge.  Neck: Normal range of motion. Neck supple.  Cardiovascular: Normal rate, regular rhythm, normal heart sounds and intact distal pulses.  Exam reveals no gallop and no friction rub.   No murmur heard. Triage VS state tachycardia but this resolved on my exam  Pulmonary/Chest: Effort normal and breath sounds normal. No respiratory distress. She has no decreased breath sounds. She has no wheezes. She has no rhonchi. She has no rales.  Abdominal: Soft. Normal appearance and bowel sounds are normal. She exhibits no distension. There is no tenderness. There is no rigidity, no rebound, no guarding, no CVA tenderness, no tenderness at McBurney's point and negative Murphy's sign.  Musculoskeletal: Normal range of motion.  Neurological: She is alert and oriented to person, place, and time. She has normal strength. No sensory deficit.  Skin: Skin is warm, dry and intact. No rash noted.  Psychiatric: Her affect is blunt. She is not actively hallucinating. She exhibits a depressed mood. She expresses suicidal ideation. She expresses no homicidal ideation. She expresses no suicidal plans and no homicidal plans.  Flat depressed affect, endorsing SI without a plan, denies HI/AVH  Nursing note and vitals reviewed.    ED Treatments / Results  Labs (all labs ordered are listed, but only abnormal results are displayed) Labs Reviewed  COMPREHENSIVE METABOLIC PANEL - Abnormal; Notable for the following:       Result Value   Glucose, Bld 115 (*)    ALT 11 (*)    All other components within normal limits  ACETAMINOPHEN LEVEL - Abnormal; Notable for the following:    Acetaminophen (Tylenol), Serum <10 (*)    All other components within normal limits  CBC - Abnormal; Notable for the  following:    WBC 3.7 (*)    Hemoglobin 11.9 (*)    All other components within normal limits  ETHANOL  SALICYLATE LEVEL  HCG, QUANTITATIVE, PREGNANCY  DIFFERENTIAL  URINE RAPID DRUG SCREEN, HOSP PERFORMED  CBC WITH DIFFERENTIAL/PLATELET  I-STAT BETA HCG BLOOD, ED ([facility], [facility], [facility] ONLY)    EKG  EKG Interpretation None       Radiology No results found.  Procedures Procedures (including critical care time)  Medications Ordered in ED Medications  benztropine (COGENTIN) tablet 1 mg (not administered)  cloNIDine (CATAPRES) tablet 0.1 mg (not administered)  gabapentin (NEURONTIN) capsule 300 mg (not administered)  haloperidol (HALDOL) tablet 5 mg (not administered)  alum & mag hydroxide-simeth (MAALOX/MYLANTA) 200-200-20 MG/5ML suspension 30 mL (not administered)  ondansetron (ZOFRAN) tablet 4 mg (not administered)  zolpidem (AMBIEN) tablet 5 mg (not administered)  ibuprofen (ADVIL,MOTRIN) tablet 600 mg (not administered)  acetaminophen (TYLENOL) tablet 650 mg (not administered)  LORazepam (ATIVAN) tablet 1 mg (not administered)  cloZAPine (CLOZARIL) tablet 50 mg (not administered)  cloZAPine (CLOZARIL) tablet  100 mg (not administered)     Initial Impression / Assessment and Plan / ED Course  I have reviewed the triage vital signs and the nursing notes.  Pertinent labs & imaging results that were available during my care of the patient were reviewed by me and considered in my medical decision making (see chart for details).  Clinical Course    30 y.o. female here with SI without a plan, no HI/AVH, no drug/EtOH use, nonsmoker, denies other medical complaints to me despite what the nursing note states. Calm and cooperative for my exam. Labs unremarkable, UDS not yet done but doesn't hold up her med clearance. Will get preg test prior to moving her to the [facility] side, but home meds and psych hold orders placed. TTS consulted. Please see their notes for further documentation of  care/dispo. Pt here voluntarily, if she tries to leave she'll need IVC paperwork.   2:41 PM preg test neg. Medically cleared, please see TTS notes for further documentation of care. Pt stable at this time, holding orders in place.   Final Clinical Impressions(s) / ED Diagnoses   Final diagnoses:  Suicidal ideation  Chronic anemia    New Prescriptions New Prescriptions   No medications on file     Allen Derry, PA-C 12/04/15 1441    Azalia Bilis, MD 12/04/15 1622

## 2015-12-04 NOTE — ED Notes (Signed)
Gordan Payment w/ Group Home: [phone]

## 2015-12-04 NOTE — Progress Notes (Signed)
Disposition:   This Clinical research associate consulted with Drenda Freeze, NP it is recommended to discharge home to follow up with ACTT.  This information was communicated to Velna Hatchet, RN the updated disposition.  This Clinical research associate spoke with the EDP to communicate the updated disposition.    This Clinical research associate contacted the Group Home owner Gordan Payment [phone] to communicate updated disposition.    Maryelizabeth Rowan, MSW, [facility] Triage Specialist [phone]

## 2016-01-02 ENCOUNTER — Encounter (HOSPITAL_COMMUNITY): Payer: Self-pay | Admitting: Emergency Medicine

## 2016-01-02 ENCOUNTER — Emergency Department (HOSPITAL_COMMUNITY)
Admission: EM | Admit: 2016-01-02 | Discharge: 2016-01-02 | Disposition: A | Discharge status: Home / Self Care | Payer: Medicaid Other | Attending: Emergency Medicine | Admitting: Emergency Medicine

## 2016-01-02 DIAGNOSIS — Z79899 Other long term (current) drug therapy: Secondary | ICD-10-CM | POA: Insufficient documentation

## 2016-01-02 DIAGNOSIS — R45851 Suicidal ideations: Secondary | ICD-10-CM | POA: Diagnosis not present

## 2016-01-02 DIAGNOSIS — Z79891 Long term (current) use of opiate analgesic: Secondary | ICD-10-CM | POA: Diagnosis not present

## 2016-01-02 DIAGNOSIS — Z87891 Personal history of nicotine dependence: Secondary | ICD-10-CM | POA: Insufficient documentation

## 2016-01-02 DIAGNOSIS — E119 Type 2 diabetes mellitus without complications: Secondary | ICD-10-CM | POA: Insufficient documentation

## 2016-01-02 LAB — CBC
HCT: 34.5 % — ABNORMAL LOW (ref 36.0–46.0)
HEMOGLOBIN: 11.2 g/dL — AB (ref 12.0–15.0)
MCH: 29.2 pg (ref 26.0–34.0)
MCHC: 32.5 g/dL (ref 30.0–36.0)
MCV: 90.1 fL (ref 78.0–100.0)
Platelets: 348 10*3/uL (ref 150–400)
RBC: 3.83 MIL/uL — ABNORMAL LOW (ref 3.87–5.11)
RDW: 12.9 % (ref 11.5–15.5)
WBC: 5.7 10*3/uL (ref 4.0–10.5)

## 2016-01-02 LAB — COMPREHENSIVE METABOLIC PANEL
ALK PHOS: 73 U/L (ref 38–126)
ALT: 12 U/L — AB (ref 14–54)
ANION GAP: 7 (ref 5–15)
AST: 14 U/L — ABNORMAL LOW (ref 15–41)
Albumin: 4.1 g/dL (ref 3.5–5.0)
BILIRUBIN TOTAL: 0.3 mg/dL (ref 0.3–1.2)
BUN: 12 mg/dL (ref 6–20)
CALCIUM: 9.5 mg/dL (ref 8.9–10.3)
CO2: 22 mmol/L (ref 22–32)
CREATININE: 0.6 mg/dL (ref 0.44–1.00)
Chloride: 111 mmol/L (ref 101–111)
Glucose, Bld: 123 mg/dL — ABNORMAL HIGH (ref 65–99)
Potassium: 3.4 mmol/L — ABNORMAL LOW (ref 3.5–5.1)
SODIUM: 140 mmol/L (ref 135–145)
TOTAL PROTEIN: 7.5 g/dL (ref 6.5–8.1)

## 2016-01-02 LAB — RAPID URINE DRUG SCREEN, HOSP PERFORMED
AMPHETAMINES: NOT DETECTED
BARBITURATES: NOT DETECTED
Benzodiazepines: NOT DETECTED
Cocaine: NOT DETECTED
Opiates: NOT DETECTED
TETRAHYDROCANNABINOL: NOT DETECTED

## 2016-01-02 LAB — PREGNANCY, URINE: Preg Test, Ur: NEGATIVE

## 2016-01-02 LAB — ETHANOL

## 2016-01-02 LAB — ACETAMINOPHEN LEVEL

## 2016-01-02 LAB — SALICYLATE LEVEL
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
## 2016-01-02 NOTE — ED Notes (Signed)
Bed: WTR6 Expected date:  Expected time:  Means of arrival:  Comments: EMS 30yo F SI
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
## 2016-01-02 NOTE — ED Notes (Signed)
Pt currently denies any SI/HI at this time. Group home advisor at bedside with pt.

## 2016-01-02 NOTE — BH Assessment (Signed)
Spoke with group home staff Con [facility]oslex Young who states that the patient became upset because she could not get food when she wanted to. He states that she does this "all the time." Group Home staff states that patient is on "about six or seven medications" and states that she takes them as prescribed. Patients group home staff states that he feels safe taking the patient home and states "this happens all the time when she does not get what she wants." He states that the patient has an ACTT with [facility] and was last seen three weeks ago. He states that he feels comfortable following up with the ACT Team and states that patient does not have access to anything to harm herself.  Davina PokeJoVea Delayni Streed, LCSW Therapeutic Triage Specialist [facility] 01/02/2016 3:45 AM

## 2016-01-02 NOTE — BH Assessment (Signed)
Assessment completed. Consulted with [name], FNP who recommends patient be discharged to follow up with her ACT Team.   [name], LCSW Therapeutic Triage Specialist [facility] 01/02/2016 3:46 AM

## 2016-01-02 NOTE — BH Assessment (Addendum)
Tele Assessment Note   Brittany FellerLinda Velez is an 30 y.o. female presenting to [facility] voluntarily after running away from the group home. Patient states that she became upset because she wanted more food and could not have more food at the time and she ran away. When asked if she was suicidal patient states "a little bit." Patient was unable to clarify what that meant beyond stating "just suicidal thoughts sometimes." Patient denies plan or intent to harm herself. Patient denies history of attempts or self injurious behaviors. Patient denies HI and history of violence. Patient denies pending charges and upcoming court dates. Patient denies access to firearms or weapons. Patient denies AVH and does not appear to be responding to internal stimuli at the times of the assessment although she has a history of schizoaffective disorder. Patient denies use of drugs or alcohol . Patient UDS  And BAL are both clear at time of assessment.   Spoke with group home staff, Con MemosAlex Young, who states that patient becomes upset when she does npt get what she wants. He states that he feels that the patient is safe to return to the group home and does not have access to anything to harm herself. He states that he will follow up with the patients ACTT upon discharge. He states that patient last saw her [facility] Team with [facility] 2-3 weeks ago. He states that patient takes all of her medications as prescribed.  Consulted with [name], FNP who recommends patient be discharged to the care of the group home to follow up with the ACT Team.   Diagnosis: Adjustment Disorder with Disturbance of Emotions and Conduct   Past Medical History:  Past Medical History:  Diagnosis Date  . Diabetes mellitus without complication (HCC)   . Retardation   . Schizo-affective schizophrenia (HCC)     History reviewed. No pertinent surgical history.  Family History: History reviewed. No pertinent family history.  Social History:  reports that she  has quit smoking. Her smoking use included Cigarettes. She has never used smokeless tobacco. She reports that she does not drink alcohol or use drugs.  Additional Social History:  Alcohol / Drug Use Pain Medications: Denies Prescriptions: Denies Over the Counter: Denies History of alcohol / drug use?: No history of alcohol / drug abuse  CIWA: CIWA-Ar BP: 126/74 Pulse Rate: 78 COWS:    PATIENT STRENGTHS: (choose at least two) Communication skills  Allergies: No Known Allergies  Home Medications:  (Not in a hospital admission)  OB/GYN Status:  No LMP recorded (lmp unknown).  General Assessment Data TTS Assessment: In system Is this a Tele or Face-to-Face Assessment?: Tele Assessment Is this an Initial Assessment or a Re-assessment for this encounter?: Initial Assessment Marital status: Single Maiden name: Antony MaduraRutledge Is patient pregnant?: No Pregnancy Status: No Living Arrangements: Group Home ([facility]) Can pt return to current living arrangement?: Yes Admission Status: Voluntary Is patient capable of signing voluntary admission?: No Referral Source: Self/Family/Friend     Crisis Care Plan Living Arrangements: Group Home ([facility]) Legal Guardian: Other: Name of Psychiatrist: [facility] Name of Therapist: [facility]  Education Status Is patient currently in school?: No Highest grade of school patient has completed: 12  Risk to self with the past 6 months Suicidal Ideation: No Has patient been a risk to self within the past 6 months prior to admission? : No Suicidal Intent: No Has patient had any suicidal intent within the past 6 months prior to admission? : No Is patient at risk  for suicide?: No Suicidal Plan?: No Has patient had any suicidal plan within the past 6 months prior to admission? : No Access to Means: No What has been your use of drugs/alcohol within the last 12 months?: Denies Previous Attempts/Gestures: No How many times?:  0 Other Self Harm Risks: Denies Triggers for Past Attempts: None known Intentional Self Injurious Behavior: None Family Suicide History: No Recent stressful life event(s):  (Denies) Persecutory voices/beliefs?: No Depression: No (denies) Substance abuse history and/or treatment for substance abuse?: No Suicide prevention information given to non-admitted patients: Not applicable  Risk to Others within the past 6 months Homicidal Ideation: No Does patient have any lifetime risk of violence toward others beyond the six months prior to admission? : No Thoughts of Harm to Others: No Current Homicidal Intent: No Current Homicidal Plan: No Access to Homicidal Means: No Identified Victim: Denies History of harm to others?: No Assessment of Violence: None Noted Violent Behavior Description: Denies Does patient have access to weapons?: No Criminal Charges Pending?: No Does patient have a court date: No Is patient on probation?: No  Psychosis Hallucinations:  (Denies) Delusions:  (Denies)  Mental Status Report Appearance/Hygiene: Other (Comment) Eye Contact: Good Motor Activity: Unremarkable Speech: Slow Level of Consciousness: Alert Mood:  (Flat) Affect: Flat Anxiety Level: None Orientation: Person, Place, Time, Situation, Appropriate for developmental age Obsessive Compulsive Thoughts/Behaviors: None  Cognitive Functioning Concentration: Normal Memory: Recent Intact, Remote Intact Insight: Poor Impulse Control: Poor Appetite: Good Sleep: No Change Vegetative Symptoms: None  ADLScreening [facility] Services) Patient's cognitive ability adequate to safely complete daily activities?: Yes Patient able to express need for assistance with ADLs?: Yes Independently performs ADLs?: Yes (appropriate for developmental age)  Prior Inpatient Therapy Prior Inpatient Therapy: Yes Prior Therapy Dates: [facility] Prior Therapy Facilty/Provider(s): [facility] Reason for Treatment:  SI  Prior Outpatient Therapy Prior Outpatient Therapy: Yes Prior Therapy Dates: Present Prior Therapy Facilty/Provider(s): [facility] Does patient have an ACCT team?: Yes Does patient have Intensive In-House Services?  : No Does patient have [facility] services? : No Does patient have P4CC services?: No  ADL Screening (condition at time of admission) Patient's cognitive ability adequate to safely complete daily activities?: Yes Is the patient deaf or have difficulty hearing?: No Does the patient have difficulty seeing, even when wearing glasses/contacts?: No Does the patient have difficulty concentrating, remembering, or making decisions?: Yes Patient able to express need for assistance with ADLs?: Yes Does the patient have difficulty dressing or bathing?: Yes Independently performs ADLs?: Yes (appropriate for developmental age) Does the patient have difficulty walking or climbing stairs?: No Weakness of Legs: None Weakness of Arms/Hands: None  Home Assistive Devices/Equipment Home Assistive Devices/Equipment: None  Therapy Consults (therapy consults require a physician order) PT Evaluation Needed: No OT Evalulation Needed: No SLP Evaluation Needed: No Abuse/Neglect Assessment (Assessment to be complete while patient is alone) Physical Abuse: Denies Verbal Abuse: Denies Sexual Abuse: Denies Exploitation of patient/patient's resources: Denies Self-Neglect: Denies Values / Beliefs Cultural Requests During Hospitalization: None Spiritual Requests During Hospitalization: None Consults Spiritual Care Consult Needed: No Social Work Consult Needed: No Merchant navy officer (For Healthcare) Does patient have an advance directive?: No Would patient like information on creating an advanced directive?: No - patient declined information    Additional Information 1:1 In Past 12 Months?: No CIRT Risk: No Elopement Risk: No Does patient have medical clearance?: Yes      Disposition:  Disposition Initial Assessment Completed for this Encounter: Yes Disposition of Patient: Referred to  Other disposition(s): To current provider Patient referred to: Other (Comment) ([facility] ACTT)  Chrisa Hassan 01/02/2016 5:03 AM

## 2016-01-02 NOTE — ED Notes (Signed)
Pt calm and cooperative at this time. Pt to be discharged back to group home representative.

## 2016-01-09 ENCOUNTER — Emergency Department (HOSPITAL_COMMUNITY)
Admission: EM | Admit: 2016-01-09 | Discharge: 2016-01-10 | Disposition: A | Discharge status: Home / Self Care | Payer: Medicaid Other | Attending: Emergency Medicine | Admitting: Emergency Medicine

## 2016-01-09 ENCOUNTER — Encounter (HOSPITAL_COMMUNITY): Payer: Self-pay

## 2016-01-09 DIAGNOSIS — F258 Other schizoaffective disorders: Secondary | ICD-10-CM

## 2016-01-09 DIAGNOSIS — Z79899 Other long term (current) drug therapy: Secondary | ICD-10-CM | POA: Insufficient documentation

## 2016-01-09 DIAGNOSIS — F259 Schizoaffective disorder, unspecified: Secondary | ICD-10-CM

## 2016-01-09 DIAGNOSIS — F4325 Adjustment disorder with mixed disturbance of emotions and conduct: Secondary | ICD-10-CM | POA: Diagnosis not present

## 2016-01-09 DIAGNOSIS — R45851 Suicidal ideations: Secondary | ICD-10-CM | POA: Insufficient documentation

## 2016-01-09 DIAGNOSIS — Z87891 Personal history of nicotine dependence: Secondary | ICD-10-CM | POA: Insufficient documentation

## 2016-01-09 DIAGNOSIS — E119 Type 2 diabetes mellitus without complications: Secondary | ICD-10-CM | POA: Insufficient documentation

## 2016-01-09 LAB — COMPREHENSIVE METABOLIC PANEL
ALBUMIN: 4.1 g/dL (ref 3.5–5.0)
ALT: 11 U/L — AB (ref 14–54)
AST: 15 U/L (ref 15–41)
Alkaline Phosphatase: 76 U/L (ref 38–126)
Anion gap: 4 — ABNORMAL LOW (ref 5–15)
BILIRUBIN TOTAL: 0.3 mg/dL (ref 0.3–1.2)
BUN: 13 mg/dL (ref 6–20)
CHLORIDE: 111 mmol/L (ref 101–111)
CO2: 24 mmol/L (ref 22–32)
CREATININE: 0.47 mg/dL (ref 0.44–1.00)
Calcium: 9.2 mg/dL (ref 8.9–10.3)
GFR calc Af Amer: >60 mL/min (ref >60)
GLUCOSE: 90 mg/dL (ref 65–99)
Potassium: 4 mmol/L (ref 3.5–5.1)
Sodium: 139 mmol/L (ref 135–145)
TOTAL PROTEIN: 7.7 g/dL (ref 6.5–8.1)

## 2016-01-09 LAB — DIFFERENTIAL
BASOS ABS: 0 10*3/uL (ref 0.0–0.1)
BASOS PCT: 0 %
EOS ABS: 0.1 10*3/uL (ref 0.0–0.7)
Eosinophils Relative: 1 %
Lymphocytes Relative: 31 %
Lymphs Abs: 1.5 10*3/uL (ref 0.7–4.0)
Monocytes Absolute: 0.3 10*3/uL (ref 0.1–1.0)
Monocytes Relative: 6 %
NEUTROS PCT: 62 %
Neutro Abs: 3 10*3/uL (ref 1.7–7.7)

## 2016-01-09 LAB — RAPID URINE DRUG SCREEN, HOSP PERFORMED
AMPHETAMINES: NOT DETECTED
BARBITURATES: NOT DETECTED
BENZODIAZEPINES: NOT DETECTED
Cocaine: NOT DETECTED
Opiates: NOT DETECTED
Tetrahydrocannabinol: NOT DETECTED

## 2016-01-09 LAB — CBC
HEMATOCRIT: 37.3 % (ref 36.0–46.0)
Hemoglobin: 11.9 g/dL — ABNORMAL LOW (ref 12.0–15.0)
MCH: 29 pg (ref 26.0–34.0)
MCHC: 31.9 g/dL (ref 30.0–36.0)
MCV: 90.8 fL (ref 78.0–100.0)
PLATELETS: 337 10*3/uL (ref 150–400)
RBC: 4.11 MIL/uL (ref 3.87–5.11)
RDW: 12.9 % (ref 11.5–15.5)
WBC: 4.8 10*3/uL (ref 4.0–10.5)

## 2016-01-09 LAB — PREGNANCY, URINE: PREG TEST UR: NEGATIVE

## 2016-01-09 LAB — SALICYLATE LEVEL: Salicylate Lvl: <4 mg/dL (ref 2.8–30.0)

## 2016-01-09 LAB — ACETAMINOPHEN LEVEL: Acetaminophen (Tylenol), Serum: <10 ug/mL — ABNORMAL LOW (ref 10–30)

## 2016-01-09 LAB — ETHANOL

## 2016-01-09 MED ORDER — IBUPROFEN 200 MG PO TABS: 600.0000 mg | ORAL_TABLET | Freq: Three times a day (TID) | ORAL | Status: DC | PRN

## 2016-01-09 MED ORDER — GABAPENTIN 300 MG PO CAPS
300.0000 mg | ORAL_CAPSULE | Freq: Three times a day (TID) | ORAL | Status: DC
  Administered 2016-01-09 – 2016-01-10 (×4): 300 mg via ORAL
  Filled 2016-01-09 (×4): qty 1

## 2016-01-09 MED ORDER — CLOZAPINE 100 MG PO TABS
100.0000 mg | ORAL_TABLET | Freq: Every day | ORAL | Status: DC
  Administered 2016-01-09: 100 mg via ORAL
  Filled 2016-01-09: qty 1

## 2016-01-09 MED ORDER — BENZTROPINE MESYLATE 1 MG PO TABS
1.0000 mg | ORAL_TABLET | Freq: Once | ORAL | Status: AC
  Administered 2016-01-09: 1 mg via ORAL
  Filled 2016-01-09: qty 1

## 2016-01-09 MED ORDER — CLONIDINE HCL 0.1 MG PO TABS
0.1000 mg | ORAL_TABLET | Freq: Two times a day (BID) | ORAL | Status: DC
  Administered 2016-01-09 – 2016-01-10 (×3): 0.1 mg via ORAL
  Filled 2016-01-09 (×3): qty 1

## 2016-01-09 MED ORDER — CLOZAPINE 25 MG PO TABS: 50.0000 mg | ORAL_TABLET | Freq: Two times a day (BID) | ORAL | Status: DC

## 2016-01-09 MED ORDER — HALOPERIDOL 5 MG PO TABS
5.0000 mg | ORAL_TABLET | Freq: Three times a day (TID) | ORAL | Status: DC
Start: 2016-01-09 — End: 2016-01-10
  Administered 2016-01-09 – 2016-01-10 (×4): 5 mg via ORAL
  Filled 2016-01-09 (×4): qty 1

## 2016-01-09 MED ORDER — ACETAMINOPHEN 325 MG PO TABS: 650.0000 mg | ORAL_TABLET | ORAL | Status: DC | PRN

## 2016-01-09 MED ORDER — ZOLPIDEM TARTRATE 5 MG PO TABS: 5.0000 mg | ORAL_TABLET | Freq: Every evening | ORAL | Status: DC | PRN

## 2016-01-09 MED ORDER — CLOZAPINE 25 MG PO TABS
50.0000 mg | ORAL_TABLET | Freq: Every day | ORAL | Status: DC
  Administered 2016-01-09 – 2016-01-10 (×2): 50 mg via ORAL
  Filled 2016-01-09 (×2): qty 2

## 2016-01-09 MED ORDER — NICOTINE 21 MG/24HR TD PT24
21.0000 mg | MEDICATED_PATCH | Freq: Every day | TRANSDERMAL | Status: DC
  Administered 2016-01-09: 21 mg via TRANSDERMAL
  Filled 2016-01-09: qty 1

## 2016-01-09 MED ORDER — LORAZEPAM 1 MG PO TABS: 1.0000 mg | ORAL_TABLET | Freq: Three times a day (TID) | ORAL | Status: DC | PRN

## 2016-01-09 MED ORDER — ALUM & MAG HYDROXIDE-SIMETH 200-200-20 MG/5ML PO SUSP: 30.0000 mL | ORAL | Status: DC | PRN

## 2016-01-09 MED ORDER — BENZTROPINE MESYLATE 1 MG PO TABS
1.0000 mg | ORAL_TABLET | Freq: Two times a day (BID) | ORAL | Status: DC
  Administered 2016-01-09 – 2016-01-10 (×2): 1 mg via ORAL
  Filled 2016-01-09 (×2): qty 1

## 2016-01-09 MED ORDER — ONDANSETRON HCL 4 MG PO TABS: 4.0000 mg | ORAL_TABLET | Freq: Three times a day (TID) | ORAL | Status: DC | PRN

## 2016-01-09 NOTE — ED Notes (Signed)
Interview complete with TTS

## 2016-01-09 NOTE — ED Notes (Signed)
Bed: [facility] Expected date:  Expected time:  Means of arrival:  Comments: 

## 2016-01-09 NOTE — ED Notes (Signed)
Bed: [facility] Expected date:  Expected time:  Means of arrival:  Comments: 

## 2016-01-09 NOTE — ED Notes (Signed)
MD at bedside. EDP J SPEAKING WITH PT

## 2016-01-09 NOTE — ED Triage Notes (Signed)
Pt c/o SI w/ plan to "slit throat" and HI toward people at her group home x 1 month.  Pt denies having access to knives.  When asked about group home, Pt reports "I don't like it there."  Pt reports that she has been taking all medication as prescribed.

## 2016-01-09 NOTE — ED Provider Notes (Signed)
Patient reports that she wants to kill herself. Plan is to cut her wrists. She is been depressed for a few days she cannot elaborate specifically over Y she is feeling suicidal. Patient is alert Glasgow Coma Score 15 gait normal .. Cranial nerves II through XII grossly intact.   Doug SouSam Marshel Golubski, MD 01/09/16 1154

## 2016-01-09 NOTE — BH Assessment (Signed)
Assessment Note  Brittany FellerLinda Velez is an 30 y.o. female that contacted [employer] this date after leaving her group home [facility]([employer] and stated to police she was going to harm herself. Patient stated she was going to "cut her wrist" and refused to go back to her group home. Patient's case manager is present at the time of the assessment Con Memos(Alex Young [phone]) who stated patient became upset after she was asked to "clean her room." Patient has had multiple admission with [facility] for S/I. Patient states she is not going back to her group home. Patient is vague in reference to her plan for self harm but stated she probably would "cut her wrist with whatever." Patient has a history of acting out when she becomes upset with staff therefore presenting in the ED multiple time in the past 6 months.  She reports the patient is currently being follow-up by an ACTT([phone]) and was discharged from an inpatient setting in the last 30 days. Patient states she "just wants to stay over night to not be mad anymore." Patient cannot contact for safety. Case was staffed with Shaune PollackLord DNP who recommended patient be monitored and re-evaluated in the a.m.     Diagnosis: Schizo-affective Disorder, Cognitive Impairment     Past Medical History:  Past Medical History:  Diagnosis Date  . Diabetes mellitus without complication (HCC)   . Retardation   . Schizo-affective schizophrenia (HCC)     History reviewed. No pertinent surgical history.  Family History: History reviewed. No pertinent family history.  Social History:  reports that she has quit smoking. Her smoking use included Cigarettes. She has never used smokeless tobacco. She reports that she does not drink alcohol or use drugs.  Additional Social History:  Alcohol / Drug Use Pain Medications: Denies Prescriptions: Denies Over the Counter: Denies History of alcohol / drug use?: No history of alcohol / drug  abuse Longest period of sobriety (when/how long): NA  CIWA: CIWA-Ar BP: 126/79 Pulse Rate: 78 COWS:    Allergies: No Known Allergies  Home Medications:  (Not in a hospital admission)  OB/GYN Status:  No LMP recorded.  General Assessment Data [facility] of Assessment: [facility] TTS Assessment: In system Is this a Tele or Face-to-Face Assessment?: Face-to-Face Is this an Initial Assessment or a Re-assessment for this encounter?: Initial Assessment Marital status: Single Maiden name: Rutlegde Is patient pregnant?: No Pregnancy Status: No Living Arrangements: Group Home Can pt return to current living arrangement?: Yes Admission Status: Voluntary Is patient capable of signing voluntary admission?: No Referral Source: Self/Family/Friend [facility]  Medical Screening Exam [facility]) Medical Exam completed: Yes  Crisis Care Plan Living Arrangements: Group Home Legal Guardian: Other: Name of Psychiatrist: [facility] Name of Therapist: [facility]  Education Status Is patient currently in school?: No Highest grade of school patient has completed: 12  Risk to self with the past 6 months Suicidal Ideation: Yes-Currently Present Has patient been a risk to self within the past 6 months prior to admission? : No Suicidal Intent: No Has patient had any suicidal intent within the past 6 months prior to admission? : No Is patient at risk for suicide?: Yes Suicidal Plan?: Yes-Currently Present Has patient had any suicidal plan within the past 6 months prior to admission? : No Specify Current Suicidal Plan: pt states they will cut their wrist Access to Means: No What has been your use of drugs/alcohol within the last 12 months?: denies  Previous Attempts/Gestures: No How many times?: 0 Other Self Harm Risks: none Triggers for Past Attempts: Unknown Intentional Self Injurious Behavior: None Family Suicide History: No Recent stressful life  event(s): Other (Comment) (problems in group home) Persecutory voices/beliefs?: No Depression: No Substance abuse history and/or treatment for substance abuse?: No Suicide prevention information given to non-admitted patients: Not applicable  Risk to Others within the past 6 months Homicidal Ideation: No Does patient have any lifetime risk of violence toward others beyond the six months prior to admission? : No Thoughts of Harm to Others: No Current Homicidal Intent: No Current Homicidal Plan: No Access to Homicidal Means: No Identified Victim: denies History of harm to others?: No Assessment of Violence: None Noted Violent Behavior Description: denies Does patient have access to weapons?: No Criminal Charges Pending?: No Does patient have a court date: No Is patient on probation?: No  Psychosis Hallucinations: None noted Delusions: None noted  Mental Status Report Appearance/Hygiene: In scrubs Eye Contact: Fair Motor Activity: Freedom of movement Speech: Soft, Slow Level of Consciousness: Alert Mood: Anxious Affect: Anxious Anxiety Level: Minimal Thought Processes: Circumstantial Judgement: Impaired Orientation: Person, Place, Time Obsessive Compulsive Thoughts/Behaviors: None  Cognitive Functioning Concentration: Decreased Memory: Recent Intact Insight: Poor Impulse Control: Poor Appetite: Good Weight Loss: 0 Weight Gain: 0 Sleep: No Change Total Hours of Sleep: 7 Vegetative Symptoms: None  ADLScreening [facility] Assessment Services) Patient's cognitive ability adequate to safely complete daily activities?: Yes Patient able to express need for assistance with ADLs?: Yes Independently performs ADLs?: Yes (appropriate for developmental age)  Prior Inpatient Therapy Prior Inpatient Therapy: Yes Prior Therapy Dates: 2017 Prior Therapy Facilty/Provider(s): [facility] Reason for Treatment: SI  Prior Outpatient Therapy Prior Outpatient Therapy: Yes Prior Therapy Dates:  current Prior Therapy Facilty/Provider(s): [facility] Reason for Treatment: MH issues Does patient have an ACCT team?: Yes Does patient have Intensive In-House Services?  : No Does patient have [facility] services? : No Does patient have P4CC services?: No  ADL Screening (condition at time of admission) Patient's cognitive ability adequate to safely complete daily activities?: Yes Is the patient deaf or have difficulty hearing?: No Does the patient have difficulty seeing, even when wearing glasses/contacts?: No Does the patient have difficulty concentrating, remembering, or making decisions?: Yes Patient able to express need for assistance with ADLs?: Yes Does the patient have difficulty dressing or bathing?: Yes Independently performs ADLs?: Yes (appropriate for developmental age) Does the patient have difficulty walking or climbing stairs?: No Weakness of Legs: None Weakness of Arms/Hands: None  Home Assistive Devices/Equipment Home Assistive Devices/Equipment: None  Therapy Consults (therapy consults require a physician order) PT Evaluation Needed: No OT Evalulation Needed: No SLP Evaluation Needed: No Abuse/Neglect Assessment (Assessment to be complete while patient is alone) Physical Abuse: Denies Verbal Abuse: Denies Sexual Abuse: Denies Exploitation of patient/patient's resources: Denies Self-Neglect: Denies Values / Beliefs Cultural Requests During Hospitalization: None Spiritual Requests During Hospitalization: None Consults Spiritual Care Consult Needed: No Social Work Consult Needed: No Merchant navy officer (For Healthcare) Does patient have an advance directive?: No Would patient like information on creating an advanced directive?: No - patient declined information    Additional Information 1:1 In Past 12 Months?: No CIRT Risk: No Elopement Risk: No Does patient have medical clearance?: Yes     Disposition: Case was staffed with [name] who recommended  patient be monitored and re-evaluated in the a.m.     Disposition Initial Assessment Completed for this Encounter: Yes Disposition of Patient: Other dispositions Other disposition(s): Other (  Comment) Patient referred to: Other (Comment) (pt will be re-evaluated in the a.m.)  On Site Evaluation by:   Reviewed with Physician:    Alfredia Ferguson 01/09/2016 11:55 AM

## 2016-01-09 NOTE — ED Notes (Signed)
[place] [facility] REP HERE TO ASSESS PT

## 2016-01-09 NOTE — Progress Notes (Signed)
CSW spoke with Florent[facility] after pt's assessment. Katie recommended knives in group home to be locked which has been confirmed. Katie mentioned to CSW that she believes pt is at her baseline however should have monitoring due to medication change. [facility] would like to be notified once pt is ready for discharge. CSW will continue to update.   Stacy GardnerErin Gerard Bonus, LCSWA Clinical Social Worker [phone]

## 2016-01-09 NOTE — ED Provider Notes (Signed)
[facility]-EMERGENCY DEPT Provider Note   CSN: [id number] Arrival date & time: 01/09/16  [id number]     History   Chief Complaint Chief Complaint  Patient presents with  . Suicidal    HPI  Blood pressure 126/79, pulse 100, temperature 97.7 F (36.5 C), temperature source Oral, resp. rate 18, SpO2 97 %.  Brittany Velez is a 30 y.o. female presenting with suicidal ideation, states this is been going on "for a while." Has plan to cut her throat, has prior suicide attempt in including her wrists. States she's been compliant with her medication for "anger." He endorses auditory command hallucinations telling her to kill herself. No particular life stressors at this point. She denies visual hallucinations, alcohol or drug abuse.   HPI  Past Medical History:  Diagnosis Date  . Diabetes mellitus without complication (HCC)   . Retardation   . Schizo-affective schizophrenia [facility](HCC)     Patient Active Problem List   Diagnosis Date Noted  . 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 Schizophrenia, schizoaffective, chronic with acute exacerbation (HCC) 06/06/2014  . Auditory hallucination   . Visual hallucination     History reviewed. No pertinent surgical history.  OB History    No data available       Home Medications    Prior to Admission medications   Medication Sig Start Date End Date Taking? Authorizing Provider  benztropine (COGENTIN) 1 MG tablet Take 1 mg by mouth 2 (two) times daily.   Yes Historical Provider, MD  clonazePAM (KLONOPIN) 1 MG tablet Take 1 mg by mouth 3 (three) times daily as needed for anxiety.   Yes Historical Provider, MD  cloNIDine (CATAPRES) 0.1 MG tablet Take 0.1 mg by mouth 2 (two) times daily.   Yes Historical Provider, MD  clozapine (CLOZARIL) 50 MG tablet Take 50-100 mg by mouth 2 (two) times daily. 50 mg qam and 100 mg qhs   Yes Historical Provider, MD  gabapentin (NEURONTIN) 300 MG capsule Take 300 mg by mouth 3  (three) times daily.   Yes Historical Provider, MD  haloperidol (HALDOL) 5 MG tablet Take 5 mg by mouth 3 (three) times daily.   Yes Historical Provider, MD    Family History History reviewed. No pertinent family history.  Social History Social History  Substance Use Topics  . Smoking status: Former Smoker    Types: Cigarettes  . Smokeless tobacco: Never Used  . Alcohol use No     Allergies   Review of patient's allergies indicates no known allergies.   Review of Systems Review of Systems  10 systems reviewed and found to be negative, except as noted in the HPI.   Physical Exam Updated Vital Signs BP 108/79 (BP Location: Left Arm)   Pulse 109   Temp 98.2 F (36.8 C) (Oral)   Resp 16   Ht 5\' 7"  (1.702 m)   Wt 81.6 kg   SpO2 100%   BMI 28.19 kg/m   Physical Exam  Constitutional: She is oriented to person, place, and time. She appears well-developed and well-nourished. No distress.  HENT:  Head: Normocephalic and atraumatic.  Mouth/Throat: Oropharynx is clear and moist.  Eyes: Conjunctivae and EOM are normal. Pupils are equal, round, and reactive to light.  Neck: Normal range of motion.  Cardiovascular: Normal rate, regular rhythm and intact distal pulses.  Exam reveals no gallop and no friction rub.   Pulmonary/Chest: Effort normal and breath sounds normal.  Abdominal: Soft. She exhibits no  distension. There is no tenderness. There is no guarding.  Musculoskeletal: Normal range of motion.  Neurological: She is alert and oriented to person, place, and time.  Skin: She is not diaphoretic.  Psychiatric: Her speech is delayed. She expresses suicidal ideation. She expresses no homicidal ideation. She expresses suicidal plans. She expresses no homicidal plans.  Flat affect, tangential speech, patient stating she is young, she is only 30 years old (note that she is actually 83)  Nursing note and vitals reviewed.    ED Treatments / Results  Labs (all labs ordered  are listed, but only abnormal results are displayed) Labs Reviewed  COMPREHENSIVE METABOLIC PANEL - Abnormal; Notable for the following:       Result Value   ALT 11 (*)    Anion gap 4 (*)    All other components within normal limits  ACETAMINOPHEN LEVEL - Abnormal; Notable for the following:    Acetaminophen (Tylenol), Serum <10 (*)    All other components within normal limits  CBC - Abnormal; Notable for the following:    Hemoglobin 11.9 (*)    All other components within normal limits  ETHANOL  SALICYLATE LEVEL  URINE RAPID DRUG SCREEN, HOSP PERFORMED  DIFFERENTIAL  PREGNANCY, URINE    EKG  EKG Interpretation None       Radiology No results found.  Procedures Procedures (including critical care time)  Medications Ordered in ED Medications  benztropine (COGENTIN) tablet 1 mg (1 mg Oral Given 01/09/16 1131)  cloNIDine (CATAPRES) tablet 0.1 mg (0.1 mg Oral Given 01/09/16 1130)  gabapentin (NEURONTIN) capsule 300 mg (300 mg Oral Given 01/09/16 1130)  haloperidol (HALDOL) tablet 5 mg (5 mg Oral Given 01/09/16 1130)  cloZAPine (CLOZARIL) tablet 50 mg (50 mg Oral Given 01/09/16 1131)  cloZAPine (CLOZARIL) tablet 100 mg (not administered)  alum & mag hydroxide-simeth (MAALOX/MYLANTA) 200-200-20 MG/5ML suspension 30 mL (not administered)  ondansetron (ZOFRAN) tablet 4 mg (not administered)  nicotine (NICODERM CQ - dosed in mg/24 hours) patch 21 mg (21 mg Transdermal Patch Removed 01/09/16 1429)  zolpidem (AMBIEN) tablet 5 mg (not administered)  ibuprofen (ADVIL,MOTRIN) tablet 600 mg (not administered)  acetaminophen (TYLENOL) tablet 650 mg (not administered)  LORazepam (ATIVAN) tablet 1 mg (not administered)     Initial Impression / Assessment and Plan / ED Course  I have reviewed the triage vital signs and the nursing notes.  Pertinent labs & imaging results that were available during my care of the patient were reviewed by me and considered in my medical decision making  (see chart for details).  Clinical Course    Vitals:   01/09/16 0827 01/09/16 1129 01/09/16 1534 01/09/16 1611  BP: 126/79  102/59 108/79  Pulse: 100 78 115 109  Resp: 18 16 18 16   Temp: 97.7 F (36.5 C) 98.2 F (36.8 C)  98.2 F (36.8 C)  TempSrc: Oral Oral  Oral  SpO2: 97% 100% 96% 100%  Weight:  81.6 kg    Height:  5\' 7"  (1.702 m)      Medications  benztropine (COGENTIN) tablet 1 mg (1 mg Oral Given 01/09/16 1131)  cloNIDine (CATAPRES) tablet 0.1 mg (0.1 mg Oral Given 01/09/16 1130)  gabapentin (NEURONTIN) capsule 300 mg (300 mg Oral Given 01/09/16 1130)  haloperidol (HALDOL) tablet 5 mg (5 mg Oral Given 01/09/16 1130)  cloZAPine (CLOZARIL) tablet 50 mg (50 mg Oral Given 01/09/16 1131)  cloZAPine (CLOZARIL) tablet 100 mg (not administered)  alum & mag hydroxide-simeth (MAALOX/MYLANTA) 200-200-20 MG/5ML suspension 30  mL (not administered)  ondansetron (ZOFRAN) tablet 4 mg (not administered)  nicotine (NICODERM CQ - dosed in mg/24 hours) patch 21 mg (21 mg Transdermal Patch Removed 01/09/16 1429)  zolpidem (AMBIEN) tablet 5 mg (not administered)  ibuprofen (ADVIL,MOTRIN) tablet 600 mg (not administered)  acetaminophen (TYLENOL) tablet 650 mg (not administered)  LORazepam (ATIVAN) tablet 1 mg (not administered)    Brittany Velez is 30 y.o. female presenting with Suicidal ideation and plan to slit her throat. Prior suicide attempt.   Patient is medically cleared for psychiatric evaluation will be transferred to the psych ED. TTS consulted, home meds and psych standard holding orders placed.      Final Clinical Impressions(s) / ED Diagnoses   Final diagnoses:  Suicidal ideation    New Prescriptions New Prescriptions   No medications on file     Wynetta Emery, PA-C 01/09/16 1621    Doug Sou, MD 01/09/16 [phone]

## 2016-01-09 NOTE — ED Notes (Addendum)
Per psychiatry pt to stay for obs overnight.

## 2016-01-09 NOTE — Progress Notes (Addendum)
2:10pm- [facility] stating she would be coming to the ED today to assess patient.   Elenore PaddyLaVonia Miniya Miguez, LCSWA [phone] ED CSW 01/09/2016 3:13 PM

## 2016-01-09 NOTE — Progress Notes (Signed)
ED CM [facility] while awaiting ED RN to provide today interactions with pt  CM provided reason for ED admission -SI -slit throat- HI towards staff at group home Provided MAR, face sheet, EDP notes for 01/09/16, TTS assessment for 01/09/16 and 01/02/16 notes Answered questions

## 2016-01-09 NOTE — ED Notes (Signed)
Family at bedside. 

## 2016-01-09 NOTE — Progress Notes (Addendum)
[facility] and no admissions  No pcp listed but [employer] [facility] access response hx indicates pcp [facility] Dr Amada KingfisherGeorge Osei Bonsu EPIC updated  With review of chart notes for the last 2 ED visits CM noted [facility] ([address], [place], [place] [address])  staff indicating pt "does this a lot" "when she does not get what she wants"  Pt noted to run away from group home

## 2016-01-09 NOTE — ED Notes (Signed)
Pt observed wandering hallway from room #24 to room #7 in ED. Pt sat in stretcher hallway B. Encouraged to stay in stretcher assigned to her. Family present to assist. Pt is calm with care.

## 2016-01-09 NOTE — ED Notes (Signed)
Pt has been changed into scrubs.  Pt and belongings wanded by Security.  

## 2016-01-09 NOTE — BH Assessment (Signed)
[facility] Assessment Progress Note   Case was staffed with Lord DNP who recommended patient be monitored and re-evaluated in the a.m.  

## 2016-01-09 NOTE — ED Notes (Signed)
MD at bedside. TTS DAVID PRESENT

## 2016-01-09 NOTE — ED Notes (Signed)
Belongings transferred with pt and [facility] to [facility].

## 2016-01-10 DIAGNOSIS — F258 Other schizoaffective disorders: Secondary | ICD-10-CM

## 2016-01-10 DIAGNOSIS — F4325 Adjustment disorder with mixed disturbance of emotions and conduct: Secondary | ICD-10-CM

## 2016-01-10 NOTE — ED Notes (Signed)
Pt discharged reluctantly,  She did not want to go to group home.  After some coercion she walked out on her own with security present.  She was in no distress and all belongings were returned and discharge instructions were given.

## 2016-01-10 NOTE — Progress Notes (Signed)
Pt awake and walking the hall.  Pt at times standing at nurses station unresponsive, shaking with eyes looking up at ceiling.  MD assessed pt and ordered med 1 x order.  Pt has been cooperative but not very talkative.  Pt takes meds voluntarily.  Pt denes pain and discomfort.  Pt ambulated to bathroom and pulled most of her weave out.  Pt denies any pain from pulling weave out.  Pt offered support and encouragement.  Pt remains safe on unit

## 2016-01-10 NOTE — Consult Note (Signed)
[place] Psychiatry Consult   Reason for Consult:  Altercation at her group home Referring Physician:  EDP Patient Identification: Brittany Velez MRN:  [id number] Principal Diagnosis:adjustment disorder with mixed disturbance of emotions and conduct Diagnosis:   Patient Active Problem List   Diagnosis Date Noted  . Adjustment disorder with mixed disturbance of emotions and conduct [F43.25] 07/08/2015    Priority: High  . Schizophrenia, schizoaffective, chronic with acute exacerbation ([place]) [F25.8] 06/06/2014  . Auditory hallucination [R44.0]   . Visual hallucination [R44.1]     Total Time spent with patient: 45 minutes  Subjective:   Brittany Velez is a 30 y.o. female patient does not warrant admission.  HPI:  30 yo female who got upset with her group home after they asked her to clean her room.  She came to the ED and did not want to go back.  Hollie has been calm and cooperative, no suicidal/homicidal ideations, hallcuinations, and alcohol/drug abuse.  Stable to return.  Past Psychiatric History: schizophrenia  Risk to Self: Suicidal Ideation: Yes-Currently Present Suicidal Intent: No Is patient at risk for suicide?: Yes Suicidal Plan?: Yes-Currently Present Specify Current Suicidal Plan: pt states they will cut their wrist Access to Means: No What has been your use of drugs/alcohol within the last 12 months?: denies How many times?: 0 Other Self Harm Risks: none Triggers for Past Attempts: Unknown Intentional Self Injurious Behavior: None Risk to Others: Homicidal Ideation: No Thoughts of Harm to Others: No Current Homicidal Intent: No Current Homicidal Plan: No Access to Homicidal Means: No Identified Victim: denies History of harm to others?: No Assessment of Violence: None Noted Violent Behavior Description: denies Does patient have access to weapons?: No Criminal Charges Pending?: No Does patient have a court date: No Prior Inpatient Therapy: Prior  Inpatient Therapy: Yes Prior Therapy Dates: 2017 Prior Therapy Facilty/Provider(s): [facility] Reason for Treatment: SI Prior Outpatient Therapy: Prior Outpatient Therapy: Yes Prior Therapy Dates: current Prior Therapy Facilty/Provider(s): [facility] Reason for Treatment: MH issues Does patient have an ACCT team?: Yes Does patient have Intensive In-House Services?  : No Does patient have [facility] services? : No Does patient have P4CC services?: No  Past Medical History:  Past Medical History:  Diagnosis Date  . Diabetes mellitus without complication ([place])   . Retardation   . Schizo-affective schizophrenia ([place])    History reviewed. No pertinent surgical history. Family History: History reviewed. No pertinent family history. Family Psychiatric  History: none Social History:  History  Alcohol Use No     History  Drug Use No    Social History   Social History  . Marital status: Single    Spouse name: N/A  . Number of children: N/A  . Years of education: N/A   Social History Main Topics  . Smoking status: Former Smoker    Types: Cigarettes  . Smokeless tobacco: Never Used  . Alcohol use No  . Drug use: No  . Sexual activity: Yes   Other Topics Concern  . None   Social History Narrative  . None   Additional Social History:    Allergies:  No Known Allergies  Labs:  Results for orders placed or performed during the hospital encounter of 01/09/16 (from the past 48 hour(s))  Rapid urine drug screen (hospital performed)     Status: None   Collection Time: 01/09/16  8:52 AM  Result Value Ref Range   Opiates NONE DETECTED NONE DETECTED   Cocaine NONE DETECTED NONE DETECTED   Benzodiazepines  NONE DETECTED NONE DETECTED   Amphetamines NONE DETECTED NONE DETECTED   Tetrahydrocannabinol NONE DETECTED NONE DETECTED   Barbiturates NONE DETECTED NONE DETECTED    Comment:        DRUG SCREEN FOR MEDICAL PURPOSES ONLY.  IF CONFIRMATION IS NEEDED FOR ANY PURPOSE, NOTIFY  LAB WITHIN 5 DAYS.        LOWEST DETECTABLE LIMITS FOR URINE DRUG SCREEN Drug Class       Cutoff (ng/mL) Amphetamine      1000 Barbiturate      200 Benzodiazepine   469 Tricyclics       629 Opiates          300 Cocaine          300 THC              50   Pregnancy, urine     Status: None   Collection Time: 01/09/16  8:52 AM  Result Value Ref Range   Preg Test, Ur NEGATIVE NEGATIVE    Comment:        THE SENSITIVITY OF THIS METHODOLOGY IS >20 mIU/mL.   Comprehensive metabolic panel     Status: Abnormal   Collection Time: 01/09/16  9:15 AM  Result Value Ref Range   Sodium 139 135 - 145 mmol/L   Potassium 4.0 3.5 - 5.1 mmol/L   Chloride 111 101 - 111 mmol/L   CO2 24 22 - 32 mmol/L   Glucose, Bld 90 65 - 99 mg/dL   BUN 13 6 - 20 mg/dL   Creatinine, Ser 0.47 0.44 - 1.00 mg/dL   Calcium 9.2 8.9 - 10.3 mg/dL   Total Protein 7.7 6.5 - 8.1 g/dL   Albumin 4.1 3.5 - 5.0 g/dL   AST 15 15 - 41 U/L   ALT 11 (L) 14 - 54 U/L   Alkaline Phosphatase 76 38 - 126 U/L   Total Bilirubin 0.3 0.3 - 1.2 mg/dL   GFR calc non Af Amer >60 >60 mL/min   GFR calc Af Amer >60 >60 mL/min    Comment: (NOTE) The eGFR has been calculated using the CKD EPI equation. This calculation has not been validated in all clinical situations. eGFR's persistently <60 mL/min signify possible Chronic Kidney Disease.    Anion gap 4 (L) 5 - 15  Ethanol     Status: None   Collection Time: 01/09/16  9:15 AM  Result Value Ref Range   Alcohol, Ethyl (B) <5 <5 mg/dL    Comment:        LOWEST DETECTABLE LIMIT FOR SERUM ALCOHOL IS 5 mg/dL FOR MEDICAL PURPOSES ONLY   Salicylate level     Status: None   Collection Time: 01/09/16  9:15 AM  Result Value Ref Range   Salicylate Lvl <5.2 2.8 - 30.0 mg/dL  Acetaminophen level     Status: Abnormal   Collection Time: 01/09/16  9:15 AM  Result Value Ref Range   Acetaminophen (Tylenol), Serum <10 (L) 10 - 30 ug/mL    Comment:        THERAPEUTIC CONCENTRATIONS  VARY SIGNIFICANTLY. A RANGE OF 10-30 ug/mL MAY BE AN EFFECTIVE CONCENTRATION FOR MANY PATIENTS. HOWEVER, SOME ARE BEST TREATED AT CONCENTRATIONS OUTSIDE THIS RANGE. ACETAMINOPHEN CONCENTRATIONS >150 ug/mL AT 4 HOURS AFTER INGESTION AND >50 ug/mL AT 12 HOURS AFTER INGESTION ARE OFTEN ASSOCIATED WITH TOXIC REACTIONS.   cbc     Status: Abnormal   Collection Time: 01/09/16  9:15 AM  Result Value Ref Range  WBC 4.8 4.0 - 10.5 K/uL   RBC 4.11 3.87 - 5.11 MIL/uL   Hemoglobin 11.9 (L) 12.0 - 15.0 g/dL   HCT 37.3 36.0 - 46.0 %   MCV 90.8 78.0 - 100.0 fL   MCH 29.0 26.0 - 34.0 pg   MCHC 31.9 30.0 - 36.0 g/dL   RDW 12.9 11.5 - 15.5 %   Platelets 337 150 - 400 K/uL  Differential     Status: None   Collection Time: 01/09/16  9:15 AM  Result Value Ref Range   Neutrophils Relative % 62 %   Neutro Abs 3.0 1.7 - 7.7 K/uL   Lymphocytes Relative 31 %   Lymphs Abs 1.5 0.7 - 4.0 K/uL   Monocytes Relative 6 %   Monocytes Absolute 0.3 0.1 - 1.0 K/uL   Eosinophils Relative 1 %   Eosinophils Absolute 0.1 0.0 - 0.7 K/uL   Basophils Relative 0 %   Basophils Absolute 0.0 0.0 - 0.1 K/uL    Current Facility-Administered Medications  Medication Dose Route Frequency Provider Last Rate Last Dose  . acetaminophen (TYLENOL) tablet 650 mg  650 mg Oral Q4H PRN Nicole Pisciotta, PA-C      . alum & mag hydroxide-simeth (MAALOX/MYLANTA) 200-200-20 MG/5ML suspension 30 mL  30 mL Oral PRN Nicole Pisciotta, PA-C      . benztropine (COGENTIN) tablet 1 mg  1 mg Oral BID Nicole Pisciotta, PA-C   1 mg at 01/09/16 1131  . cloNIDine (CATAPRES) tablet 0.1 mg  0.1 mg Oral BID Nicole Pisciotta, PA-C   0.1 mg at 01/09/16 2143  . cloZAPine (CLOZARIL) tablet 100 mg  100 mg Oral QHS Orlie Dakin, MD   100 mg at 01/09/16 2143  . cloZAPine (CLOZARIL) tablet 50 mg  50 mg Oral Daily Orlie Dakin, MD   50 mg at 01/09/16 1131  . gabapentin (NEURONTIN) capsule 300 mg  300 mg Oral TID Monico Blitz, PA-C   300 mg at  01/09/16 2143  . haloperidol (HALDOL) tablet 5 mg  5 mg Oral TID Monico Blitz, PA-C   5 mg at 01/09/16 2143  . ibuprofen (ADVIL,MOTRIN) tablet 600 mg  600 mg Oral Q8H PRN Monico Blitz, PA-C      . LORazepam (ATIVAN) tablet 1 mg  1 mg Oral Q8H PRN Nicole Pisciotta, PA-C      . nicotine (NICODERM CQ - dosed in mg/24 hours) patch 21 mg  21 mg Transdermal Daily Nicole Pisciotta, PA-C   21 mg at 01/09/16 1327  . ondansetron (ZOFRAN) tablet 4 mg  4 mg Oral Q8H PRN Nicole Pisciotta, PA-C      . zolpidem (AMBIEN) tablet 5 mg  5 mg Oral QHS PRN Monico Blitz, PA-C       Current Outpatient Prescriptions  Medication Sig Dispense Refill  . benztropine (COGENTIN) 1 MG tablet Take 1 mg by mouth 2 (two) times daily.    . cloNIDine (CATAPRES) 0.1 MG tablet Take 0.1 mg by mouth 2 (two) times daily.    . clozapine (CLOZARIL) 50 MG tablet Take 50-100 mg by mouth 2 (two) times daily. 50 mg qam and 100 mg qhs    . gabapentin (NEURONTIN) 300 MG capsule Take 300 mg by mouth 3 (three) times daily.    . haloperidol (HALDOL) 5 MG tablet Take 7.5 mg by mouth 3 (three) times daily.     . temazepam (RESTORIL) 30 MG capsule Take 30 mg by mouth at bedtime as needed for sleep.  Musculoskeletal: Strength & Muscle Tone: within normal limits Gait & Station: normal Patient leans: N/A  Psychiatric Specialty Exam: Physical Exam  Constitutional: She is oriented to person, place, and time. She appears well-developed and well-nourished.  HENT:  Head: Normocephalic.  Neck: Normal range of motion.  Respiratory: Effort normal.  Musculoskeletal: Normal range of motion.  Neurological: She is alert and oriented to person, place, and time.  Skin: Skin is warm and dry.  Psychiatric: Her speech is normal and behavior is normal. Judgment normal. Cognition and memory are normal. She exhibits a depressed mood.    Review of Systems  Constitutional: Negative.   HENT: Negative.   Eyes: Negative.   Respiratory:  Negative.   Cardiovascular: Negative.   Gastrointestinal: Negative.   Genitourinary: Negative.   Musculoskeletal: Negative.   Skin: Negative.   Neurological: Negative.   Endo/Heme/Allergies: Negative.   Psychiatric/Behavioral: Positive for depression.    Blood pressure 122/72, pulse 71, temperature 99.1 F (37.3 C), temperature source Oral, resp. rate 16, height _0  (1.702 m), weight 81.6 kg (180 lb), SpO2 98 %.Body mass index is 28.19 kg/m.  General Appearance: Casual  Eye Contact:  Good  Speech:  Normal Rate  Volume:  Normal  Mood:  Depressed  Affect:  Congruent  Thought Process:  Coherent and Descriptions of Associations: Intact  Orientation:  Full (Time, Place, and Person)  Thought Content:  WDL  Suicidal Thoughts:  No  Homicidal Thoughts:  No  Memory:  Immediate;   Fair Recent;   Fair Remote;   Fair  Judgement:  Fair  Insight:  Fair  Psychomotor Activity:  Normal  Concentration:  Concentration: Fair and Attention Span: Fair  Recall:  [employer] of Knowledge:  Fair  Language:  Good  Akathisia:  No  Handed:  Right  AIMS (if indicated):     Assets:  Leisure Time Physical Health Resilience Social Support  ADL's:  Intact  Cognition:  WNL  Sleep:        Treatment Plan Summary: Daily contact with patient to assess and evaluate symptoms and progress in treatment, Medication management and Plan adjustment disorder with mixed disturbance of conduct and emotions:  -Crisis stabilization -Medication management:  Continued her medical mediations along with her Ativan 1 mg every 8 hours PRN anxiety, Haldol 5 mg TID for psychosis, gabapentin 300 mg TID for mood stabilization, Cogentin, 1 mg BID for EPS, Clozaril 50 mg in and and 100 mg in pm for psychosis -Individual counseling  Disposition: No evidence of imminent risk to self or others at present.    Waylan Boga, NP 01/10/2016 9:51 AM  Patient seen face-to-face for psychiatric evaluation, chart reviewed and case  discussed with the physician extender and developed treatment plan. Reviewed the information documented and agree with the treatment plan. Corena Pilgrim, MD

## 2016-01-10 NOTE — Progress Notes (Signed)
11:11am- CSW spoke with Con Memoslex Young [phone] to see what time he would be able to pick up patient today, as she has been psychiatrically cleared. He stated he would be here to pick up patient in fifteen minutes. Psychiatrist, NP, and Nurse made aware.    Elenore PaddyLaVonia Juniper Snyders, LCSWA [phone] ED CSW 01/10/2016 11:20 AM

## 2016-01-10 NOTE — Progress Notes (Signed)
10:01am- Patient was seen by Psychiatrist and NP and has been psychiatrically cleared for discharge. CSW attempted telephone call to Con [facility], [facility] [phone] to see what time patient could be picked up on today. Message left with name and contact number for return call.   Elenore PaddyLaVonia Jaelyn Bourgoin, LCSWA [phone] ED CSW 01/10/2016 10:05 AM
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
## 2016-02-01 IMAGING — CR DG KNEE COMPLETE 4+V*L*
4 series · 4 of 4 positions shown · non-contrast
Comparison: None

CLINICAL DATA: Generalize knee pain after fall

EXAM:
LEFT KNEE - COMPLETE 4+ VIEW

[t knee ap left]
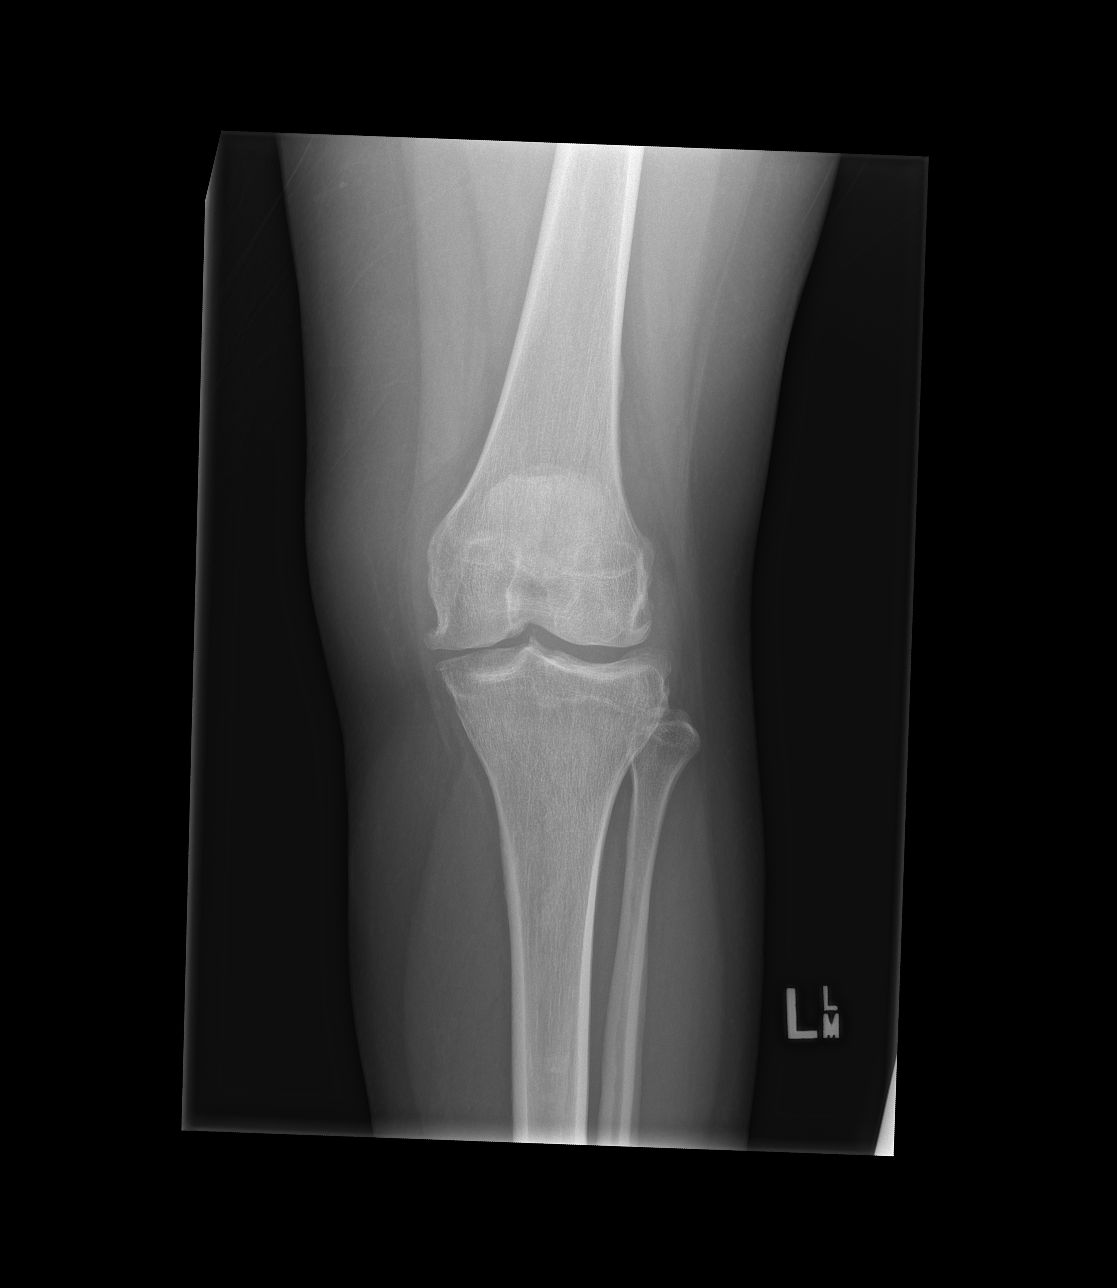

[t knee obl left (1 of 2)]
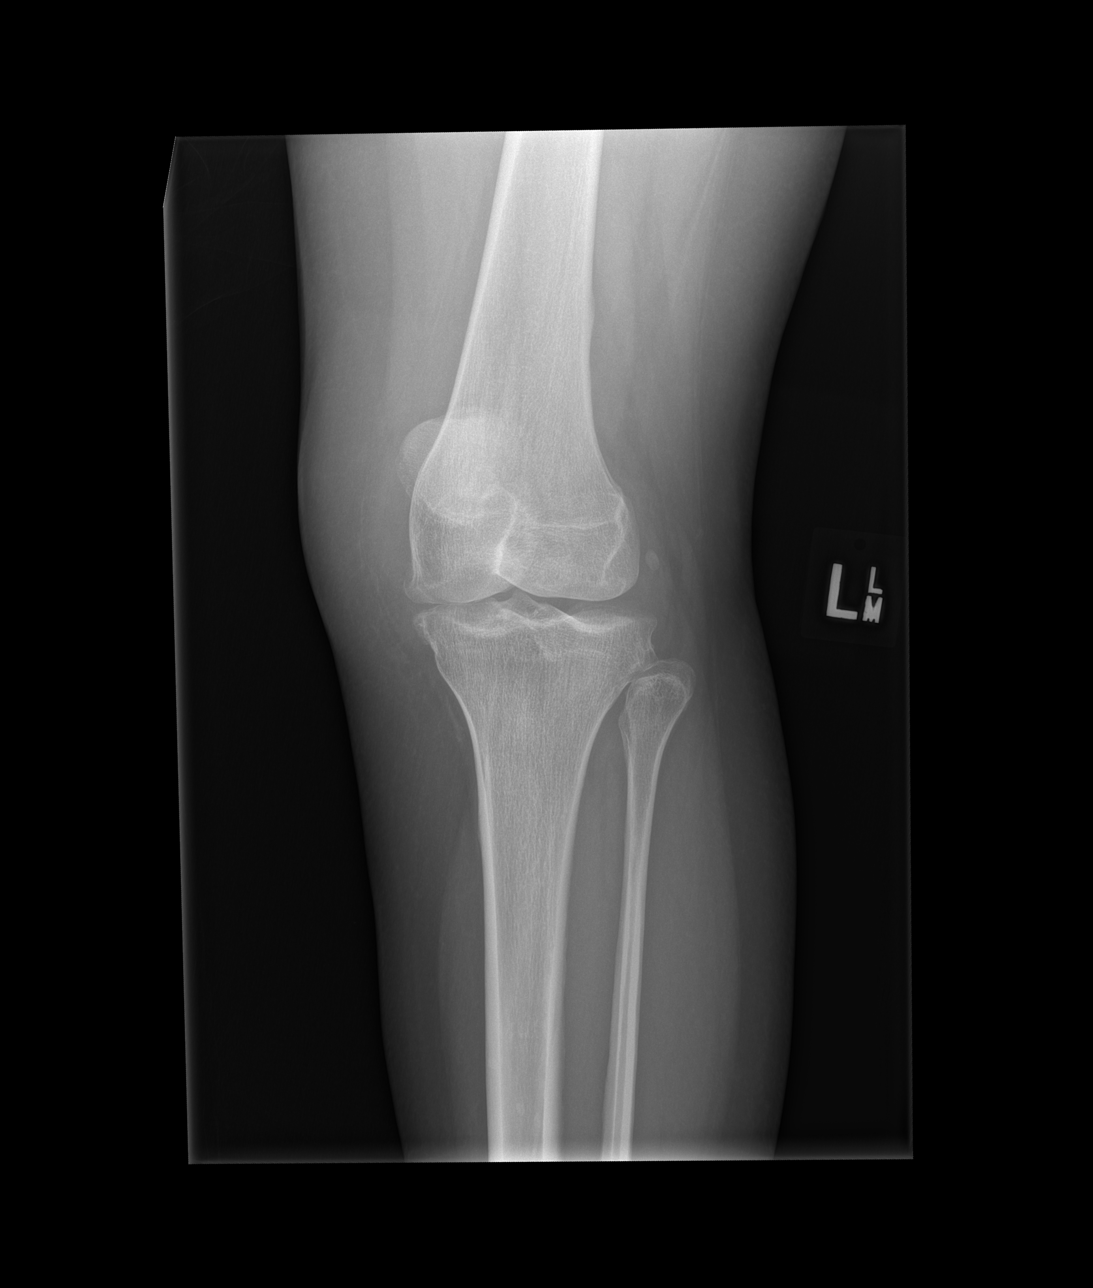

[t knee obl left (2 of 2)]
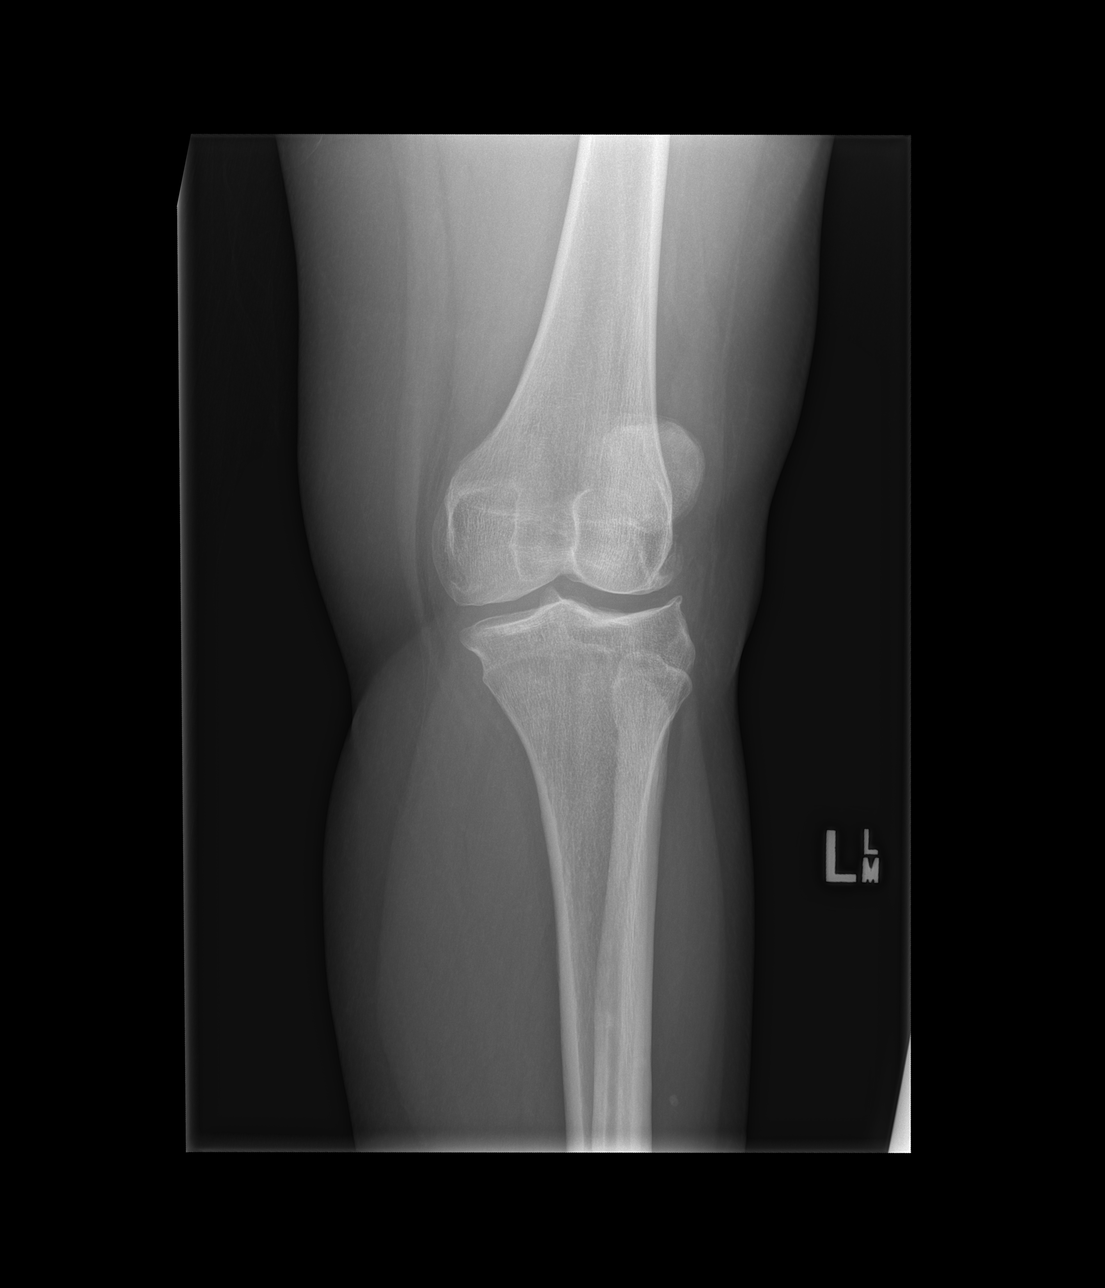

[t knee lat left]
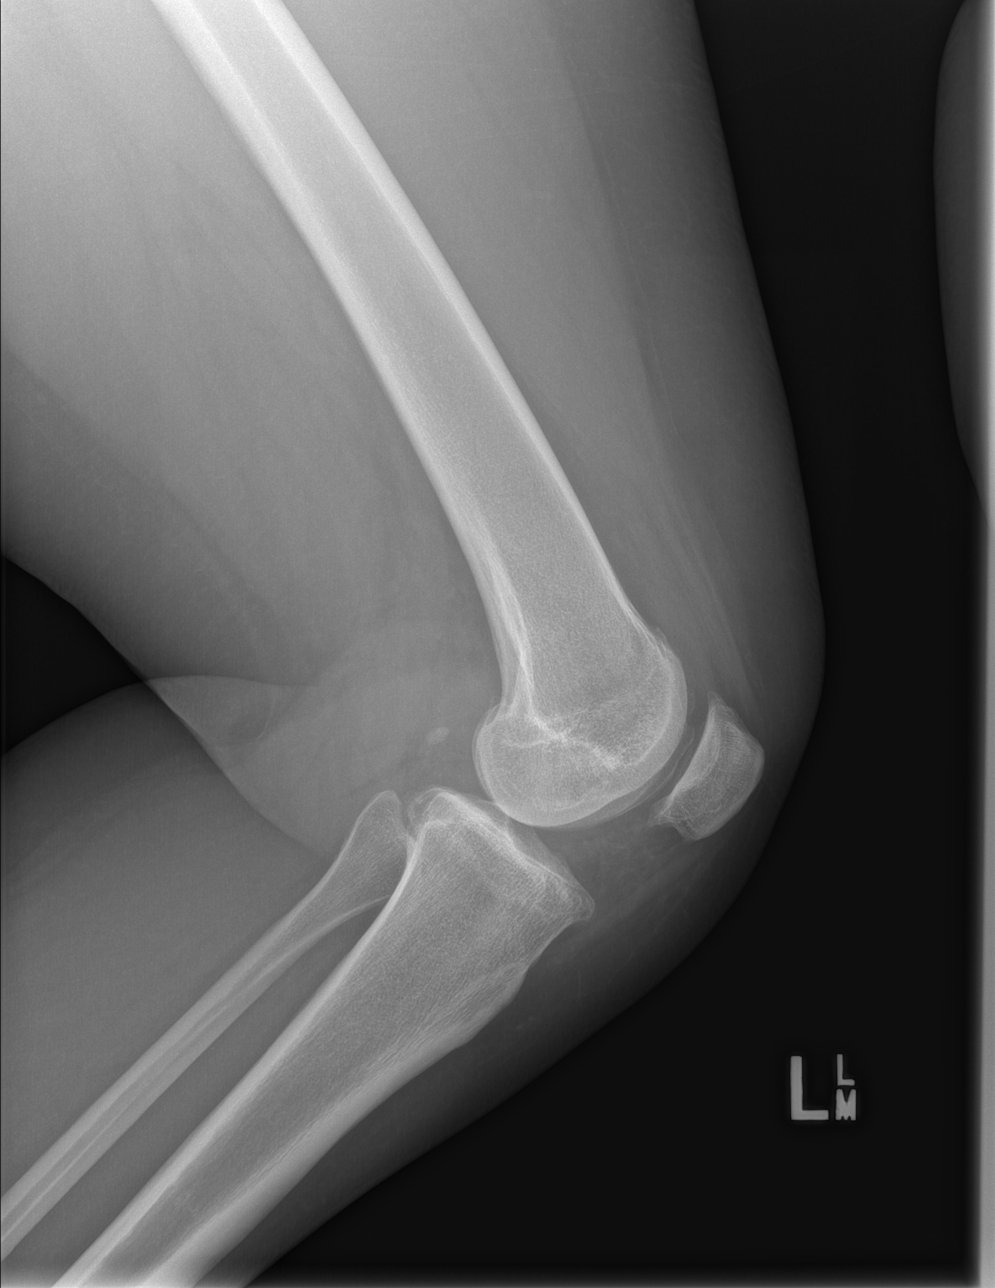

[4 of 4 positions shown; findings below may reference images not displayed]

FINDINGS: Moderate tricompartment osteoarthritis is noted. No joint effusion.
There is no fracture or subluxation identified.
IMPRESSION: 1. No acute findings.
2. Moderate tricompartment osteoarthritis.

## 2016-02-09 ENCOUNTER — Emergency Department (HOSPITAL_COMMUNITY)
Admission: EM | Admit: 2016-02-09 | Discharge: 2016-02-09 | Disposition: A | Discharge status: Home / Self Care | Payer: Medicaid Other | Attending: Physician Assistant | Admitting: Physician Assistant

## 2016-02-09 ENCOUNTER — Encounter (HOSPITAL_COMMUNITY): Payer: Self-pay | Admitting: Emergency Medicine

## 2016-02-09 DIAGNOSIS — Z79899 Other long term (current) drug therapy: Secondary | ICD-10-CM | POA: Diagnosis not present

## 2016-02-09 DIAGNOSIS — E119 Type 2 diabetes mellitus without complications: Secondary | ICD-10-CM | POA: Diagnosis not present

## 2016-02-09 DIAGNOSIS — R45851 Suicidal ideations: Secondary | ICD-10-CM | POA: Diagnosis present

## 2016-02-09 DIAGNOSIS — Z7984 Long term (current) use of oral hypoglycemic drugs: Secondary | ICD-10-CM | POA: Insufficient documentation

## 2016-02-09 DIAGNOSIS — Z87891 Personal history of nicotine dependence: Secondary | ICD-10-CM | POA: Diagnosis not present

## 2016-02-09 DIAGNOSIS — F259 Schizoaffective disorder, unspecified: Secondary | ICD-10-CM | POA: Insufficient documentation

## 2016-02-09 LAB — COMPREHENSIVE METABOLIC PANEL
ALBUMIN: 4 g/dL (ref 3.5–5.0)
ALT: 12 U/L — AB (ref 14–54)
ANION GAP: 4 — AB (ref 5–15)
AST: 21 U/L (ref 15–41)
Alkaline Phosphatase: 77 U/L (ref 38–126)
BUN: 9 mg/dL (ref 6–20)
CALCIUM: 9.1 mg/dL (ref 8.9–10.3)
CO2: 28 mmol/L (ref 22–32)
Chloride: 108 mmol/L (ref 101–111)
Creatinine, Ser: 0.59 mg/dL (ref 0.44–1.00)
GFR calc non Af Amer: >60 mL/min (ref >60)
GLUCOSE: 111 mg/dL — AB (ref 65–99)
POTASSIUM: 3.9 mmol/L (ref 3.5–5.1)
Sodium: 140 mmol/L (ref 135–145)
TOTAL PROTEIN: 7.6 g/dL (ref 6.5–8.1)
Total Bilirubin: 0.4 mg/dL (ref 0.3–1.2)

## 2016-02-09 LAB — CBC
HEMATOCRIT: 33.9 % — AB (ref 36.0–46.0)
Hemoglobin: 10.9 g/dL — ABNORMAL LOW (ref 12.0–15.0)
MCH: 28.9 pg (ref 26.0–34.0)
MCHC: 32.2 g/dL (ref 30.0–36.0)
MCV: 89.9 fL (ref 78.0–100.0)
Platelets: 294 10*3/uL (ref 150–400)
RBC: 3.77 MIL/uL — ABNORMAL LOW (ref 3.87–5.11)
RDW: 13.6 % (ref 11.5–15.5)
WBC: 6.1 10*3/uL (ref 4.0–10.5)

## 2016-02-09 LAB — POC URINE PREG, ED: Preg Test, Ur: NEGATIVE

## 2016-02-09 LAB — RAPID URINE DRUG SCREEN, HOSP PERFORMED
Amphetamines: NOT DETECTED
BARBITURATES: NOT DETECTED
BENZODIAZEPINES: POSITIVE — AB
COCAINE: NOT DETECTED
OPIATES: NOT DETECTED
Tetrahydrocannabinol: NOT DETECTED

## 2016-02-09 LAB — ETHANOL: Alcohol, Ethyl (B): <5 mg/dL (ref <5)

## 2016-02-09 LAB — ACETAMINOPHEN LEVEL

## 2016-02-09 LAB — SALICYLATE LEVEL
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
## 2016-02-15 ENCOUNTER — Emergency Department (HOSPITAL_COMMUNITY): Payer: Medicaid Other

## 2016-02-15 ENCOUNTER — Encounter (HOSPITAL_COMMUNITY): Payer: Self-pay | Admitting: *Deleted

## 2016-02-15 ENCOUNTER — Emergency Department (HOSPITAL_COMMUNITY)
Admission: EM | Admit: 2016-02-15 | Discharge: 2016-02-16 | Disposition: A | Discharge status: Home / Self Care | Payer: Medicaid Other | Attending: Emergency Medicine | Admitting: Emergency Medicine

## 2016-02-15 DIAGNOSIS — Y92199 Unspecified place in other specified residential institution as the place of occurrence of the external cause: Secondary | ICD-10-CM | POA: Diagnosis not present

## 2016-02-15 DIAGNOSIS — R441 Visual hallucinations: Secondary | ICD-10-CM | POA: Insufficient documentation

## 2016-02-15 DIAGNOSIS — Y999 Unspecified external cause status: Secondary | ICD-10-CM | POA: Insufficient documentation

## 2016-02-15 DIAGNOSIS — R0789 Other chest pain: Secondary | ICD-10-CM | POA: Insufficient documentation

## 2016-02-15 DIAGNOSIS — W1830XA Fall on same level, unspecified, initial encounter: Secondary | ICD-10-CM | POA: Diagnosis not present

## 2016-02-15 DIAGNOSIS — W19XXXA Unspecified fall, initial encounter: Secondary | ICD-10-CM

## 2016-02-15 DIAGNOSIS — R45851 Suicidal ideations: Secondary | ICD-10-CM | POA: Insufficient documentation

## 2016-02-15 DIAGNOSIS — Z87891 Personal history of nicotine dependence: Secondary | ICD-10-CM | POA: Diagnosis not present

## 2016-02-15 DIAGNOSIS — Y9302 Activity, running: Secondary | ICD-10-CM | POA: Insufficient documentation

## 2016-02-15 DIAGNOSIS — E119 Type 2 diabetes mellitus without complications: Secondary | ICD-10-CM | POA: Insufficient documentation

## 2016-02-15 DIAGNOSIS — R440 Auditory hallucinations: Secondary | ICD-10-CM | POA: Diagnosis not present

## 2016-02-15 DIAGNOSIS — F4325 Adjustment disorder with mixed disturbance of emotions and conduct: Secondary | ICD-10-CM | POA: Diagnosis present

## 2016-02-15 LAB — COMPREHENSIVE METABOLIC PANEL
ALK PHOS: 83 U/L (ref 38–126)
ALT: 14 U/L (ref 14–54)
AST: 20 U/L (ref 15–41)
Albumin: 4 g/dL (ref 3.5–5.0)
Anion gap: 7 (ref 5–15)
BILIRUBIN TOTAL: 0.3 mg/dL (ref 0.3–1.2)
BUN: 14 mg/dL (ref 6–20)
CALCIUM: 9.5 mg/dL (ref 8.9–10.3)
CO2: 26 mmol/L (ref 22–32)
CREATININE: 0.62 mg/dL (ref 0.44–1.00)
Chloride: 108 mmol/L (ref 101–111)
GFR calc Af Amer: >60 mL/min (ref >60)
Glucose, Bld: 87 mg/dL (ref 65–99)
POTASSIUM: 4.3 mmol/L (ref 3.5–5.1)
Sodium: 141 mmol/L (ref 135–145)
TOTAL PROTEIN: 7.3 g/dL (ref 6.5–8.1)

## 2016-02-15 LAB — RAPID URINE DRUG SCREEN, HOSP PERFORMED
Amphetamines: NOT DETECTED
BARBITURATES: NOT DETECTED
BENZODIAZEPINES: POSITIVE — AB
Cocaine: NOT DETECTED
Opiates: NOT DETECTED
Tetrahydrocannabinol: NOT DETECTED

## 2016-02-15 LAB — I-STAT BETA HCG BLOOD, ED (MC, WL, AP ONLY)

## 2016-02-15 LAB — CBC
HEMATOCRIT: 38 % (ref 36.0–46.0)
Hemoglobin: 11.7 g/dL — ABNORMAL LOW (ref 12.0–15.0)
MCH: 28.5 pg (ref 26.0–34.0)
MCHC: 30.8 g/dL (ref 30.0–36.0)
MCV: 92.7 fL (ref 78.0–100.0)
PLATELETS: 296 10*3/uL (ref 150–400)
RBC: 4.1 MIL/uL (ref 3.87–5.11)
RDW: 13.6 % (ref 11.5–15.5)
WBC: 8 10*3/uL (ref 4.0–10.5)

## 2016-02-15 MED ORDER — ACETAMINOPHEN 500 MG PO TABS: 1000.0000 mg | ORAL_TABLET | Freq: Once | ORAL | Status: DC

## 2016-02-15 NOTE — ED Notes (Addendum)
Call [place] [facility] - [phone] for assistance in placement or call his cell phone at [phone]

## 2016-02-15 NOTE — BH Assessment (Signed)
TTS Interview has been completed. Clinician consulted with Donell SievertSpencer Simon, PA and pt is recommended to be observed overnight and evaluated in the morning by psychiatry. Marcie BalKaitlynn, RN has been informed of pt disposition.
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
## 2016-02-15 NOTE — ED Notes (Signed)
Urine collected from pt was water from the bathroom sink.

## 2016-02-15 NOTE — ED Notes (Signed)
TTS in process at bedside.  ?
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
## 2016-02-15 NOTE — ED Notes (Signed)
Pt was placed in paper scrubs on arrival, wanded by security and have called to request a sitter. Pt repeatedly wonders from her room, even went down the hall and refused to come back. Had security and gpd help retrieve the patient from hallway.

## 2016-02-15 NOTE — ED Notes (Addendum)
Pt given 9 PM snack

## 2016-02-15 NOTE — ED Notes (Signed)
Received call that pt stays in group home. They state pt does not need admission and when dc you can call [facility] Anesraci Martin Jones #[phone].

## 2016-02-15 NOTE — ED Notes (Signed)
Call from TTS stating that they will re eval and TTS in the morning
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
## 2016-02-16 DIAGNOSIS — Z87891 Personal history of nicotine dependence: Secondary | ICD-10-CM

## 2016-02-16 DIAGNOSIS — F4325 Adjustment disorder with mixed disturbance of emotions and conduct: Secondary | ICD-10-CM

## 2016-02-16 MED ORDER — METFORMIN HCL ER 750 MG PO TB24
750.0000 mg | ORAL_TABLET | Freq: Every day | ORAL | Status: DC
  Administered 2016-02-16: 750 mg via ORAL
  Filled 2016-02-16: qty 1.5

## 2016-02-16 NOTE — ED Notes (Signed)
Spoke with the group home they are 15 minutes out to pick up the pt. Pt aware
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
## 2016-02-16 NOTE — ED Provider Notes (Signed)
Discussed with Renata Capriceonrad.  Pt is stable for discharge and outpatient follow up   Linwood DibblesJon Jarah Pember, MD 02/16/16 1334

## 2016-02-16 NOTE — ED Notes (Signed)
Pt found trying to walk out the EMS doors pt brought back in to room safety maintained

## 2016-02-16 NOTE — ED Notes (Signed)
Patient was given a snack and coffee during snack time, and a regular diet ordered for lunch.

## 2016-02-22 ENCOUNTER — Emergency Department (HOSPITAL_COMMUNITY): Payer: Medicaid Other

## 2016-02-22 ENCOUNTER — Encounter (HOSPITAL_COMMUNITY): Payer: Self-pay | Admitting: Emergency Medicine

## 2016-02-22 ENCOUNTER — Emergency Department (HOSPITAL_COMMUNITY)
Admission: EM | Admit: 2016-02-22 | Discharge: 2016-02-22 | Disposition: A | Discharge status: Home / Self Care | Payer: Medicaid Other | Attending: Emergency Medicine | Admitting: Emergency Medicine

## 2016-02-22 DIAGNOSIS — F4325 Adjustment disorder with mixed disturbance of emotions and conduct: Secondary | ICD-10-CM | POA: Insufficient documentation

## 2016-02-22 DIAGNOSIS — Z87891 Personal history of nicotine dependence: Secondary | ICD-10-CM | POA: Diagnosis not present

## 2016-02-22 DIAGNOSIS — R0789 Other chest pain: Secondary | ICD-10-CM | POA: Insufficient documentation

## 2016-02-22 DIAGNOSIS — Z79899 Other long term (current) drug therapy: Secondary | ICD-10-CM | POA: Insufficient documentation

## 2016-02-22 DIAGNOSIS — R45851 Suicidal ideations: Secondary | ICD-10-CM

## 2016-02-22 DIAGNOSIS — Z8659 Personal history of other mental and behavioral disorders: Secondary | ICD-10-CM

## 2016-02-22 LAB — BASIC METABOLIC PANEL
Anion gap: 7 (ref 5–15)
BUN: 13 mg/dL (ref 6–20)
CHLORIDE: 108 mmol/L (ref 101–111)
CO2: 24 mmol/L (ref 22–32)
CREATININE: 0.56 mg/dL (ref 0.44–1.00)
Calcium: 8.9 mg/dL (ref 8.9–10.3)
GFR calc non Af Amer: >60 mL/min (ref >60)
Glucose, Bld: 101 mg/dL — ABNORMAL HIGH (ref 65–99)
POTASSIUM: 3.6 mmol/L (ref 3.5–5.1)
SODIUM: 139 mmol/L (ref 135–145)

## 2016-02-22 LAB — CBC
HCT: 32.8 % — ABNORMAL LOW (ref 36.0–46.0)
Hemoglobin: 10.7 g/dL — ABNORMAL LOW (ref 12.0–15.0)
MCH: 28.5 pg (ref 26.0–34.0)
MCHC: 32.6 g/dL (ref 30.0–36.0)
MCV: 87.2 fL (ref 78.0–100.0)
PLATELETS: 277 10*3/uL (ref 150–400)
RBC: 3.76 MIL/uL — AB (ref 3.87–5.11)
RDW: 13.6 % (ref 11.5–15.5)
WBC: 8.9 10*3/uL (ref 4.0–10.5)

## 2016-02-22 LAB — I-STAT BETA HCG BLOOD, ED (MC, WL, AP ONLY)

## 2016-02-22 LAB — RAPID URINE DRUG SCREEN, HOSP PERFORMED
Amphetamines: NOT DETECTED
BENZODIAZEPINES: POSITIVE — AB
Barbiturates: NOT DETECTED
Cocaine: NOT DETECTED
Opiates: NOT DETECTED
Tetrahydrocannabinol: NOT DETECTED

## 2016-02-22 LAB — VALPROIC ACID LEVEL: VALPROIC ACID LVL: 26 ug/mL — AB (ref 50.0–100.0)

## 2016-02-22 LAB — I-STAT TROPONIN, ED: TROPONIN I, POC: 0 ng/mL (ref 0.00–0.08)

## 2016-02-22 MED ORDER — PANTOPRAZOLE SODIUM 40 MG PO TBEC
80.0000 mg | DELAYED_RELEASE_TABLET | Freq: Once | ORAL | Status: AC
  Administered 2016-02-22: 80 mg via ORAL
  Filled 2016-02-22: qty 2

## 2016-02-22 MED ORDER — STERILE WATER FOR INJECTION IJ SOLN
INTRAMUSCULAR | Status: AC
Start: 2016-02-22 — End: 2016-02-22
  Administered 2016-02-22: 2.5 mL
  Filled 2016-02-22: qty 10

## 2016-02-22 MED ORDER — ZIPRASIDONE MESYLATE 20 MG IM SOLR
10.0000 mg | Freq: Once | INTRAMUSCULAR | Status: AC
  Administered 2016-02-22: 10 mg via INTRAMUSCULAR
  Filled 2016-02-22: qty 20

## 2016-02-22 MED ORDER — FOLIC ACID 1 MG PO TABS
1.0000 mg | ORAL_TABLET | Freq: Every day | ORAL | Status: DC
  Administered 2016-02-22 (×2): 1 mg via ORAL
  Filled 2016-02-22 (×2): qty 1

## 2016-02-22 MED ORDER — DOCUSATE SODIUM 100 MG PO CAPS
100.0000 mg | ORAL_CAPSULE | Freq: Two times a day (BID) | ORAL | Status: DC
  Administered 2016-02-22 (×2): 100 mg via ORAL
  Filled 2016-02-22 (×2): qty 1

## 2016-02-22 MED ORDER — HALOPERIDOL 5 MG PO TABS
5.0000 mg | ORAL_TABLET | Freq: Four times a day (QID) | ORAL | Status: DC | PRN
  Filled 2016-02-22: qty 1

## 2016-02-22 MED ORDER — DIVALPROEX SODIUM ER 250 MG PO TB24
250.0000 mg | ORAL_TABLET | Freq: Two times a day (BID) | ORAL | Status: DC
  Administered 2016-02-22 (×2): 250 mg via ORAL
  Filled 2016-02-22 (×2): qty 1

## 2016-02-22 MED ORDER — GI COCKTAIL ~~LOC~~
30.0000 mL | Freq: Once | ORAL | Status: AC
  Administered 2016-02-22: 30 mL via ORAL
  Filled 2016-02-22: qty 30

## 2016-02-22 MED ORDER — IBUPROFEN 800 MG PO TABS
800.0000 mg | ORAL_TABLET | Freq: Once | ORAL | Status: AC
  Administered 2016-02-22: 800 mg via ORAL
  Filled 2016-02-22: qty 1

## 2016-02-22 MED ORDER — LORAZEPAM 2 MG/ML IJ SOLN
2.0000 mg | Freq: Once | INTRAMUSCULAR | Status: AC
  Administered 2016-02-22: 2 mg via INTRAMUSCULAR
  Filled 2016-02-22: qty 1

## 2016-02-22 NOTE — ED Notes (Signed)
Lord, J. Stated she was going to have patient's IVC papers rescinded and that the patient could have her door shut. Patient resting quietly at this time.
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
## 2016-02-22 NOTE — [facility] Suicide Risk Assessment (Signed)
Suicide Risk Assessment  Discharge Assessment   [facility]   Principal Problem: Adjustment disorder with mixed disturbance of emotions and conduct Discharge Diagnoses:  Patient Active Problem List   Diagnosis Date Noted  . Adjustment disorder with mixed disturbance of emotions and conduct [F43.25] 07/08/2015    Priority: High  . Schizophrenia, schizoaffective, chronic with acute exacerbation ([facility]) [F25.8] 06/06/2014  . Auditory hallucination [R44.0]   . Visual hallucination [R44.1]     Total Time spent with patient: 45 minutes  Musculoskeletal: Strength & Muscle Tone: within normal limits Gait & Station: normal Patient leans: N/A  Psychiatric Specialty Exam: Physical Exam  Constitutional: She is oriented to person, place, and time. She appears well-developed and well-nourished.  HENT:  Head: Normocephalic.  Neck: Normal range of motion.  Respiratory: Effort normal.  Musculoskeletal: Normal range of motion.  Neurological: She is alert and oriented to person, place, and time.  Skin: Skin is warm and dry.  Psychiatric: She has a normal mood and affect. Her speech is normal and behavior is normal. Judgment and thought content normal. Cognition and memory are normal.    Review of Systems  Constitutional: Negative.   HENT: Negative.   Eyes: Negative.   Respiratory: Negative.   Cardiovascular: Negative.   Gastrointestinal: Negative.   Genitourinary: Negative.   Musculoskeletal: Negative.   Skin: Negative.   Neurological: Negative.   Endo/Heme/Allergies: Negative.   Psychiatric/Behavioral: Negative.     Blood pressure 126/80, pulse 105, temperature 98.2 F (36.8 C), temperature source Oral, resp. rate 18, SpO2 100 %.There is no height or weight on file to calculate BMI.  General Appearance: Casual  Eye Contact:  Good  Speech:  Normal Rate  Volume:  Normal  Mood:  Euthymic  Affect:  Congruent  Thought Process:  Coherent and Descriptions of  Associations: Intact  Orientation:  Full (Time, Place, and Person)  Thought Content:  WDL  Suicidal Thoughts:  No  Homicidal Thoughts:  No  Memory:  Immediate;   Good Recent;   Good Remote;   Good  Judgement:  Fair  Insight:  Fair  Psychomotor Activity:  Normal  Concentration:  Concentration: Good and Attention Span: Good  Recall:  Good  Fund of Knowledge:  Fair  Language:  Good  Akathisia:  No  Handed:  Right  AIMS (if indicated):     Assets:  Housing Leisure Time Physical Health Resilience Social Support  ADL's:  Intact  Cognition:  WNL  Sleep:      Mental Status Per Nursing Assessment::   On Admission:   suicidal ideations  Demographic Factors:  NA  Loss Factors: NA  Historical Factors: NA  Risk Reduction Factors:   Sense of responsibility to family, Living with another person, especially a relative, Positive social support and Positive therapeutic relationship  Continued Clinical Symptoms:  None  Cognitive Features That Contribute To Risk:  None    Suicide Risk:  Minimal: No identifiable suicidal ideation.  Patients presenting with no risk factors but with morbid ruminations; may be classified as minimal risk based on the severity of the depressive symptoms    Plan Of Care/Follow-up recommendations:  Activity:  as tolerated Diet:  heart healthy diet  Lethia Donlon, NP 02/22/2016, 10:11 AM

## 2016-02-22 NOTE — Progress Notes (Signed)
Pt arrived on unit at 0500 and has been hostile and agitated since.  Pt had just received Geodon prior to arrival and we gave Ativan 2 IM.  4 security guards present trying to keep pt on this unit.  She has been extremely hostile.  She has been in the bathroom 30 times but states she cannot urinate.  I have asked for the bladder scanner and will scan her when possible. Hussam Muniz P Eiden Bagot

## 2016-02-22 NOTE — ED Notes (Signed)
On assessment patient endorses chest pain.  Does not respond when I ask her what pain level she is feeling.  Patient often speaking to herself and staring off into space.  Asked patient multiple times if she is feeling suicidal and she states no each time.

## 2016-02-22 NOTE — ED Notes (Signed)
Bed: WA30 Expected date:  Expected time:  Means of arrival:  Comments: 

## 2016-02-22 NOTE — ED Notes (Signed)
Pt came out to main ED roaming the hallways and would not stop for the TCU nurse.  MD made aware.  MD placed order for medication.  This nurse assisted assigned nurse with administration.

## 2016-02-22 NOTE — ED Notes (Signed)
Pt ambulatory to restroom.  Pt refusing to take her bra off and head wrap off at this time.

## 2016-02-22 NOTE — ED Notes (Signed)
Bed: WA12 Expected date:  Expected time:  Means of arrival:  Comments: Chest pain/mental health issue

## 2016-02-22 NOTE — BH Assessment (Addendum)
Tele Assessment Note   Brittany Velez is an 30 y.o. female. Presenting voluntarily to ED with complaints of chest pain and suicidal ideation (per chart). Pt is well known to this ID and appears to be functioning at baseline. Pt has history of schizophrenia. Pt was selective in questions she responded to during assessment and communicated that she did not want to participate in assessment. When asked what brought her to the Ed pt states "I had a seizure". Pt provides no additional explanation regarding seizure. Pt denies suicidal ideation. Pt is reported to have history of suicide attempt. Pt denies homicidal ideation. Pt denies hallucinations. Pt appears to be responding to internal stimuli during interview.  Pt is followed by [facility] team. Pt also resided in an [facility]. Per chart pt's IQ is reported to be in the mid-60s. Pt has a legal guardian with [facility] [employer].  Per RN note:  Group home member is at bedside.  He states that she is acting normally per baseline. States she has this behavior at home and is "playing games" with [place].    He will be going home and we can call him when we decide dispo.   His contact information:    Con Memos [phone]  Diagnosis: F20.9 Schizophrenia  Past Medical History:  Past Medical History:  Diagnosis Date  . Diabetes mellitus without complication (HCC)   . Retardation   . Schizo-affective schizophrenia (HCC)     History reviewed. No pertinent surgical history.  Family History: No family history on file.  Social History:  reports that she has quit smoking. Her smoking use included Cigarettes. She has never used smokeless tobacco. She reports that she does not drink alcohol or use drugs.  Additional Social History:  Alcohol / Drug Use Pain Medications: UTA- no abuse noted in pt chart Prescriptions: UTA- no abuse noted in pt chart Over the Counter: UTA- no abuse noted in pt chart History of alcohol / drug  use?: No history of alcohol / drug abuse  CIWA:   COWS:    PATIENT STRENGTHS: (choose at least two) General fund of knowledge  Allergies: No Known Allergies  Home Medications:  (Not in a hospital admission)  OB/GYN Status:  No LMP recorded (lmp unknown).  General Assessment Data Location of Assessment: [facility] TTS Assessment: In system Is this a Tele or Face-to-Face Assessment?: Tele Assessment Is this an Initial Assessment or a Re-assessment for this encounter?: Initial Assessment Marital status: Single Is patient pregnant?: Unknown Pregnancy Status: Unknown Living Arrangements: Group Home Can pt return to current living arrangement?: Yes Admission Status: Voluntary Is patient capable of signing voluntary admission?: No (Guardian) Referral Source: Self/Family/Friend Insurance type: Medicare     Crisis Care Plan Living Arrangements: Group Home Legal Guardian: Other: Pearlie Oyster- [facility]) Name of Psychiatrist: Dr.Akintayo Name of Therapist: None  Education Status Is patient currently in school?: No Highest grade of school patient has completed: 12 Name of school: NA Contact person: NA  Risk to self with the past 6 months Suicidal Ideation: No-Not Currently/Within Last 6 Months Has patient been a risk to self within the past 6 months prior to admission? : Yes Suicidal Intent: No Has patient had any suicidal intent within the past 6 months prior to admission? : No Is patient at risk for suicide?: No Suicidal Plan?: No Has patient had any suicidal plan within the past 6 months prior to admission? : Yes Access to Means: No What has been your  use of drugs/alcohol within the last 12 months?: None noted Previous Attempts/Gestures: Yes (per chart) How many times?: 2 (per chart) Triggers for Past Attempts: Unknown Intentional Self Injurious Behavior:  (UTA- none per chart) Family Suicide History: No Recent stressful life event(s):  (None  Reported) Persecutory voices/beliefs?: No Depression: No Substance abuse history and/or treatment for substance abuse?: No Suicide prevention information given to non-admitted patients: Yes  Risk to Others within the past 6 months Homicidal Ideation: No Does patient have any lifetime risk of violence toward others beyond the six months prior to admission? : No Thoughts of Harm to Others: No Current Homicidal Intent: No Current Homicidal Plan: No Access to Homicidal Means: No History of harm to others?: Yes (Chart notes h/o aggression towards others and property) Assessment of Violence: None Noted Violent Behavior Description: Pt chart notes history of aggression towards others and property Does patient have access to weapons?: No Criminal Charges Pending?: No Does patient have a court date: No Is patient on probation?: No  Psychosis Hallucinations:  (pt denies, appeared to be responding to internal stimuli) Delusions: None noted  Mental Status Report Appearance/Hygiene: In scrubs Eye Contact: Poor Motor Activity: Unremarkable Speech: Soft, Slow Level of Consciousness: Alert Mood: Ambivalent Affect: Constricted Anxiety Level: None Thought Processes: Relevant Judgement: Unable to Assess Orientation: Person, Place, Time, Situation Obsessive Compulsive Thoughts/Behaviors: None  Cognitive Functioning Concentration: Decreased Memory: Unable to Assess IQ: Below Average Level of Function: IQ noted to be in the mid-60s per chart Insight: Poor Impulse Control: Unable to Assess Appetite:  (UTA) Weight Loss:  (UTA) Weight Gain:  (UTA) Sleep: Unable to Assess Total Hours of Sleep:  (UTA) Vegetative Symptoms: Unable to Assess  ADLScreening [facility] Assessment Services) Patient's cognitive ability adequate to safely complete daily activities?: Yes Patient able to express need for assistance with ADLs?: Yes Independently performs ADLs?: Yes (appropriate for developmental  age)  Prior Inpatient Therapy Prior Inpatient Therapy: Yes (Per Chart) Prior Therapy Dates: 2017 Prior Therapy Facilty/Provider(s): [facility] Reason for Treatment: SI  Prior Outpatient Therapy Prior Outpatient Therapy: Yes (Per Chart) Prior Therapy Dates: Ongoing medication managment Prior Therapy Facilty/Provider(s): [facility] Reason for Treatment: Medication Management Does patient have an ACCT team?: Yes ([facility]) Does patient have Intensive In-House Services?  : No Does patient have [facility] services? : No (Per Chart) Does patient have P4CC services?: No  ADL Screening (condition at time of admission) Patient's cognitive ability adequate to safely complete daily activities?: Yes Is the patient deaf or have difficulty hearing?: No Does the patient have difficulty seeing, even when wearing glasses/contacts?: No Does the patient have difficulty concentrating, remembering, or making decisions?: Yes Patient able to express need for assistance with ADLs?: Yes Does the patient have difficulty dressing or bathing?: No Independently performs ADLs?: Yes (appropriate for developmental age) Does the patient have difficulty walking or climbing stairs?: No Weakness of Legs: None Weakness of Arms/Hands: None  Home Assistive Devices/Equipment Home Assistive Devices/Equipment: None  Therapy Consults (therapy consults require a physician order) PT Evaluation Needed: No OT Evalulation Needed: No SLP Evaluation Needed: No Abuse/Neglect Assessment (Assessment to be complete while patient is alone) Physical Abuse:  (UTA-Chart review reveals no history of abuse) Verbal Abuse:  (UTA-Chart review reveals no history of abuse) Sexual Abuse:  (UTA-Chart review reveals no history of abuse) Exploitation of patient/patient's resources: Denies Self-Neglect: Denies Values / Beliefs Spiritual Requests During Hospitalization: None Consults Spiritual Care Consult Needed: No Social Work  Consult Needed: No Merchant navy officer (For Healthcare) Does patient  have an advance directive?: No Would patient like information on creating an advanced directive?: No - patient declined information    Additional Information 1:1 In Past 12 Months?: No CIRT Risk: No Elopement Risk: No Does patient have medical clearance?: No     Disposition: Clinician consulted with Donell SievertSpencer Simon, PA and pt is recommended to follow up with current providers. Baxter HireKristen, RN informed of pt disposition.   Disposition Initial Assessment Completed for this Encounter: Yes Disposition of Patient: Other dispositions Other disposition(s): Other (Comment) (Pending Psychiatric Recommendation)  Uriah Philipson J [name] 02/22/2016 2:08 AM

## 2016-02-22 NOTE — Progress Notes (Addendum)
CSW spoke with Con MemosAlex Young, [facility], [phone] to inform him that patient had been psychiatrically cleared and ready for discharge. He stated he would come to pick up patient between 10:30/11:00am. Psychiatrist, NP, and TTS made aware.  Elenore PaddyLaVonia Maeola Velez, LCSWA [phone] ED CSW 02/22/2016 9:53 AM

## 2016-02-22 NOTE — ED Notes (Signed)
Pt repeatedly walking out of her room roaming the hallways.  Repeatedly told her to go back to her room.  Security walking with patient trying to encourage her to go back to her room.  Pt went to triage and began screaming at someone who was not there.  She was finally coached back to her room by security.  MD made aware.  TTS called and stated they saw her and had recommended she be discharged to her primary provider but pt was not having hallucinations at that time.  Will recommend AM psych eval if patient is IVC'd.

## 2016-02-22 NOTE — ED Notes (Signed)
Patient refusing to take bra off for x-ray.

## 2016-02-22 NOTE — BH Assessment (Signed)
[facility] Assessment Progress Note  Per Thedore MinsMojeed Akintayo, MD, this pt does not require psychiatric hospitalization at this time.  Pt presents under IVC initiated by EDP Kristen Ward, DO, which Dr Jannifer FranklinAkintayo has rescinded.  [facility]  She does not require any outpatient referrals.  Pt's nurse has been notified.  Doylene Canninghomas Cosmo Tetreault, MA Triage Specialist [phone]

## 2016-02-22 NOTE — ED Triage Notes (Addendum)
Pt comes from group home with complaints of chest pain.  She had ran away this evening and was reported missing by her group home members.  She was found at a gas station altered from baseline.  States she is suicidal and has chest pain. 324 of aspirin given in route. Sinus tach on the monitor. CBG 132.

## 2016-02-22 NOTE — ED Notes (Signed)
Pt given cheese and crackers 

## 2016-02-22 NOTE — ED Notes (Signed)
Pt came to nurses station asking this nurse why there are people following her everywhere.  This nurse reassured patient she was safe and there was not anyone following her.

## 2016-02-22 NOTE — ED Notes (Signed)
Patient transported to X-ray 

## 2016-02-22 NOTE — Progress Notes (Signed)
Pt left with Shakema from the group home. Pt appears pleasant and cooperative-Pt denies Si and HI.

## 2016-02-22 NOTE — Consult Note (Signed)
[facility] Face-to-Face Psychiatry Consult   Reason for Consult:  Suicidal ideations Referring Physician:  EDP Patient Identification: Lujean Kincaid MRN:  [id number] Principal Diagnosis: Adjustment disorder with mixed disturbance of emotions and conduct Diagnosis:   Patient Active Problem List   Diagnosis Date Noted  . Adjustment disorder with mixed disturbance of emotions and conduct [F43.25] 07/08/2015    Priority: High  . Schizophrenia, schizoaffective, chronic with acute exacerbation (HCC) [F25.8] 06/06/2014  . Auditory hallucination [R44.0]   . Visual hallucination [R44.1]     Total Time spent with patient: 45 minutes  Subjective:   Veeda Detwiler is a 30 y.o. female patient does not warrant admission.  HPI:  30 yo female who presented to the ED with suicidal ideations after getting upset.  She reported she ran away and fell down.  Today, she denies suicidal/homicidal ideations, hallucinations, and alcohol/drug abuse.  Blenda stated she likes her group home and does not know why she ran away.  Calm, cooperative, pleasant.  Stable for discharge.  Past Psychiatric History: schizoaffective disorder  Risk to Self: Suicidal Ideation: No-Not Currently/Within Last 6 Months Suicidal Intent: No Is patient at risk for suicide?: No Suicidal Plan?: No Access to Means: No What has been your use of drugs/alcohol within the last 12 months?: None noted How many times?: 2 (per chart) Triggers for Past Attempts: Unknown Intentional Self Injurious Behavior:  (UTA- none per chart) Risk to Others: Homicidal Ideation: No Thoughts of Harm to Others: No Current Homicidal Intent: No Current Homicidal Plan: No Access to Homicidal Means: No History of harm to others?: Yes (Chart notes h/o aggression towards others and property) Assessment of Violence: None Noted Violent Behavior Description: Pt chart notes history of aggression towards others and property Does patient have access to weapons?:  No Criminal Charges Pending?: No Does patient have a court date: No Prior Inpatient Therapy: Prior Inpatient Therapy: Yes (Per Chart) Prior Therapy Dates: 2017 Prior Therapy Facilty/Provider(s): [facility] Reason for Treatment: SI Prior Outpatient Therapy: Prior Outpatient Therapy: Yes (Per Chart) Prior Therapy Dates: Ongoing medication managment Prior Therapy Facilty/Provider(s): [facility] Reason for Treatment: Medication Management Does patient have an ACCT team?: Yes ([facility]) Does patient have Intensive In-House Services?  : No Does patient have [facility] services? : No (Per Chart) Does patient have P4CC services?: No  Past Medical History:  Past Medical History:  Diagnosis Date  . Diabetes mellitus without complication (HCC)   . Retardation   . Schizo-affective schizophrenia (HCC)    History reviewed. No pertinent surgical history. Family History: No family history on file. Family Psychiatric  History: none Social History:  History  Alcohol Use No     History  Drug Use No    Social History   Social History  . Marital status: Single    Spouse name: N/A  . Number of children: N/A  . Years of education: N/A   Social History Main Topics  . Smoking status: Former Smoker    Types: Cigarettes  . Smokeless tobacco: Never Used  . Alcohol use No  . Drug use: No  . Sexual activity: Yes   Other Topics Concern  . None   Social History Narrative  . None   Additional Social History:    Allergies:  No Known Allergies  Labs:  Results for orders placed or performed during the hospital encounter of 02/22/16 (from the past 48 hour(s))  Rapid urine drug screen (hospital performed)     Status: Abnormal   Collection Time: 02/22/16 12:37 AM    Result Value Ref Range   Opiates NONE DETECTED NONE DETECTED   Cocaine NONE DETECTED NONE DETECTED   Benzodiazepines POSITIVE (A) NONE DETECTED   Amphetamines NONE DETECTED NONE DETECTED   Tetrahydrocannabinol NONE DETECTED  NONE DETECTED   Barbiturates NONE DETECTED NONE DETECTED    Comment:        DRUG SCREEN FOR MEDICAL PURPOSES ONLY.  IF CONFIRMATION IS NEEDED FOR ANY PURPOSE, NOTIFY LAB WITHIN 5 DAYS.        LOWEST DETECTABLE LIMITS FOR URINE DRUG SCREEN Drug Class       Cutoff (ng/mL) Amphetamine      1000 Barbiturate      200 Benzodiazepine   366 Tricyclics       294 Opiates          300 Cocaine          300 THC              50   Basic metabolic panel     Status: Abnormal   Collection Time: 02/22/16  1:12 AM  Result Value Ref Range   Sodium 139 135 - 145 mmol/L   Potassium 3.6 3.5 - 5.1 mmol/L   Chloride 108 101 - 111 mmol/L   CO2 24 22 - 32 mmol/L   Glucose, Bld 101 (H) 65 - 99 mg/dL   BUN 13 6 - 20 mg/dL   Creatinine, Ser 0.56 0.44 - 1.00 mg/dL   Calcium 8.9 8.9 - 10.3 mg/dL   GFR calc non Af Amer >60 >60 mL/min   GFR calc Af Amer >60 >60 mL/min    Comment: (NOTE) The eGFR has been calculated using the CKD EPI equation. This calculation has not been validated in all clinical situations. eGFR's persistently <60 mL/min signify possible Chronic Kidney Disease.    Anion gap 7 5 - 15  CBC     Status: Abnormal   Collection Time: 02/22/16  1:12 AM  Result Value Ref Range   WBC 8.9 4.0 - 10.5 K/uL   RBC 3.76 (L) 3.87 - 5.11 MIL/uL   Hemoglobin 10.7 (L) 12.0 - 15.0 g/dL   HCT 32.8 (L) 36.0 - 46.0 %   MCV 87.2 78.0 - 100.0 fL   MCH 28.5 26.0 - 34.0 pg   MCHC 32.6 30.0 - 36.0 g/dL   RDW 13.6 11.5 - 15.5 %   Platelets 277 150 - 400 K/uL  I-stat troponin, ED     Status: None   Collection Time: 02/22/16  1:20 AM  Result Value Ref Range   Troponin i, poc 0.00 0.00 - 0.08 ng/mL   Comment 3            Comment: Due to the release kinetics of cTnI, a negative result within the first hours of the onset of symptoms does not rule out myocardial infarction with certainty. If myocardial infarction is still suspected, repeat the test at appropriate intervals.   I-Stat beta hCG blood, ED      Status: None   Collection Time: 02/22/16  1:21 AM  Result Value Ref Range   I-stat hCG, quantitative <5.0 <5 mIU/mL   Comment 3            Comment:   GEST. AGE      CONC.  (mIU/mL)   <=1 WEEK        5 - 50     2 WEEKS       50 - 500     3 WEEKS  100 - 10,000     4 WEEKS     1,000 - 30,000        FEMALE AND NON-PREGNANT FEMALE:     LESS THAN 5 mIU/mL   Valproic acid level     Status: Abnormal   Collection Time: 02/22/16  1:39 AM  Result Value Ref Range   Valproic Acid Lvl 26 (L) 50.0 - 100.0 ug/mL    Current Facility-Administered Medications  Medication Dose Route Frequency Provider Last Rate Last Dose  . divalproex (DEPAKOTE ER) 24 hr tablet 250 mg  250 mg Oral BID Kristen N Ward, DO   250 mg at 02/22/16 0951  . docusate sodium (COLACE) capsule 100 mg  100 mg Oral BID Kristen N Ward, DO   100 mg at 02/22/16 0951  . folic acid (FOLVITE) tablet 1 mg  1 mg Oral Daily Kristen N Ward, DO   1 mg at 02/22/16 0951  . haloperidol (HALDOL) tablet 5 mg  5 mg Oral Q6H PRN Delice Bison Ward, DO       Current Outpatient Prescriptions  Medication Sig Dispense Refill  . divalproex (DEPAKOTE ER) 250 MG 24 hr tablet Take 250 mg by mouth 2 (two) times daily.    Mariane Baumgarten Sodium (DOK PO) Take 2 tablets by mouth at bedtime.    . folic acid (FOLVITE) 1 MG tablet Take 1 mg by mouth daily.    . haloperidol (HALDOL) 5 MG tablet Take 5 mg by mouth every 6 (six) hours as needed for agitation.       Musculoskeletal: Strength & Muscle Tone: within normal limits Gait & Station: normal Patient leans: N/A  Psychiatric Specialty Exam: Physical Exam  Constitutional: She is oriented to person, place, and time. She appears well-developed and well-nourished.  HENT:  Head: Normocephalic.  Neck: Normal range of motion.  Respiratory: Effort normal.  Musculoskeletal: Normal range of motion.  Neurological: She is alert and oriented to person, place, and time.  Skin: Skin is warm and dry.  Psychiatric: She  has a normal mood and affect. Her speech is normal and behavior is normal. Judgment and thought content normal. Cognition and memory are normal.    Review of Systems  Constitutional: Negative.   HENT: Negative.   Eyes: Negative.   Respiratory: Negative.   Cardiovascular: Negative.   Gastrointestinal: Negative.   Genitourinary: Negative.   Musculoskeletal: Negative.   Skin: Negative.   Neurological: Negative.   Endo/Heme/Allergies: Negative.   Psychiatric/Behavioral: Negative.     Blood pressure 126/80, pulse 105, temperature 98.2 F (36.8 C), temperature source Oral, resp. rate 18, SpO2 100 %.There is no height or weight on file to calculate BMI.  General Appearance: Casual  Eye Contact:  Good  Speech:  Normal Rate  Volume:  Normal  Mood:  Euthymic  Affect:  Congruent  Thought Process:  Coherent and Descriptions of Associations: Intact  Orientation:  Full (Time, Place, and Person)  Thought Content:  WDL  Suicidal Thoughts:  No  Homicidal Thoughts:  No  Memory:  Immediate;   Good Recent;   Good Remote;   Good  Judgement:  Fair  Insight:  Fair  Psychomotor Activity:  Normal  Concentration:  Concentration: Good and Attention Span: Good  Recall:  Good  Fund of Knowledge:  Fair  Language:  Good  Akathisia:  No  Handed:  Right  AIMS (if indicated):     Assets:  Housing Leisure Time Physical Health Resilience Social Support  ADL's:  Intact  Cognition:  WNL  Sleep:        Treatment Plan Summary: Daily contact with patient to assess and evaluate symptoms and progress in treatment, Medication management and Plan adjustement disorder with mixed disturbance of emotions and conduct:  -Crisis stabilization -Medication management:  Continued medical medications along with Depakote 250 mg BID for mood stabilization and Haldol 5 mg every six hours PRN agitation -Individual counseling  Disposition: No evidence of imminent risk to self or others at present.    Waylan Boga,  NP 02/22/2016 10:05 AM  Patient seen face-to-face for psychiatric evaluation, chart reviewed and case discussed with the physician extender and developed treatment plan. Reviewed the information documented and agree with the treatment plan. Corena Pilgrim, MD

## 2016-02-22 NOTE — ED Notes (Addendum)
Group home member is at bedside.  He states that she is acting normally per baseline. States she has this behavior at home and is "playing games" with us.   He will be going home and we can call him when we decide dispo.  His contact information:   Con Memoslex Young [phone]

## 2016-03-25 ENCOUNTER — Emergency Department (HOSPITAL_COMMUNITY)
Admission: EM | Admit: 2016-03-25 | Discharge: 2016-03-25 | Disposition: A | Discharge status: Home / Self Care | Payer: Medicaid Other | Attending: Emergency Medicine | Admitting: Emergency Medicine

## 2016-03-25 ENCOUNTER — Emergency Department (HOSPITAL_COMMUNITY): Payer: Medicaid Other

## 2016-03-25 ENCOUNTER — Encounter (HOSPITAL_COMMUNITY): Payer: Self-pay

## 2016-03-25 DIAGNOSIS — I10 Essential (primary) hypertension: Secondary | ICD-10-CM

## 2016-03-25 DIAGNOSIS — W19XXXA Unspecified fall, initial encounter: Secondary | ICD-10-CM | POA: Insufficient documentation

## 2016-03-25 DIAGNOSIS — E119 Type 2 diabetes mellitus without complications: Secondary | ICD-10-CM | POA: Diagnosis not present

## 2016-03-25 DIAGNOSIS — S92321A Displaced fracture of second metatarsal bone, right foot, initial encounter for closed fracture: Secondary | ICD-10-CM | POA: Diagnosis not present

## 2016-03-25 DIAGNOSIS — Z87891 Personal history of nicotine dependence: Secondary | ICD-10-CM | POA: Diagnosis not present

## 2016-03-25 DIAGNOSIS — Y939 Activity, unspecified: Secondary | ICD-10-CM | POA: Insufficient documentation

## 2016-03-25 DIAGNOSIS — Y999 Unspecified external cause status: Secondary | ICD-10-CM | POA: Insufficient documentation

## 2016-03-25 DIAGNOSIS — S99921A Unspecified injury of right foot, initial encounter: Secondary | ICD-10-CM | POA: Diagnosis present

## 2016-03-25 DIAGNOSIS — Y929 Unspecified place or not applicable: Secondary | ICD-10-CM | POA: Insufficient documentation

## 2016-03-25 MED ORDER — ONDANSETRON 4 MG PO TBDP
4.0000 mg | ORAL_TABLET | Freq: Once | ORAL | Status: AC
  Administered 2016-03-25: 4 mg via ORAL
  Filled 2016-03-25: qty 1

## 2016-03-25 MED ORDER — HYDROCODONE-ACETAMINOPHEN 5-325 MG PO TABS
2.0000 | ORAL_TABLET | Freq: Once | ORAL | Status: AC
  Administered 2016-03-25: 2 via ORAL
  Filled 2016-03-25: qty 2

## 2016-03-25 MED ORDER — HYDROCODONE-ACETAMINOPHEN 5-325 MG PO TABS: 1.0000 | ORAL_TABLET | Freq: Four times a day (QID) | ORAL | 0 refills | Status: DC | PRN

## 2016-03-25 NOTE — Progress Notes (Signed)
13:16: Spoke with PT who will perform PT eval and make recommendations for Appropriate DME. Will continue to follow.

## 2016-03-25 NOTE — ED Notes (Signed)
Patient transported to X-ray 
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
## 2016-03-25 NOTE — ED Notes (Signed)
Waiting for care management. 

## 2016-03-25 NOTE — Discharge Instructions (Signed)
SEEK MEDICAL CARE IF: You have pain that is getting worse. You develop chills or fever. Your foot feels numb. Your cast or splint is damaged. You notice swelling or redness below your cast or splint.

## 2016-03-25 NOTE — Progress Notes (Signed)
Orthopedic Tech Progress Note Patient Details:  Brittany FellerLinda Velez 11/25/1985 [id number]  Ortho Devices Type of Ortho Device: CAM walker Ortho Device/Splint Location: rle Ortho Device/Splint Interventions: Application   Brittany Velez 03/25/2016, 11:50 AM

## 2016-03-25 NOTE — ED Notes (Signed)
oRTHO TECH IN
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
## 2016-03-25 NOTE — ED Notes (Signed)
PT in to consult regarding walker.
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
## 2016-04-21 ENCOUNTER — Emergency Department (HOSPITAL_COMMUNITY)
Admission: EM | Admit: 2016-04-21 | Discharge: 2016-04-22 | Disposition: A | Discharge status: Home / Self Care | Payer: Medicaid Other | Attending: Emergency Medicine | Admitting: Emergency Medicine

## 2016-04-21 ENCOUNTER — Encounter (HOSPITAL_COMMUNITY): Payer: Self-pay | Admitting: Emergency Medicine

## 2016-04-21 DIAGNOSIS — R45851 Suicidal ideations: Secondary | ICD-10-CM

## 2016-04-21 DIAGNOSIS — E119 Type 2 diabetes mellitus without complications: Secondary | ICD-10-CM | POA: Insufficient documentation

## 2016-04-21 DIAGNOSIS — F4325 Adjustment disorder with mixed disturbance of emotions and conduct: Secondary | ICD-10-CM | POA: Diagnosis not present

## 2016-04-21 DIAGNOSIS — F258 Other schizoaffective disorders: Secondary | ICD-10-CM | POA: Diagnosis not present

## 2016-04-21 DIAGNOSIS — Z87891 Personal history of nicotine dependence: Secondary | ICD-10-CM | POA: Diagnosis not present

## 2016-04-21 DIAGNOSIS — Z79899 Other long term (current) drug therapy: Secondary | ICD-10-CM | POA: Insufficient documentation

## 2016-04-21 LAB — SALICYLATE LEVEL: Salicylate Lvl: <7 mg/dL (ref 2.8–30.0)

## 2016-04-21 LAB — COMPREHENSIVE METABOLIC PANEL
ALBUMIN: 3.5 g/dL (ref 3.5–5.0)
ALK PHOS: 74 U/L (ref 38–126)
ALT: 10 U/L — ABNORMAL LOW (ref 14–54)
ANION GAP: 8 (ref 5–15)
AST: 19 U/L (ref 15–41)
BILIRUBIN TOTAL: 0.4 mg/dL (ref 0.3–1.2)
BUN: 15 mg/dL (ref 6–20)
CALCIUM: 8.7 mg/dL — AB (ref 8.9–10.3)
CO2: 24 mmol/L (ref 22–32)
Chloride: 106 mmol/L (ref 101–111)
Creatinine, Ser: 0.69 mg/dL (ref 0.44–1.00)
GFR calc Af Amer: >60 mL/min (ref >60)
GLUCOSE: 126 mg/dL — AB (ref 65–99)
POTASSIUM: 3.6 mmol/L (ref 3.5–5.1)
Sodium: 138 mmol/L (ref 135–145)
TOTAL PROTEIN: 6.6 g/dL (ref 6.5–8.1)

## 2016-04-21 LAB — DIFFERENTIAL
Basophils Absolute: 0 10*3/uL (ref 0.0–0.1)
Basophils Relative: 0 %
Eosinophils Absolute: 0.2 10*3/uL (ref 0.0–0.7)
Eosinophils Relative: 3 %
LYMPHS ABS: 2.4 10*3/uL (ref 0.7–4.0)
Lymphocytes Relative: 33 %
Monocytes Absolute: 0.5 10*3/uL (ref 0.1–1.0)
Monocytes Relative: 7 %
NEUTROS ABS: 4.1 10*3/uL (ref 1.7–7.7)
NEUTROS PCT: 57 %

## 2016-04-21 LAB — RAPID URINE DRUG SCREEN, HOSP PERFORMED
AMPHETAMINES: NOT DETECTED
Barbiturates: NOT DETECTED
Benzodiazepines: NOT DETECTED
Cocaine: NOT DETECTED
OPIATES: NOT DETECTED
TETRAHYDROCANNABINOL: NOT DETECTED

## 2016-04-21 LAB — CBC
HCT: 32.6 % — ABNORMAL LOW (ref 36.0–46.0)
HEMOGLOBIN: 10.4 g/dL — AB (ref 12.0–15.0)
MCH: 28.9 pg (ref 26.0–34.0)
MCHC: 31.9 g/dL (ref 30.0–36.0)
MCV: 90.6 fL (ref 78.0–100.0)
PLATELETS: 279 10*3/uL (ref 150–400)
RBC: 3.6 MIL/uL — AB (ref 3.87–5.11)
RDW: 13.9 % (ref 11.5–15.5)
WBC: 7.3 10*3/uL (ref 4.0–10.5)

## 2016-04-21 LAB — ACETAMINOPHEN LEVEL

## 2016-04-21 LAB — ETHANOL

## 2016-04-21 MED ORDER — HALOPERIDOL 5 MG PO TABS
5.0000 mg | ORAL_TABLET | Freq: Four times a day (QID) | ORAL | Status: DC | PRN
  Administered 2016-04-22: 5 mg via ORAL
  Filled 2016-04-21: qty 1

## 2016-04-21 MED ORDER — FOLIC ACID 1 MG PO TABS
1.0000 mg | ORAL_TABLET | Freq: Every day | ORAL | Status: DC
  Administered 2016-04-22: 1 mg via ORAL
  Filled 2016-04-21: qty 1

## 2016-04-21 MED ORDER — DIVALPROEX SODIUM ER 250 MG PO TB24
250.0000 mg | ORAL_TABLET | Freq: Two times a day (BID) | ORAL | Status: DC
  Administered 2016-04-22: 250 mg via ORAL
  Filled 2016-04-21 (×2): qty 1

## 2016-04-21 MED ORDER — HALOPERIDOL 5 MG PO TABS
10.0000 mg | ORAL_TABLET | Freq: Every day | ORAL | Status: DC
  Administered 2016-04-21: 10 mg via ORAL
  Filled 2016-04-21: qty 2

## 2016-04-21 MED ORDER — DOCUSATE SODIUM 100 MG PO CAPS: 200.0000 mg | ORAL_CAPSULE | ORAL | Status: DC | PRN

## 2016-04-21 MED ORDER — BENZTROPINE MESYLATE 1 MG PO TABS
1.0000 mg | ORAL_TABLET | Freq: Two times a day (BID) | ORAL | Status: DC
  Administered 2016-04-21 – 2016-04-22 (×2): 1 mg via ORAL
  Filled 2016-04-21 (×2): qty 1

## 2016-04-21 MED ORDER — CLOZAPINE 100 MG PO TABS
250.0000 mg | ORAL_TABLET | Freq: Every day | ORAL | Status: DC
  Administered 2016-04-22: 250 mg via ORAL
  Filled 2016-04-21: qty 3

## 2016-04-21 MED ORDER — LORAZEPAM 1 MG PO TABS
2.0000 mg | ORAL_TABLET | Freq: Four times a day (QID) | ORAL | Status: DC | PRN
  Administered 2016-04-22: 2 mg via ORAL
  Filled 2016-04-21: qty 2

## 2016-04-21 MED ORDER — SENNOSIDES-DOCUSATE SODIUM 8.6-50 MG PO TABS
1.0000 | ORAL_TABLET | Freq: Two times a day (BID) | ORAL | Status: DC
  Administered 2016-04-21 – 2016-04-22 (×2): 1 via ORAL
  Filled 2016-04-21 (×2): qty 1

## 2016-04-21 MED ORDER — CLOZAPINE 100 MG PO TABS
600.0000 mg | ORAL_TABLET | Freq: Every day | ORAL | Status: DC
  Administered 2016-04-21: 600 mg via ORAL
  Filled 2016-04-21: qty 6

## 2016-04-21 MED ORDER — DIVALPROEX SODIUM ER 500 MG PO TB24
1000.0000 mg | ORAL_TABLET | Freq: Two times a day (BID) | ORAL | Status: DC
  Administered 2016-04-21 – 2016-04-22 (×2): 1000 mg via ORAL
  Filled 2016-04-21 (×2): qty 2

## 2016-04-21 MED ORDER — METFORMIN HCL ER 750 MG PO TB24
750.0000 mg | ORAL_TABLET | Freq: Every day | ORAL | Status: DC
  Administered 2016-04-22: 750 mg via ORAL
  Filled 2016-04-21 (×2): qty 1

## 2016-04-21 NOTE — ED Notes (Signed)
Bed: [id number] Expected date: 04/21/16 Expected time: 5:10 PM Means of arrival:  Comments: SI

## 2016-04-21 NOTE — [facility] Counselor (Signed)
When the pt is d/c, please contact Con Memoslex Young (QP) at [phone] to arrange for the pt to be picked up and transported back to the group home.   Princess BruinsAquicha Duff, MSW, Theresia MajorsLCSWA
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
## 2016-04-22 DIAGNOSIS — F258 Other schizoaffective disorders: Secondary | ICD-10-CM

## 2016-04-22 DIAGNOSIS — Z87891 Personal history of nicotine dependence: Secondary | ICD-10-CM

## 2016-04-22 DIAGNOSIS — Z79899 Other long term (current) drug therapy: Secondary | ICD-10-CM

## 2016-04-22 DIAGNOSIS — F4325 Adjustment disorder with mixed disturbance of emotions and conduct: Secondary | ICD-10-CM | POA: Diagnosis not present

## 2016-04-22 NOTE — [facility] Suicide Risk Assessment (Signed)
Suicide Risk Assessment  Discharge Assessment   [facility]   Principal Problem: Adjustment disorder with mixed disturbance of emotions and conduct Discharge Diagnoses:  Patient Active Problem List   Diagnosis Date Noted  . Adjustment disorder with mixed disturbance of emotions and conduct [F43.25] 07/08/2015  . Schizophrenia, schizoaffective, chronic with acute exacerbation (HCC) [F25.8] 06/06/2014  . Auditory hallucination [R44.0]   . Visual hallucination [R44.1]     Total Time spent with patient: 15 minutes  Musculoskeletal: Strength & Muscle Tone: within normal limits Gait & Station: normal Patient leans: N/A  Psychiatric Specialty Exam:   Blood pressure 134/83, pulse 99, temperature 98.4 F (36.9 C), temperature source Oral, resp. rate 20, SpO2 98 %.There is no height or weight on file to calculate BMI.   General Appearance: Fairly Groomed  Eye Contact:  Fair  Speech:  Clear and Coherent and Slow  Volume:  Decreased  Mood:  Calm and cooperative  Affect:  Congruent  Thought Process:  Coherent and Descriptions of Associations: Circumstantial  Orientation:  Full (Time, Place, and Person)  Thought Content:  Logical  Suicidal Thoughts:  No  Homicidal Thoughts:  No  Memory:  Immediate;   Fair Recent;   Fair  Judgement:  Fair  Insight:  Lacking  Psychomotor Activity:  Normal  Concentration:  Concentration: Fair and Attention Span: Fair  Recall:  FiservFair  Fund of Knowledge:  Fair  Language:  Fair  Akathisia:  No  Handed:  Right  AIMS (if indicated):     Assets:  Communication Skills Desire for Improvement Financial Resources/Insurance Housing  ADL's:  Intact  Cognition:  Impaired,  Mild  Sleep:      Mental Status Per Nursing Assessment::   On Admission:     Demographic Factors:  NA  Loss Factors: NA  Historical Factors: NA  Risk Reduction Factors:   Living with another person, especially a relative, Positive social support and  Positive therapeutic relationship  Continued Clinical Symptoms:  Schizophrenia:   Less than 32 years old  Cognitive Features That Contribute To Risk:  Loss of executive function    Suicide Risk:  Minimal: No identifiable suicidal ideation.  Patients presenting with no risk factors but with morbid ruminations; may be classified as minimal risk based on the severity of the depressive symptoms  Plan Of Care/Follow-up recommendations:  Activity:  as tolerated Diet:  regular    Alberteen SamFran Adreana Coull, FNP-BC [facility] 04/22/2016, 11:31 AM

## 2016-04-22 NOTE — Progress Notes (Signed)
CSW contaced Con Memoslex Young [phone]) and informed him that patient is ready for discharge and needs transportation back to group home. Alex reported that he would send someone to pick patient up by 12:10 pm.
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
## 2016-04-22 NOTE — ED Notes (Signed)
Sitter at bedside.

## 2016-04-22 NOTE — ED Notes (Signed)
Patient is discharged with caregiver.  She is ambulatory.  Caregiver and patient understood discharge instructions.

## 2016-04-22 NOTE — ED Notes (Signed)
Pt given cheese per request.  

## 2016-04-22 NOTE — ED Notes (Signed)
Mr. Maple HudsonYoung stated someone would be here around 12:30 pm to pick up patient  [phone]
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
## 2016-04-22 NOTE — Discharge Instructions (Signed)
Adjustment Disorder Adjustment disorder is an unusually severe reaction to a stressful life event, such as the loss of a job or physical illness. The event may be any stressful event other than the loss of a loved one. Adjustment disorder may affect your feelings, your thinking, how you act, or a combination of these. It may interfere with personal relationships or with the way you are at work, school, or home. People with this disorder are at risk for suicide and substance abuse. They may develop a more serious mental disorder, such as major depressive disorder or post-traumatic stress disorder. SIGNS AND SYMPTOMS  Symptoms may include:  Sadness, depressed mood, or crying spells.  Loss of enjoyment.  Change in appetite or weight.  Sense of loss or hopelessness.  Thoughts of suicide.  Anxiety, worry, or nervousness.  Trouble sleeping.  Avoiding family and friends.  Poor school performance.  Fighting or vandalism.  Reckless driving.  Skipping school.  Poor work performance.  Ignoring bills. Symptoms of adjustment disorder start within 3 months of the stressful life event. They do not last more than 6 months after the event has ended. DIAGNOSIS  To make a diagnosis, your health care provider will ask about what has happened in your life and how it has affected you. He or she may also ask about your medical history and use of medicines, alcohol, and other substances. Your health care provider may do a physical exam and order lab tests or other studies. You may be referred to a mental health specialist for evaluation. TREATMENT  Treatment options include:  Counseling or talk therapy. Talk therapy is usually provided by mental health specialists.  Medicine. Certain medicines may help with depression, anxiety, and sleep.  Support groups. Support groups offer emotional support, advice, and guidance. They are made up of people who have had similar experiences. HOME CARE  INSTRUCTIONS  Keep all follow-up visits as directed by your health care provider. This is important.  Take medicines only as directed by your health care provider. SEEK MEDICAL CARE IF:  Your symptoms get worse.  SEEK IMMEDIATE MEDICAL CARE IF: You have serious thoughts about hurting yourself or someone else. MAKE SURE YOU:  Understand these instructions.  Will watch your condition.  Will get help right away if you are not doing well or get worse. This information is not intended to replace advice given to you by your health care provider. Make sure you discuss any questions you have with your health care provider. Document Released: 01/02/2006 Document Revised: 08/22/2015 Document Reviewed: 09/22/2013 Elsevier Interactive Patient Education  2017 Elsevier Inc.  

## 2016-05-21 ENCOUNTER — Emergency Department (HOSPITAL_COMMUNITY)
Admission: EM | Admit: 2016-05-21 | Discharge: 2016-05-21 | Disposition: A | Discharge status: Home / Self Care | Payer: Medicaid Other | Attending: Emergency Medicine | Admitting: Emergency Medicine

## 2016-05-21 ENCOUNTER — Encounter (HOSPITAL_COMMUNITY): Payer: Self-pay

## 2016-05-21 DIAGNOSIS — E119 Type 2 diabetes mellitus without complications: Secondary | ICD-10-CM | POA: Diagnosis not present

## 2016-05-21 DIAGNOSIS — F918 Other conduct disorders: Secondary | ICD-10-CM | POA: Diagnosis present

## 2016-05-21 DIAGNOSIS — Z87891 Personal history of nicotine dependence: Secondary | ICD-10-CM | POA: Diagnosis not present

## 2016-05-21 DIAGNOSIS — Z7984 Long term (current) use of oral hypoglycemic drugs: Secondary | ICD-10-CM | POA: Diagnosis not present

## 2016-05-21 DIAGNOSIS — R4689 Other symptoms and signs involving appearance and behavior: Secondary | ICD-10-CM

## 2016-05-21 NOTE — ED Notes (Signed)
Followed up w/ Group Home.  Sts someone should be here in 5-10 minutes to pick up the Pt.

## 2016-05-21 NOTE — ED Provider Notes (Signed)
[facility]-EMERGENCY DEPT Provider Note   CSN: [id number] Arrival date & time: 05/21/16  0931     History   Chief Complaint Chief Complaint  Patient presents with  . Social Work Issue    HPI Brittany FellerLinda Velez is a 31 y.o. female.  She presents for evaluation of a behavioral outburst at her group home this morning. She was transferred by EMS were concerned that she is not getting along well with the group home providers. A frequent and recurrent problem for the patient. She is well known to staff here at the emergency department. Patient states she's not sure what happened this morning. She is comfortable sitting on the floor during the interview.  Level V caveat- altered mental status   HPI  Past Medical History:  Diagnosis Date  . Diabetes mellitus without complication ([facility])   . Retardation   . Schizo-affective schizophrenia [facility](HCC)     Patient Active Problem List   Diagnosis Date Noted  . 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 Schizophrenia, schizoaffective, chronic with acute exacerbation ([facility]) 06/06/2014  . Auditory hallucination   . Visual hallucination     History reviewed. No pertinent surgical history.  OB History    No data available       Home Medications    Prior to Admission medications   Medication Sig Start Date End Date Taking? Authorizing Provider  benztropine (COGENTIN) 1 MG tablet Take 1 mg by mouth 2 (two) times daily.    Historical Provider, MD  clozapine (CLOZARIL) 200 MG tablet Take 200-600 mg by mouth 2 (two) times daily. Take 200 mg in the morning along with 50 mg tablet for total dose of 250mg  in the morning, Then at bedtime take 600 mg    Historical Provider, MD  clozapine (CLOZARIL) 50 MG tablet Take 50 mg by mouth every morning.    Historical Provider, MD  divalproex (DEPAKOTE ER) 250 MG 24 hr tablet Take 250 mg by mouth 2 (two) times daily. Take along with Depakote ER 500 mg Tablet for total dose of  1250 mg    Historical Provider, MD  divalproex (DEPAKOTE ER) 500 MG 24 hr tablet Take 1,000 mg by mouth 2 (two) times daily. Take along with Depakote ER 250 mg Tablet for total dose of 1250 mg    Historical Provider, MD  docusate sodium (COLACE) 100 MG capsule Take 200 mg by mouth every 2 (two) hours as needed for mild constipation or moderate constipation.    Historical Provider, MD  folic acid (FOLVITE) 1 MG tablet Take 1 mg by mouth daily.    Historical Provider, MD  haloperidol (HALDOL) 10 MG tablet Take 10 mg by mouth at bedtime.    Historical Provider, MD  haloperidol (HALDOL) 5 MG tablet Take 5 mg by mouth every 6 (six) hours as needed (psychosis).     Historical Provider, MD  LORazepam (ATIVAN) 2 MG tablet Take 2 mg by mouth every 6 (six) hours as needed (agitation).    Historical Provider, MD  metFORMIN (GLUCOPHAGE-XR) 750 MG 24 hr tablet Take 750 mg by mouth daily with breakfast.    Historical Provider, MD  senna-docusate (SENOKOT-S) 8.6-50 MG tablet Take 1 tablet by mouth 2 (two) times daily.    Historical Provider, MD    Family History History reviewed. No pertinent family history.  Social History Social History  Substance Use Topics  . Smoking status: Former Smoker    Types: Cigarettes  . Smokeless tobacco: Never  Used  . Alcohol use No     Allergies   Patient has no known allergies.   Review of Systems Review of Systems  Unable to perform ROS: Mental status change     Physical Exam Updated Vital Signs BP 125/81   Pulse (!) 136   Temp 98.2 F (36.8 C) (Oral)   Resp 20   SpO2 98%   Physical Exam  Constitutional: She appears well-developed and well-nourished. No distress.  HENT:  Head: Normocephalic and atraumatic.  Eyes: Conjunctivae and EOM are normal. Pupils are equal, round, and reactive to light.  Neck: Normal range of motion and phonation normal. Neck supple.  Cardiovascular: Normal rate.   Pulmonary/Chest: Effort normal. She exhibits no tenderness.    Musculoskeletal: Normal range of motion.  Neurological: She is alert. She exhibits normal muscle tone.  Skin: Skin is warm and dry.  Psychiatric: She has a normal mood and affect. Her behavior is normal.  Nursing note and vitals reviewed.    ED Treatments / Results  Labs (all labs ordered are listed, but only abnormal results are displayed) Labs Reviewed - No data to display  EKG  EKG Interpretation None       Radiology No results found.  Procedures Procedures (including critical care time)  Medications Ordered in ED Medications - No data to display   Initial Impression / Assessment and Plan / ED Course  I have reviewed the triage vital signs and the nursing notes.  Pertinent labs & imaging results that were available during my care of the patient were reviewed by me and considered in my medical decision making (see chart for details).  Clinical Course     Medications - No data to display  Patient Vitals for the past 24 hrs:  BP Temp Temp src Pulse Resp SpO2  05/21/16 1254 138/95 - - (!) 124 16 97 %  05/21/16 0933 125/81 98.2 F (36.8 C) Oral (!) 136 20 98 %  05/21/16 0932 - - - - - 96 %    12:37 PM Reevaluation with update and discussion. After initial assessment and treatment, an updated evaluation reveals No change in clinical status. Group home has been contacted by the patient's nurse and they are coming to get the patient. Ravin Bendall L    Final Clinical Impressions(s) / ED Diagnoses   Final diagnoses:  Behavioral change    Nonspecific, behavioral out-burst, with patient being at baseline for her usual mental status. No indication for further evaluation or treatment in the ED setting. The patient is in a stable and safe home environment.   Nursing Notes Reviewed/ Care Coordinated Applicable Imaging Reviewed Interpretation of Laboratory Data incorporated into ED treatment  The patient appears reasonably screened and/or stabilized for discharge  and I doubt any other medical condition or other [facility] requiring further screening, evaluation, or treatment in the ED at this time prior to discharge.  Plan: Home Medications- continue; Home Treatments- rest; return here if the recommended treatment, does not improve the symptoms; Recommended follow up- PCP prn  New Prescriptions New Prescriptions   No medications on file     Mancel Bale, MD 05/21/16 1259

## 2016-05-21 NOTE — ED Notes (Signed)
Pt stated that she needed to use the restroom. Pt went to restroom but did not urinate. Pt then ran out of the bathroom attempting to leave, while pants/belt were undone, when security stopped her. Pt threw herself on the floor in the middle of hall. Pt refused to get up. When bringing Pt back to Triage RN charge RN had to ask Pt several multiple times to sit down and Pt refused to cooperate.

## 2016-05-21 NOTE — ED Triage Notes (Addendum)
Per PTAR, Pt, from Group Home, presents d/t Pt not liking her Group Home.  Pt is known to run away from home frequently.  PTAR reports that Group Home staff was yelling at Pt, so they "decided to take her away and her to the ED."  PTAR feels that Social Work needs to get involved.     Pt refuses to answer questions and is consistently wanting to leave.  Security at bedside.

## 2016-05-21 NOTE — ED Notes (Signed)
This Clinical research associatewriter spoke to [employer] w/ Group Home [phone].  Sts the Pt is welcome back and some will come to pick her up.

## 2016-05-21 NOTE — ED Notes (Signed)
Pt sat down in the floor and cannot be redirected.  Pt reports that she does not know why she was brought to the ED.  Security remains at bedside.

## 2016-05-21 NOTE — Discharge Instructions (Signed)
Follow-up with your doctor and therapist as scheduled, and as needed.  Take all of your medications as prescribed.

## 2016-05-21 NOTE — ED Notes (Signed)
Group Home staff arrived.  D/c paperwork provided.  Pt calm and following commands.

## 2016-09-04 ENCOUNTER — Emergency Department (HOSPITAL_COMMUNITY)
Admission: EM | Admit: 2016-09-04 | Discharge: 2016-09-04 | Discharge status: Absent without leave | Payer: Medicaid Other

## 2016-09-04 NOTE — ED Triage Notes (Signed)
Per EMS- Patient was at a psych facility receiving treatment. Patient c/o abdominal pain and states that she has not had a BM in 2 weeks.

## 2016-09-04 NOTE — ED Notes (Signed)
While receiving report from EMS, the patient ran out of the ED and down throught the parking lot. Charge nurse notified. Will call Emergency contact.

## 2016-09-04 NOTE — ED Notes (Signed)
Gordan Payment, director of the group home notified of patient leaving.

## 2016-11-02 ENCOUNTER — Encounter (HOSPITAL_COMMUNITY): Payer: Self-pay | Admitting: Emergency Medicine

## 2016-11-02 ENCOUNTER — Emergency Department (HOSPITAL_COMMUNITY)
Admission: EM | Admit: 2016-11-02 | Discharge: 2016-11-02 | Disposition: A | Discharge status: Home / Self Care | Payer: Medicaid Other | Attending: Emergency Medicine | Admitting: Emergency Medicine

## 2016-11-02 DIAGNOSIS — Z79899 Other long term (current) drug therapy: Secondary | ICD-10-CM | POA: Insufficient documentation

## 2016-11-02 DIAGNOSIS — Z7984 Long term (current) use of oral hypoglycemic drugs: Secondary | ICD-10-CM | POA: Insufficient documentation

## 2016-11-02 DIAGNOSIS — Z87891 Personal history of nicotine dependence: Secondary | ICD-10-CM | POA: Insufficient documentation

## 2016-11-02 DIAGNOSIS — R45851 Suicidal ideations: Secondary | ICD-10-CM | POA: Diagnosis not present

## 2016-11-02 DIAGNOSIS — F209 Schizophrenia, unspecified: Secondary | ICD-10-CM | POA: Insufficient documentation

## 2016-11-02 LAB — CBC WITH DIFFERENTIAL/PLATELET
Basophils Absolute: 0 10*3/uL (ref 0.0–0.1)
Basophils Relative: 0 %
Eosinophils Absolute: 0.1 10*3/uL (ref 0.0–0.7)
Eosinophils Relative: 1 %
HEMATOCRIT: 32.1 % — AB (ref 36.0–46.0)
HEMOGLOBIN: 10.2 g/dL — AB (ref 12.0–15.0)
LYMPHS ABS: 1.9 10*3/uL (ref 0.7–4.0)
Lymphocytes Relative: 24 %
MCH: 29.6 pg (ref 26.0–34.0)
MCHC: 31.8 g/dL (ref 30.0–36.0)
MCV: 93 fL (ref 78.0–100.0)
Monocytes Absolute: 0.6 10*3/uL (ref 0.1–1.0)
Monocytes Relative: 8 %
NEUTROS ABS: 5.4 10*3/uL (ref 1.7–7.7)
NEUTROS PCT: 67 %
Platelets: 225 10*3/uL (ref 150–400)
RBC: 3.45 MIL/uL — ABNORMAL LOW (ref 3.87–5.11)
RDW: 15.7 % — ABNORMAL HIGH (ref 11.5–15.5)
WBC: 8 10*3/uL (ref 4.0–10.5)

## 2016-11-02 LAB — VALPROIC ACID LEVEL: Valproic Acid Lvl: 108 ug/mL — ABNORMAL HIGH (ref 50.0–100.0)

## 2016-11-02 MED ORDER — CLOZAPINE 100 MG PO TABS
250.0000 mg | ORAL_TABLET | Freq: Every day | ORAL | Status: DC
Start: 2016-11-03 — End: 2016-11-02

## 2016-11-02 MED ORDER — CLOZAPINE 100 MG PO TABS: 200.0000 mg | ORAL_TABLET | Freq: Two times a day (BID) | ORAL | Status: DC

## 2016-11-02 MED ORDER — CLOZAPINE 100 MG PO TABS: 600.0000 mg | ORAL_TABLET | Freq: Every day | ORAL | Status: DC

## 2016-11-02 MED ORDER — BENZTROPINE MESYLATE 1 MG PO TABS: 1.0000 mg | ORAL_TABLET | Freq: Two times a day (BID) | ORAL | Status: DC

## 2016-11-02 MED ORDER — SENNOSIDES-DOCUSATE SODIUM 8.6-50 MG PO TABS: 1.0000 | ORAL_TABLET | Freq: Two times a day (BID) | ORAL | Status: DC

## 2016-11-02 MED ORDER — DIVALPROEX SODIUM ER 250 MG PO TB24: 250.0000 mg | ORAL_TABLET | Freq: Two times a day (BID) | ORAL | Status: DC

## 2016-11-02 MED ORDER — DIVALPROEX SODIUM ER 500 MG PO TB24: 1250.0000 mg | ORAL_TABLET | Freq: Two times a day (BID) | ORAL | Status: DC

## 2016-11-02 MED ORDER — DIVALPROEX SODIUM ER 500 MG PO TB24: 1000.0000 mg | ORAL_TABLET | Freq: Two times a day (BID) | ORAL | Status: DC

## 2016-11-02 MED ORDER — FOLIC ACID 1 MG PO TABS: 1.0000 mg | ORAL_TABLET | Freq: Every day | ORAL | Status: DC

## 2016-11-02 MED ORDER — METFORMIN HCL ER 750 MG PO TB24: 750.0000 mg | ORAL_TABLET | Freq: Every day | ORAL | Status: DC

## 2016-11-02 NOTE — ED Provider Notes (Signed)
[facility]-EMERGENCY DEPT Provider Note   CSN: [id number] Arrival date & time: 11/02/16  1150     History   Chief Complaint Chief Complaint  Patient presents with  . Suicidal    HPI Brittany FellerLinda Velez is a 31 y.o. female.   HPI    31 year old female presents today with complaints of suicidal ideation.  Patient was brought via EMS and GPT.  She was getting an annual physical when she ran out of the facility and into a stranger's home.  Patient voiced suicidal ideations and was brought here for further evaluation.  Patient reports that she would like to kill herself, noting she would like to stab herself in the neck with a stick.  She reports she has tried this in the past.  She is uncertain why she has not tried it today.  She also reports she wants to harm other people, no one in particular.  She denies any drug or alcohol use, reports she is taking her behavioral health medications but is unable to provide which medication she takes.   Past Medical History:  Diagnosis Date  . Diabetes mellitus without complication (HCC)   . Retardation   . Schizo-affective schizophrenia [facility](HCC)     Patient Active Problem List   Diagnosis Date Noted  . 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 Schizophrenia, schizoaffective, chronic with acute exacerbation (HCC) 06/06/2014  . Auditory hallucination   . Visual hallucination     History reviewed. No pertinent surgical history.  OB History    No data available       Home Medications    Prior to Admission medications   Medication Sig Start Date End Date Taking? Authorizing Provider  benztropine (COGENTIN) 1 MG tablet Take 1 mg by mouth 2 (two) times daily.   Yes [provider]  clozapine (CLOZARIL) 200 MG tablet Take 200-600 mg by mouth 2 (two) times daily. Take 200 mg in the morning along with 50 mg tablet for total dose of 250mg  in the morning, Then at bedtime take 600 mg   Yes [provider]  clozapine (CLOZARIL) 50 MG tablet Take 50 mg by mouth every morning.   Yes [provider]  divalproex (DEPAKOTE ER) 250 MG 24 hr tablet Take 250 mg by mouth 2 (two) times daily. Take along with Depakote ER 500 mg Tablet for total dose of 1250 mg   Yes [provider]  divalproex (DEPAKOTE ER) 500 MG 24 hr tablet Take 1,000 mg by mouth 2 (two) times daily. Take along with Depakote ER 250 mg Tablet for total dose of 1250 mg   Yes [provider]  docusate sodium (COLACE) 100 MG capsule Take 200 mg by mouth every 2 (two) hours as needed for mild constipation or moderate constipation.    [provider]  folic acid (FOLVITE) 1 MG tablet Take 1 mg by mouth daily.    [provider]  haloperidol (HALDOL) 10 MG tablet Take 10 mg by mouth at bedtime.    [provider]  haloperidol (HALDOL) 5 MG tablet Take 5 mg by mouth every 6 (six) hours as needed (psychosis).     [provider]  LORazepam (ATIVAN) 2 MG tablet Take 2 mg by mouth every 6 (six) hours as needed (agitation).    [provider]  metFORMIN (GLUCOPHAGE-XR) 750 MG 24 hr tablet Take 750 mg by mouth daily with breakfast.    [provider]  senna-docusate (SENOKOT-S)  8.6-50 MG tablet Take 1 tablet by mouth 2 (two) times daily.    [provider]    Family History No family history on file.  Social History Social History  Substance Use Topics  . Smoking status: Former Smoker    Types: Cigarettes  . Smokeless tobacco: Never Used  . Alcohol use No     Allergies   Patient has no known allergies.   Review of Systems Review of Systems  All other systems reviewed and are negative.    Physical Exam Updated Vital Signs BP 125/76   Pulse (!) 111   Temp 98.4 F (36.9 C) (Oral)   Resp 19   SpO2 97%   Physical Exam  Constitutional: She is oriented to person, place, and time. She appears well-developed and well-nourished.    HENT:  Head: Normocephalic and atraumatic.  Eyes: Conjunctivae are normal. Pupils are equal, round, and reactive to light. Right eye exhibits no discharge. Left eye exhibits no discharge. No scleral icterus.  Neck: Normal range of motion. No JVD present. No tracheal deviation present.  Pulmonary/Chest: Effort normal. No stridor.  Neurological: She is alert and oriented to person, place, and time. Coordination normal.  Psychiatric: She has a normal mood and affect. Her behavior is normal. Judgment and thought content normal.  Nursing note and vitals reviewed.    ED Treatments / Results  Labs (all labs ordered are listed, but only abnormal results are displayed) Labs Reviewed  VALPROIC ACID LEVEL - Abnormal; Notable for the following:       Result Value   Valproic Acid Lvl 108 (*)    All other components within normal limits  CBC WITH DIFFERENTIAL/PLATELET - Abnormal; Notable for the following:    RBC 3.45 (*)    Hemoglobin 10.2 (*)    HCT 32.1 (*)    RDW 15.7 (*)    All other components within normal limits    EKG  EKG Interpretation None       Radiology No results found.  Procedures Procedures (including critical care time)  Medications Ordered in ED Medications - No data to display   Initial Impression / Assessment and Plan / ED Course  I have reviewed the triage vital signs and the nursing notes.  Pertinent labs & imaging results that were available during my care of the patient were reviewed by me and considered in my medical decision making (see chart for details).     Final Clinical Impressions(s) / ED Diagnoses   Final diagnoses:  Suicidal ideation  Schizophrenia, unspecified type (HCC)    Labs: valproic, cbc  Imaging:  Consults: TTS  Therapeutics:  Discharge Meds:   Assessment/Plan: 31 year old female presents today with suicidal ideations.  Patient has mental retardation, schizophrenia.  She reports diffuse tenderness to every inch of her  body including her fingernails.  Patient's presentation is consistent with previous, she is well-known by behavioral health staff.  They evaluated the patient and felt that she was not appropriate for continued management in the setting.  I agree patient will be discharged home to group home.  No need for further evaluation and management here in the ED.  New Prescriptions Discharge Medication List as of 11/02/2016  2:48 PM       Eyvonne Mechanic, PA-C 11/02/16 2316    Arby Barrette, MD 11/23/16 [phone]

## 2016-11-02 NOTE — ED Notes (Signed)
Bed: [facility] Expected date:  Expected time:  Means of arrival:  Comments: EMS SI 

## 2016-11-02 NOTE — ED Notes (Signed)
Notified caregiver, Donette LarryGwendolyn Smith that pt is ready to be p/u

## 2016-11-02 NOTE — ED Notes (Signed)
Per Trey PaulaJeff, [place] d/c meds ordered by NP d/t pt being d/c back to group home

## 2016-11-02 NOTE — ED Notes (Signed)
Group home counselor: [phone] Con MemosAlex Young

## 2016-11-02 NOTE — Progress Notes (Signed)
Patient is well known to this ED and providers.  She got in trouble earlier and said she was suicidal, police brought her here.  Brittany QuinLinda reports living with her parents but she lives in a group home.  She is at her baseline and can return.  No threat to herself or others at this time.  Stable for discharge from a psychiatric perspective.  [name], PMHNP

## 2016-11-02 NOTE — Discharge Instructions (Signed)
Please read attached information. If you experience any new or worsening signs or symptoms please return to the emergency room for evaluation. Please follow-up with your primary care provider or specialist as discussed.  Please contact your behavioral specialist and primary care informed of today's visit and all relevant data.

## 2016-11-02 NOTE — ED Triage Notes (Signed)
Per EMS/[employer]-patient lives at group home-states she was [place]-states they were about to do blood work when she eloped and was found in a women's home-[employer] was called r/t trespassing call-states patient voiced SI-states she wants to to straight to [facility]-per patient's history, this behavior is a common occurance

## 2016-11-03 IMAGING — CR DG CHEST 2V
2 series · 2 of 2 positions shown · non-contrast
Comparison: Chest radiograph 02/15/2016

CLINICAL DATA: Mid chest pain.

EXAM:
CHEST  2 VIEW

[w chest lat]
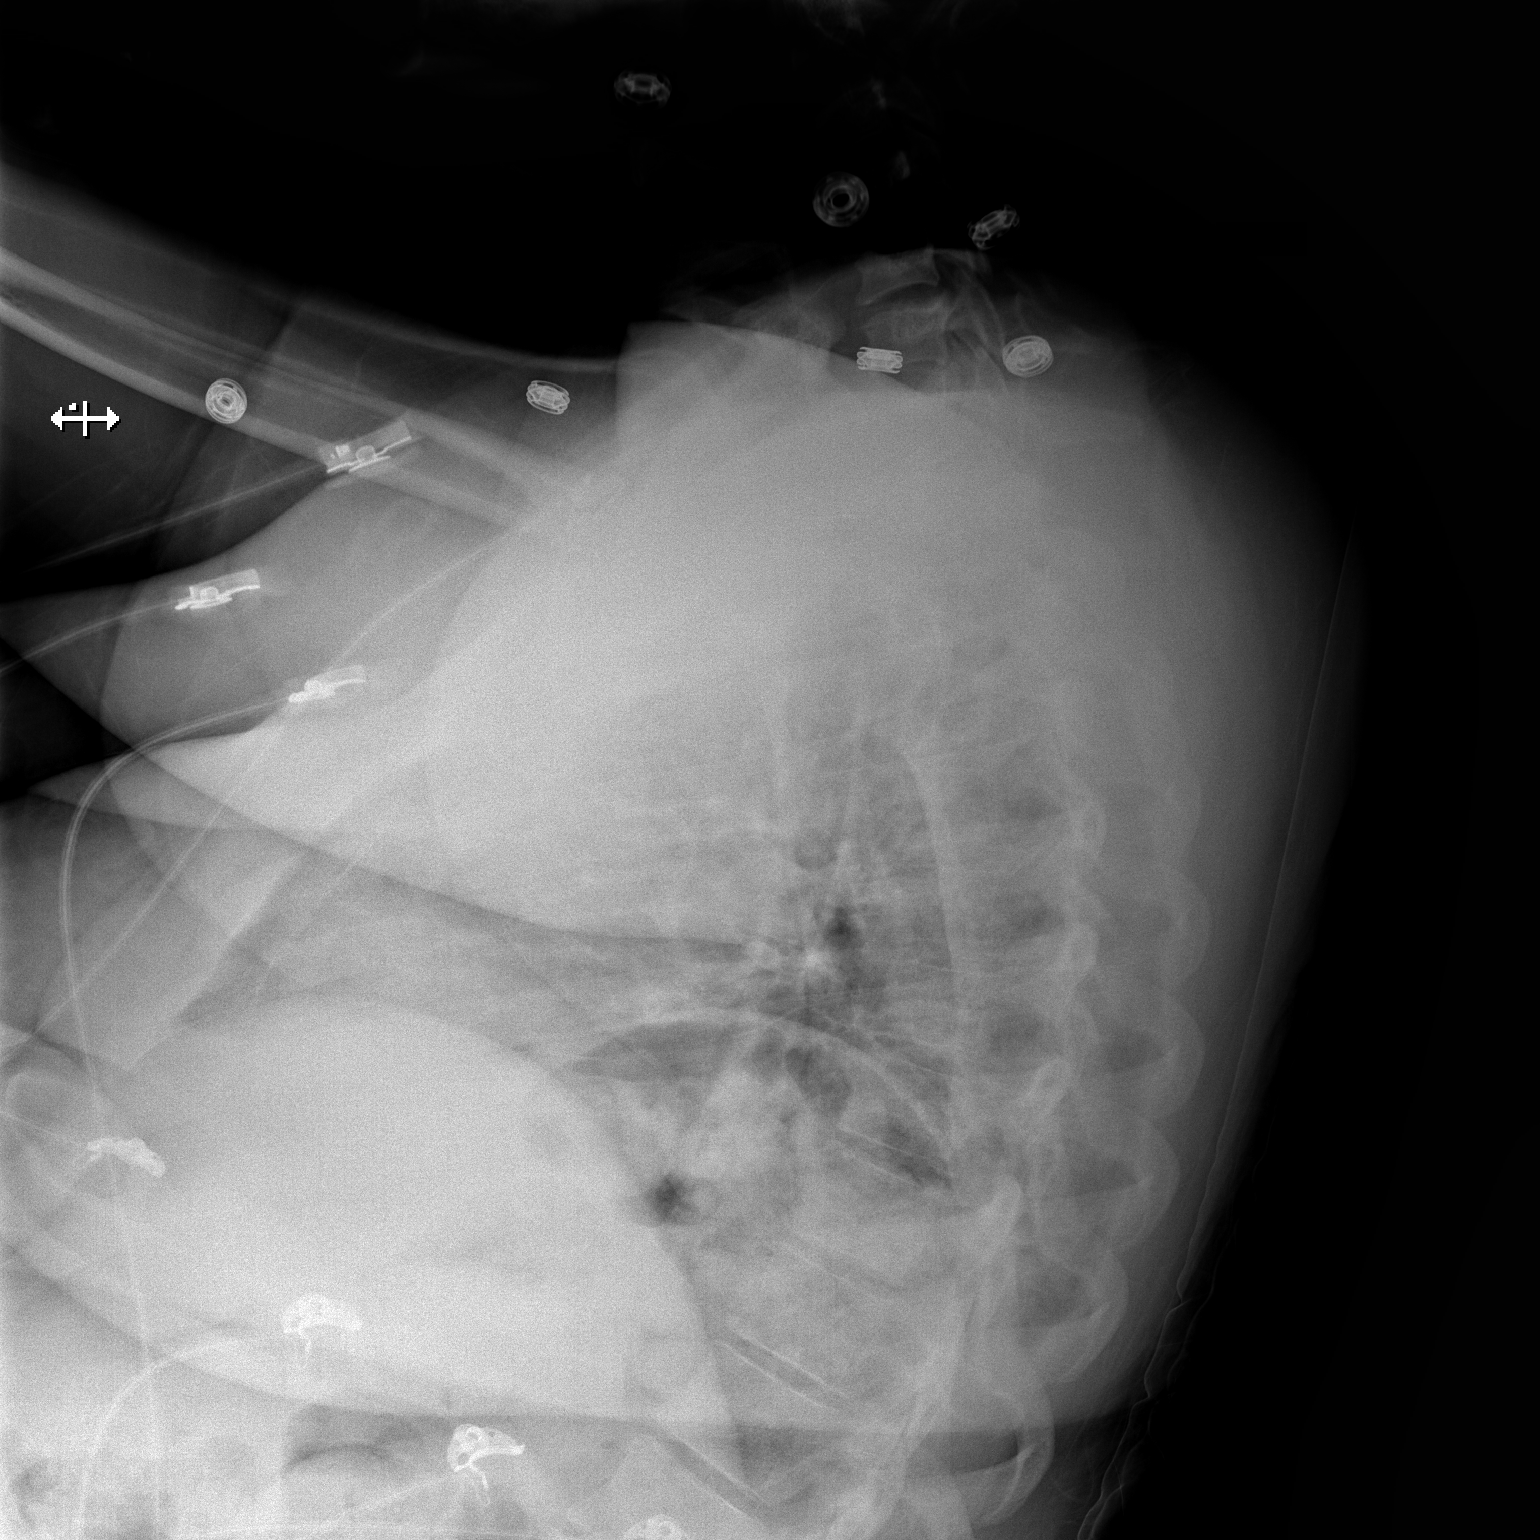

[x chest ap]
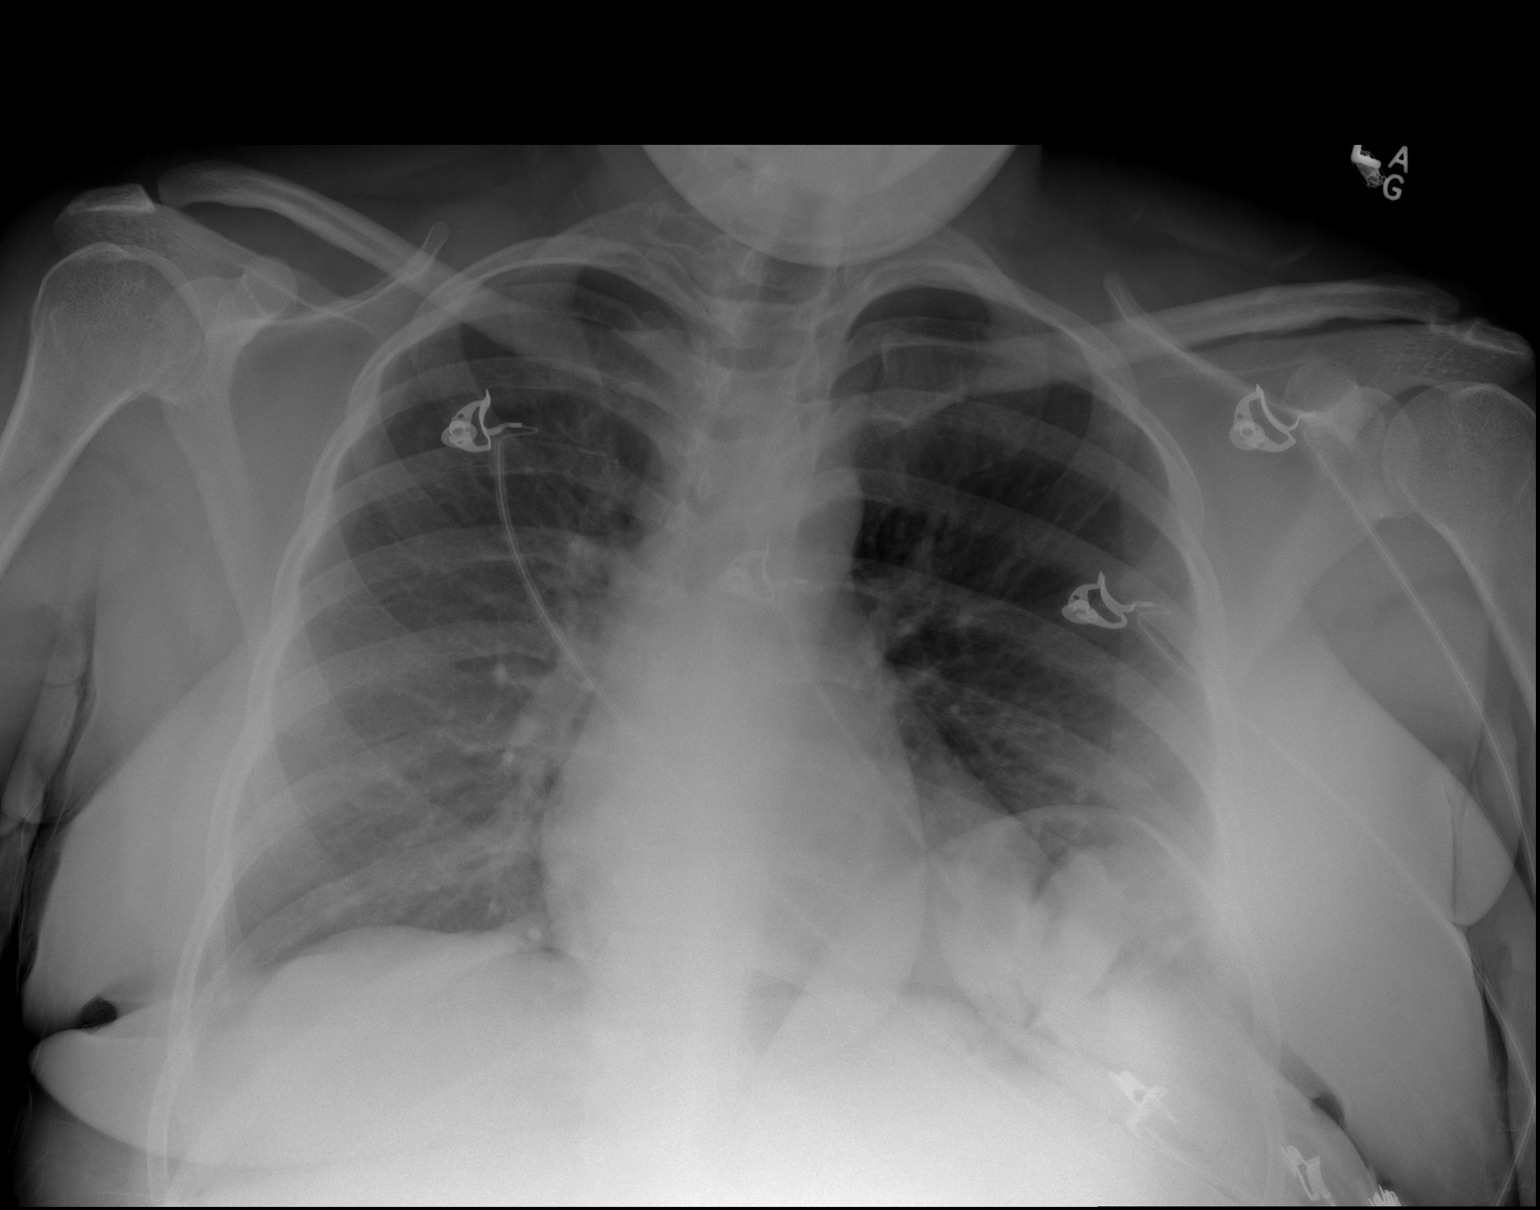

[2 of 2 positions shown; findings below may reference images not displayed]

FINDINGS: Unchanged elevation of left hemidiaphragm. Minimal adjacent left
basilar atelectasis or scarring. The right lung is clear. The heart
size is normal. No pleural fluid or pneumothorax. No pulmonary
edema. No acute osseous abnormality.
IMPRESSION: Unchanged elevated left hemidiaphragm with adjacent atelectasis or
scarring. No acute abnormality.

## 2017-04-09 ENCOUNTER — Other Ambulatory Visit: Payer: Self-pay

## 2017-04-09 ENCOUNTER — Emergency Department (HOSPITAL_COMMUNITY)
Admission: EM | Admit: 2017-04-09 | Discharge: 2017-04-12 | Disposition: A | Discharge status: Other Acute Inpatient Hospital | Payer: Medicaid Other | Attending: Emergency Medicine | Admitting: Emergency Medicine

## 2017-04-09 ENCOUNTER — Encounter (HOSPITAL_COMMUNITY): Payer: Self-pay | Admitting: Emergency Medicine

## 2017-04-09 DIAGNOSIS — Z7984 Long term (current) use of oral hypoglycemic drugs: Secondary | ICD-10-CM | POA: Insufficient documentation

## 2017-04-09 DIAGNOSIS — F79 Unspecified intellectual disabilities: Secondary | ICD-10-CM | POA: Diagnosis not present

## 2017-04-09 DIAGNOSIS — E119 Type 2 diabetes mellitus without complications: Secondary | ICD-10-CM | POA: Diagnosis not present

## 2017-04-09 DIAGNOSIS — F209 Schizophrenia, unspecified: Secondary | ICD-10-CM | POA: Diagnosis not present

## 2017-04-09 DIAGNOSIS — R45851 Suicidal ideations: Secondary | ICD-10-CM | POA: Diagnosis not present

## 2017-04-09 DIAGNOSIS — Z720 Tobacco use: Secondary | ICD-10-CM

## 2017-04-09 DIAGNOSIS — D649 Anemia, unspecified: Secondary | ICD-10-CM | POA: Diagnosis not present

## 2017-04-09 DIAGNOSIS — R4587 Impulsiveness: Secondary | ICD-10-CM | POA: Diagnosis not present

## 2017-04-09 DIAGNOSIS — R4689 Other symptoms and signs involving appearance and behavior: Secondary | ICD-10-CM | POA: Diagnosis not present

## 2017-04-09 DIAGNOSIS — F259 Schizoaffective disorder, unspecified: Secondary | ICD-10-CM | POA: Diagnosis present

## 2017-04-09 DIAGNOSIS — Z046 Encounter for general psychiatric examination, requested by authority: Secondary | ICD-10-CM | POA: Diagnosis present

## 2017-04-09 DIAGNOSIS — R450 Nervousness: Secondary | ICD-10-CM | POA: Diagnosis not present

## 2017-04-09 DIAGNOSIS — Z008 Encounter for other general examination: Secondary | ICD-10-CM

## 2017-04-09 DIAGNOSIS — Z87891 Personal history of nicotine dependence: Secondary | ICD-10-CM | POA: Insufficient documentation

## 2017-04-09 DIAGNOSIS — E876 Hypokalemia: Secondary | ICD-10-CM | POA: Diagnosis not present

## 2017-04-09 DIAGNOSIS — F4325 Adjustment disorder with mixed disturbance of emotions and conduct: Secondary | ICD-10-CM | POA: Insufficient documentation

## 2017-04-09 DIAGNOSIS — R443 Hallucinations, unspecified: Secondary | ICD-10-CM | POA: Diagnosis not present

## 2017-04-09 DIAGNOSIS — F258 Other schizoaffective disorders: Secondary | ICD-10-CM

## 2017-04-09 LAB — COMPREHENSIVE METABOLIC PANEL
ALT: 12 U/L — ABNORMAL LOW (ref 14–54)
AST: 17 U/L (ref 15–41)
Albumin: 3.8 g/dL (ref 3.5–5.0)
Alkaline Phosphatase: 57 U/L (ref 38–126)
Anion gap: 8 (ref 5–15)
BUN: 14 mg/dL (ref 6–20)
CALCIUM: 9.1 mg/dL (ref 8.9–10.3)
CO2: 26 mmol/L (ref 22–32)
CREATININE: 0.74 mg/dL (ref 0.44–1.00)
Chloride: 105 mmol/L (ref 101–111)
Glucose, Bld: 130 mg/dL — ABNORMAL HIGH (ref 65–99)
Potassium: 3.2 mmol/L — ABNORMAL LOW (ref 3.5–5.1)
SODIUM: 139 mmol/L (ref 135–145)
Total Bilirubin: 0.3 mg/dL (ref 0.3–1.2)
Total Protein: 7 g/dL (ref 6.5–8.1)

## 2017-04-09 LAB — CBC
HCT: 31.9 % — ABNORMAL LOW (ref 36.0–46.0)
HEMOGLOBIN: 10.4 g/dL — AB (ref 12.0–15.0)
MCH: 29.5 pg (ref 26.0–34.0)
MCHC: 32.6 g/dL (ref 30.0–36.0)
MCV: 90.6 fL (ref 78.0–100.0)
PLATELETS: 263 10*3/uL (ref 150–400)
RBC: 3.52 MIL/uL — ABNORMAL LOW (ref 3.87–5.11)
RDW: 14 % (ref 11.5–15.5)
WBC: 6.3 10*3/uL (ref 4.0–10.5)

## 2017-04-09 LAB — SALICYLATE LEVEL

## 2017-04-09 LAB — RAPID URINE DRUG SCREEN, HOSP PERFORMED
Amphetamines: NOT DETECTED
Barbiturates: NOT DETECTED
Benzodiazepines: NOT DETECTED
Cocaine: NOT DETECTED
OPIATES: NOT DETECTED
TETRAHYDROCANNABINOL: NOT DETECTED

## 2017-04-09 LAB — DIFFERENTIAL
Basophils Absolute: 0 10*3/uL (ref 0.0–0.1)
Basophils Relative: 0 %
Eosinophils Absolute: 0.1 10*3/uL (ref 0.0–0.7)
Eosinophils Relative: 1 %
LYMPHS PCT: 38 %
Lymphs Abs: 2.2 10*3/uL (ref 0.7–4.0)
MONO ABS: 0.2 10*3/uL (ref 0.1–1.0)
MONOS PCT: 4 %
NEUTROS ABS: 3.3 10*3/uL (ref 1.7–7.7)
Neutrophils Relative %: 57 %

## 2017-04-09 LAB — ACETAMINOPHEN LEVEL: Acetaminophen (Tylenol), Serum: <10 ug/mL — ABNORMAL LOW (ref 10–30)

## 2017-04-09 LAB — ETHANOL

## 2017-04-09 LAB — I-STAT BETA HCG BLOOD, ED (MC, WL, AP ONLY): I-stat hCG, quantitative: <5 m[IU]/mL (ref <5)

## 2017-04-09 MED ORDER — CLONIDINE HCL 0.1 MG PO TABS
0.1000 mg | ORAL_TABLET | Freq: Every day | ORAL | Status: DC
  Administered 2017-04-11 – 2017-04-12 (×2): 0.1 mg via ORAL
  Filled 2017-04-09 (×2): qty 1

## 2017-04-09 MED ORDER — CLOZAPINE 100 MG PO TABS
800.0000 mg | ORAL_TABLET | Freq: Every day | ORAL | Status: DC
  Administered 2017-04-09: 800 mg via ORAL
  Filled 2017-04-09: qty 8

## 2017-04-09 MED ORDER — FOLIC ACID 1 MG PO TABS
1.0000 mg | ORAL_TABLET | Freq: Every day | ORAL | Status: DC
  Administered 2017-04-10 – 2017-04-12 (×3): 1 mg via ORAL
  Filled 2017-04-09 (×3): qty 1

## 2017-04-09 MED ORDER — LORAZEPAM 1 MG PO TABS: 2.0000 mg | ORAL_TABLET | Freq: Two times a day (BID) | ORAL | Status: DC

## 2017-04-09 MED ORDER — ONDANSETRON HCL 4 MG PO TABS
4.0000 mg | ORAL_TABLET | Freq: Three times a day (TID) | ORAL | Status: DC | PRN
Start: 2017-04-09 — End: 2017-04-12

## 2017-04-09 MED ORDER — SENNOSIDES-DOCUSATE SODIUM 8.6-50 MG PO TABS
1.0000 | ORAL_TABLET | Freq: Two times a day (BID) | ORAL | Status: DC
  Administered 2017-04-10 – 2017-04-12 (×5): 1 via ORAL
  Filled 2017-04-09 (×5): qty 1

## 2017-04-09 MED ORDER — LISINOPRIL-HYDROCHLOROTHIAZIDE 10-12.5 MG PO TABS: 1.0000 | ORAL_TABLET | Freq: Every day | ORAL | Status: DC

## 2017-04-09 MED ORDER — DIVALPROEX SODIUM ER 250 MG PO TB24: 250.0000 mg | ORAL_TABLET | Freq: Two times a day (BID) | ORAL | Status: DC

## 2017-04-09 MED ORDER — DIVALPROEX SODIUM ER 500 MG PO TB24
1000.0000 mg | ORAL_TABLET | Freq: Two times a day (BID) | ORAL | Status: DC
  Administered 2017-04-10 – 2017-04-12 (×5): 1000 mg via ORAL
  Filled 2017-04-09 (×5): qty 2

## 2017-04-09 MED ORDER — LISINOPRIL 10 MG PO TABS
10.0000 mg | ORAL_TABLET | Freq: Every day | ORAL | Status: DC
  Administered 2017-04-11 – 2017-04-12 (×2): 10 mg via ORAL
  Filled 2017-04-09 (×3): qty 1

## 2017-04-09 MED ORDER — NICOTINE 21 MG/24HR TD PT24
21.0000 mg | MEDICATED_PATCH | Freq: Every day | TRANSDERMAL | Status: DC
  Administered 2017-04-10 – 2017-04-11 (×2): 21 mg via TRANSDERMAL
  Filled 2017-04-09 (×2): qty 1

## 2017-04-09 MED ORDER — BENZTROPINE MESYLATE 1 MG PO TABS
1.0000 mg | ORAL_TABLET | Freq: Two times a day (BID) | ORAL | Status: DC
  Administered 2017-04-10 – 2017-04-12 (×5): 1 mg via ORAL
  Filled 2017-04-09 (×5): qty 1

## 2017-04-09 MED ORDER — METFORMIN HCL ER 750 MG PO TB24
750.0000 mg | ORAL_TABLET | Freq: Every day | ORAL | Status: DC
  Administered 2017-04-10 – 2017-04-12 (×3): 750 mg via ORAL
  Filled 2017-04-09 (×3): qty 1

## 2017-04-09 MED ORDER — POTASSIUM CHLORIDE CRYS ER 20 MEQ PO TBCR
60.0000 meq | EXTENDED_RELEASE_TABLET | Freq: Once | ORAL | Status: AC
  Administered 2017-04-09: 60 meq via ORAL
  Filled 2017-04-09: qty 3

## 2017-04-09 MED ORDER — ALUM & MAG HYDROXIDE-SIMETH 200-200-20 MG/5ML PO SUSP: 30.0000 mL | Freq: Four times a day (QID) | ORAL | Status: DC | PRN

## 2017-04-09 MED ORDER — ZOLPIDEM TARTRATE 10 MG PO TABS: 10.0000 mg | ORAL_TABLET | Freq: Every evening | ORAL | Status: DC | PRN

## 2017-04-09 MED ORDER — HYDROCHLOROTHIAZIDE 12.5 MG PO CAPS
12.5000 mg | ORAL_CAPSULE | Freq: Every day | ORAL | Status: DC
  Administered 2017-04-11 – 2017-04-12 (×2): 12.5 mg via ORAL
  Filled 2017-04-09 (×3): qty 1

## 2017-04-09 MED ORDER — HYDROXYZINE PAMOATE 25 MG PO CAPS: 25.0000 mg | ORAL_CAPSULE | Freq: Every day | ORAL | Status: DC

## 2017-04-09 MED ORDER — TRAZODONE HCL 100 MG PO TABS
100.0000 mg | ORAL_TABLET | Freq: Every day | ORAL | Status: DC
  Administered 2017-04-09 – 2017-04-12 (×3): 100 mg via ORAL
  Filled 2017-04-09 (×3): qty 1

## 2017-04-09 MED ORDER — HALOPERIDOL 5 MG PO TABS: 10.0000 mg | ORAL_TABLET | Freq: Two times a day (BID) | ORAL | Status: DC

## 2017-04-09 MED ORDER — ACETAMINOPHEN 325 MG PO TABS: 650.0000 mg | ORAL_TABLET | ORAL | Status: DC | PRN
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
## 2017-04-09 NOTE — BH Assessment (Signed)
[facility] Assessment Progress Note    Per Nira ConnJason Berry, NP pt is recommended for overnight observation for safety due to current SI with a plan. EDP Dahlia ClientHannah, [place] and the pt's nurse Hardie LoraLilibeth, RN has been advised of disposition.  Princess BruinsAquicha Howard Bunte, MSW, LCSW Therapeutic Triage Specialist  [phone]
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
## 2017-04-10 DIAGNOSIS — Z87891 Personal history of nicotine dependence: Secondary | ICD-10-CM

## 2017-04-10 DIAGNOSIS — R443 Hallucinations, unspecified: Secondary | ICD-10-CM

## 2017-04-10 DIAGNOSIS — R450 Nervousness: Secondary | ICD-10-CM

## 2017-04-10 DIAGNOSIS — F258 Other schizoaffective disorders: Secondary | ICD-10-CM | POA: Diagnosis not present

## 2017-04-10 DIAGNOSIS — R45851 Suicidal ideations: Secondary | ICD-10-CM | POA: Diagnosis not present

## 2017-04-10 DIAGNOSIS — F419 Anxiety disorder, unspecified: Secondary | ICD-10-CM | POA: Diagnosis not present

## 2017-04-10 LAB — CBG MONITORING, ED: GLUCOSE-CAPILLARY: 130 mg/dL — AB (ref 65–99)

## 2017-04-10 MED ORDER — METFORMIN HCL 500 MG PO TABS
ORAL_TABLET | ORAL | Status: AC
  Filled 2017-04-10: qty 2

## 2017-04-10 MED ORDER — LORAZEPAM 1 MG PO TABS
1.0000 mg | ORAL_TABLET | Freq: Three times a day (TID) | ORAL | Status: DC
  Administered 2017-04-10 (×2): 1 mg via ORAL
  Filled 2017-04-10 (×2): qty 1

## 2017-04-10 MED ORDER — CLOZAPINE 100 MG PO TABS
400.0000 mg | ORAL_TABLET | Freq: Two times a day (BID) | ORAL | Status: DC
  Administered 2017-04-10 – 2017-04-12 (×5): 400 mg via ORAL
  Filled 2017-04-10 (×7): qty 4

## 2017-04-10 NOTE — ED Notes (Addendum)
Dr. Akintayo at bedside speaking with patient.  
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
## 2017-04-10 NOTE — ED Notes (Signed)
Pt states that she wants to go to [facility] because she can draw and color and do music there.

## 2017-04-10 NOTE — ED Notes (Signed)
Pt has been calm and cooperative. Currently sleeping.

## 2017-04-10 NOTE — ED Notes (Signed)
Report given to Diane, RN. Care transferred at this time. Patient moved to room 41, transported by security.

## 2017-04-10 NOTE — BH Assessment (Addendum)
BHH Assessment Progress Note  Per Thedore MinsMojeed Akintayo, MD, this pt requires psychiatric hospitalization at this time.  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  This Clinical research associatewriter reviewed scanned documents in Media, and was unable to find psychometric testing.  I then called the [facility] and spoke to OllaHarold to see if they had any record of the pt.  He reports that [facility]'s [facility].  I then called [facility], and was referred to pt's care coordinator, Alben SpittleMorgan Jones [phone]).  She reports that they have psychometric testing, completed in 2015, which she agrees to fax to me at [phone].  She also reports that pt has a guardian, [name] Fears at [facility] [phone]).  She does not, however, have the letter of guardianship.  I then called [name] Fears to notify her of pt's disposition.  She agrees to fax letter of guardianship to me at the number listed above, and will also send the psychometric testing, if she has it in her record.  These documents have since been received from her.  [facility], however, today they do not have any beds available on their IDD unit.  I have called back to McCookErica and notified her that [facility] while I pursue placement, and that in the meantime we will be working to stabilize her with a view toward discharging her if we do not find placement for her first.  She understands this.  Pt is currently under voluntary status, and [name] reports that if consents for treatment are needed she may be reached at the phone number listed above.  As an alternative, [facility]' 24 hour crisis line may be reached at [phone].  Doylene Canninghomas Patsey Pitstick, MA Triage Specialist [phone]

## 2017-04-10 NOTE — ED Notes (Signed)
[phone] Memos- Alex Young contact information. Trinna Postlex is the patient's caregiver at the group home she is from.

## 2017-04-10 NOTE — ED Notes (Signed)
Visitor now at bedside with patient.

## 2017-04-10 NOTE — ED Notes (Signed)
Patient denies SI/HI/AVH at this time. Plan of care discussed. Encouragement and support provided and safety maintain. Q 15 min safety checks remain in place and video monitoring. 

## 2017-04-10 NOTE — Progress Notes (Signed)
04/10/17 1400:  LRT introduced self to pt and offered activities.  Pt was sitting up in bed watching television.  Pt and LRT played [employer] and checkers.  Pt was pleasant, engaged and social throughout.     Caroll RancherMarjette Gladys Deckard, LRT/CTRS

## 2017-04-10 NOTE — ED Notes (Signed)
[facility] Expected date:  Expected time:  Means of arrival:  Comments: Hold for room 30

## 2017-04-11 MED ORDER — LORAZEPAM 1 MG PO TABS: 1.0000 mg | ORAL_TABLET | Freq: Two times a day (BID) | ORAL | Status: DC | PRN

## 2017-04-11 NOTE — ED Notes (Signed)
Pt complaint with medication regimen. Pt requiring frequent redirection, pt continues to report she wants to be transferred to an inpatient hospital.Pt denies SI/HI. Encouragement and support provided. Special checks q 15 mins in place for safety, video monitoring in place. Will continue to monitor.

## 2017-04-11 NOTE — Consult Note (Signed)
[facility] Psych ED Progress Note  04/11/2017 10:52 AM Brittany Velez  MRN:  [id number] Subjective: ''I am still hearing voices telling me to hurt myself.'' Objective: Patient was seen in her room, interviewed, chart reviewed and case discussed with treatment team.  Patient reports that she jumped out of  a moving car before she was brought to the emergency room because she was hearing voices telling her jump and kill herself. Patient has been acting bizarre, fidgety, talkative, demanding, irritable and thinks that people are out to get her.   Principal Problem: Schizophrenia, schizoaffective, chronic with acute exacerbation ([place]) Diagnosis:   Patient Active Problem List   Diagnosis Date Noted  . Schizophrenia, schizoaffective, chronic with acute exacerbation ([place]) [F25.8] 06/06/2014    Priority: High  . Adjustment disorder with mixed disturbance of emotions and conduct [F43.25] 07/08/2015  . Auditory hallucination [R44.0]   . Visual hallucination [R44.1]    Total Time spent with patient: 30 minutes  Past Psychiatric History: as above  Past Medical History:  Past Medical History:  Diagnosis Date  . Diabetes mellitus without complication ([place])   . Retardation   . Schizo-affective schizophrenia ([place])    No past surgical history on file. Family History: No family history on file. Family Psychiatric  History:  Social History:  Social History   Substance and Sexual Activity  Alcohol Use No     Social History   Substance and Sexual Activity  Drug Use No    Social History   Socioeconomic History  . Marital status: Single    Spouse name: None  . Number of children: None  . Years of education: None  . Highest education level: None  Social Needs  . Financial resource strain: None  . Food insecurity - worry: None  . Food insecurity - inability: None  . Transportation needs - medical: None  . Transportation needs - non-medical: None  Occupational History  . None  Tobacco Use  .  Smoking status: Former Smoker    Types: Cigarettes  . Smokeless tobacco: Never Used  Substance and Sexual Activity  . Alcohol use: No  . Drug use: No  . Sexual activity: Yes  Other Topics Concern  . None  Social History Narrative  . None    Sleep: Fair  Appetite:  Fair  Current Medications: Current Facility-Administered Medications  Medication Dose Route Frequency Provider Last Rate Last Dose  . acetaminophen (TYLENOL) tablet 650 mg  650 mg Oral Q4H PRN Street, [place], [place]      . alum & mag hydroxide-simeth (MAALOX/MYLANTA) 200-200-20 MG/5ML suspension 30 mL  30 mL Oral Q6H PRN Street, [place], [place]      . benztropine (COGENTIN) tablet 1 mg  1 mg Oral BID Street, [place], [place]   1 mg at 04/11/17 0954  . cloNIDine (CATAPRES) tablet 0.1 mg  0.1 mg Oral Daily Street, [place], [place]   0.1 mg at 04/11/17 0954  . cloZAPine (CLOZARIL) tablet 400 mg  400 mg Oral BID Corena Pilgrim, MD   400 mg at 04/11/17 0953  . divalproex (DEPAKOTE ER) 24 hr tablet 1,000 mg  1,000 mg Oral BID Street, [place], [place]   1,000 mg at 04/11/17 6712  . folic acid (FOLVITE) tablet 1 mg  1 mg Oral Daily [address] [place], [place]   1 mg at 04/11/17 4580  . lisinopril (PRINIVIL,ZESTRIL) tablet 10 mg  10 mg Oral Daily Davonna Belling, MD   10 mg at 04/11/17 0954   And  . hydrochlorothiazide (MICROZIDE) capsule  12.5 mg  12.5 mg Oral Daily Davonna Belling, MD   12.5 mg at 04/11/17 0954  . LORazepam (ATIVAN) tablet 1 mg  1 mg Oral BID PRN Patrecia Pour, NP      . metFORMIN (GLUCOPHAGE-XR) 24 hr tablet 750 mg  750 mg Oral Q breakfast [address] [place], [place]   750 mg at 04/11/17 7035  . nicotine (NICODERM CQ - dosed in mg/24 hours) patch 21 mg  21 mg Transdermal Daily [address] [place], [place]   21 mg at 04/11/17 0956  . ondansetron (ZOFRAN) tablet 4 mg  4 mg Oral Q8H PRN Street, [place], [place]      . senna-docusate (Senokot-S) tablet 1 tablet  1 tablet Oral BID Street, [place], [place]   1 tablet at 04/11/17 0954  .  traZODone (DESYREL) tablet 100 mg  100 mg Oral QHS [address] [place], [place]   100 mg at 04/10/17 2116   Current Outpatient Medications  Medication Sig Dispense Refill  . benztropine (COGENTIN) 1 MG tablet Take 1 mg by mouth 2 (two) times daily.    . cloNIDine (CATAPRES) 0.1 MG tablet Take 0.1 mg by mouth daily.    . clozapine (CLOZARIL) 200 MG tablet Take 800 mg by mouth at bedtime.     . divalproex (DEPAKOTE ER) 250 MG 24 hr tablet Take 250 mg by mouth 2 (two) times daily. Take along with Depakote ER 500 mg Tablet for total dose of 1250 mg    . divalproex (DEPAKOTE ER) 500 MG 24 hr tablet Take 1,000 mg by mouth 2 (two) times daily. Take along with Depakote ER 250 mg Tablet for total dose of 0093 mg    . folic acid (FOLVITE) 1 MG tablet Take 1 mg by mouth daily.    . haloperidol (HALDOL) 10 MG tablet Take 10-20 mg by mouth 2 (two) times daily. 10 MG in AM and 20 MG at bedtime    . hydrOXYzine (VISTARIL) 25 MG capsule Take 25 mg by mouth at bedtime.    Marland Kitchen lisinopril-hydrochlorothiazide (PRINZIDE,ZESTORETIC) 10-12.5 MG tablet Take 1 tablet by mouth daily.    Marland Kitchen LORazepam (ATIVAN) 2 MG tablet Take 2 mg by mouth 2 (two) times daily.     . metFORMIN (GLUCOPHAGE-XR) 750 MG 24 hr tablet Take 750 mg by mouth daily with breakfast.    . senna-docusate (SENOKOT-S) 8.6-50 MG tablet Take 1 tablet by mouth 2 (two) times daily.    . traZODone (DESYREL) 100 MG tablet Take 100 mg by mouth at bedtime.    Marland Kitchen zolpidem (AMBIEN) 10 MG tablet Take 10 mg by mouth at bedtime as needed for sleep.      Lab Results:  Results for orders placed or performed during the hospital encounter of 04/09/17 (from the past 48 hour(s))  Rapid urine drug screen (hospital performed)     Status: None   Collection Time: 04/09/17  9:03 PM  Result Value Ref Range   Opiates NONE DETECTED NONE DETECTED   Cocaine NONE DETECTED NONE DETECTED   Benzodiazepines NONE DETECTED NONE DETECTED   Amphetamines NONE DETECTED NONE DETECTED    Tetrahydrocannabinol NONE DETECTED NONE DETECTED   Barbiturates NONE DETECTED NONE DETECTED    Comment:        DRUG SCREEN FOR MEDICAL PURPOSES ONLY.  IF CONFIRMATION IS NEEDED FOR ANY PURPOSE, NOTIFY LAB WITHIN 5 DAYS.        LOWEST DETECTABLE LIMITS FOR URINE DRUG SCREEN Drug Class       Cutoff (ng/mL) Amphetamine  1000 Barbiturate      200 Benzodiazepine   462 Tricyclics       703 Opiates          300 Cocaine          300 THC              50   Comprehensive metabolic panel     Status: Abnormal   Collection Time: 04/09/17  9:15 PM  Result Value Ref Range   Sodium 139 135 - 145 mmol/L   Potassium 3.2 (L) 3.5 - 5.1 mmol/L   Chloride 105 101 - 111 mmol/L   CO2 26 22 - 32 mmol/L   Glucose, Bld 130 (H) 65 - 99 mg/dL   BUN 14 6 - 20 mg/dL   Creatinine, Ser 0.74 0.44 - 1.00 mg/dL   Calcium 9.1 8.9 - 10.3 mg/dL   Total Protein 7.0 6.5 - 8.1 g/dL   Albumin 3.8 3.5 - 5.0 g/dL   AST 17 15 - 41 U/L   ALT 12 (L) 14 - 54 U/L   Alkaline Phosphatase 57 38 - 126 U/L   Total Bilirubin 0.3 0.3 - 1.2 mg/dL   GFR calc non Af Amer >60 >60 mL/min   GFR calc Af Amer >60 >60 mL/min    Comment: (NOTE) The eGFR has been calculated using the CKD EPI equation. This calculation has not been validated in all clinical situations. eGFR's persistently <60 mL/min signify possible Chronic Kidney Disease.    Anion gap 8 5 - 15  Ethanol     Status: None   Collection Time: 04/09/17  9:15 PM  Result Value Ref Range   Alcohol, Ethyl (B) <10 <10 mg/dL    Comment:        LOWEST DETECTABLE LIMIT FOR SERUM ALCOHOL IS 10 mg/dL FOR MEDICAL PURPOSES ONLY   Salicylate level     Status: None   Collection Time: 04/09/17  9:15 PM  Result Value Ref Range   Salicylate Lvl <5.0 2.8 - 30.0 mg/dL  Acetaminophen level     Status: Abnormal   Collection Time: 04/09/17  9:15 PM  Result Value Ref Range   Acetaminophen (Tylenol), Serum <10 (L) 10 - 30 ug/mL    Comment:        THERAPEUTIC CONCENTRATIONS  VARY SIGNIFICANTLY. A RANGE OF 10-30 ug/mL MAY BE AN EFFECTIVE CONCENTRATION FOR MANY PATIENTS. HOWEVER, SOME ARE BEST TREATED AT CONCENTRATIONS OUTSIDE THIS RANGE. ACETAMINOPHEN CONCENTRATIONS >150 ug/mL AT 4 HOURS AFTER INGESTION AND >50 ug/mL AT 12 HOURS AFTER INGESTION ARE OFTEN ASSOCIATED WITH TOXIC REACTIONS.   cbc     Status: Abnormal   Collection Time: 04/09/17  9:15 PM  Result Value Ref Range   WBC 6.3 4.0 - 10.5 K/uL   RBC 3.52 (L) 3.87 - 5.11 MIL/uL   Hemoglobin 10.4 (L) 12.0 - 15.0 g/dL   HCT 31.9 (L) 36.0 - 46.0 %   MCV 90.6 78.0 - 100.0 fL   MCH 29.5 26.0 - 34.0 pg   MCHC 32.6 30.0 - 36.0 g/dL   RDW 14.0 11.5 - 15.5 %   Platelets 263 150 - 400 K/uL  Differential     Status: None   Collection Time: 04/09/17  9:15 PM  Result Value Ref Range   Neutrophils Relative % 57 %   Neutro Abs 3.3 1.7 - 7.7 K/uL   Lymphocytes Relative 38 %   Lymphs Abs 2.2 0.7 - 4.0 K/uL   Monocytes Relative 4 %  Monocytes Absolute 0.2 0.1 - 1.0 K/uL   Eosinophils Relative 1 %   Eosinophils Absolute 0.1 0.0 - 0.7 K/uL   Basophils Relative 0 %   Basophils Absolute 0.0 0.0 - 0.1 K/uL  I-Stat beta hCG blood, ED     Status: None   Collection Time: 04/09/17  9:29 PM  Result Value Ref Range   I-stat hCG, quantitative <5.0 <5 mIU/mL   Comment 3            Comment:   GEST. AGE      CONC.  (mIU/mL)   <=1 WEEK        5 - 50     2 WEEKS       50 - 500     3 WEEKS       100 - 10,000     4 WEEKS     1,000 - 30,000        FEMALE AND NON-PREGNANT FEMALE:     LESS THAN 5 mIU/mL   CBG monitoring, ED     Status: Abnormal   Collection Time: 04/10/17  8:42 AM  Result Value Ref Range   Glucose-Capillary 130 (H) 65 - 99 mg/dL    Blood Alcohol level:  Lab Results  Component Value Date   ETH <10 04/09/2017   ETH <5 04/21/2016    Physical Findings: AIMS:  , ,  ,  ,    CIWA:    COWS:     Musculoskeletal: Strength & Muscle Tone: within normal limits Gait & Station: normal Patient leans:  N/A  Psychiatric Specialty Exam: Physical Exam  Psychiatric: Her mood appears anxious. Her speech is rapid and/or pressured and tangential. She is actively hallucinating. Cognition and memory are normal. She expresses impulsivity. She expresses suicidal ideation.    Review of Systems  Constitutional: Negative.   HENT: Negative.   Eyes: Negative.   Respiratory: Negative.   Cardiovascular: Negative.   Gastrointestinal: Negative.   Genitourinary: Negative.   Musculoskeletal: Negative.   Skin: Negative.   Neurological: Negative.   Endo/Heme/Allergies: Negative.   Psychiatric/Behavioral: Positive for hallucinations and suicidal ideas. The patient is nervous/anxious.     Blood pressure 112/63, pulse 92, temperature 98 F (36.7 C), temperature source Oral, resp. rate 18, SpO2 98 %.There is no height or weight on file to calculate BMI.  General Appearance: Casual  Eye Contact:  Fair  Speech:  Garbled and Pressured  Volume:  Decreased  Mood:  Dysphoric and Irritable  Affect:  Blunt  Thought Process:  Disorganized  Orientation:  Full (Time, Place, and Person)  Thought Content:  Hallucinations: Auditory and Paranoid Ideation  Suicidal Thoughts:  Yes.  without intent/plan  Homicidal Thoughts:  No  Memory:  Immediate;   Fair Recent;   Fair Remote;   Fair  Judgement:  Poor  Insight:  Shallow  Psychomotor Activity:  Increased  Concentration:  Concentration: Fair and Attention Span: Fair  Recall:  [employer] of Knowledge:  Fair  Language:  Fair  Akathisia:  No  Handed:  Right  AIMS (if indicated):     Assets:  Armed forces logistics/support/administrative officer Social Support  ADL's: marginal  Cognition:  WNL  Sleep:   fair      Treatment Plan Summary: Daily contact with patient to assess and evaluate symptoms and progress in treatment and Medication management Continue Depakote, Clozapine, Benztropine and Trazodone  Disposition: Recommend psychiatric Inpatient admission when medically  cleared.    Corena Pilgrim, MD 04/11/2017, 10:52 AM

## 2017-04-11 NOTE — ED Notes (Signed)
Pt in dayroom with RT.

## 2017-04-11 NOTE — BH Assessment (Signed)
[facility]HH Assessment Progress Note  Per Thedore MinsMojeed Akintayo, MD, this pt continues to require psychiatric hospitalization.  This Clinical research associatewriter called [facility] and spoke to Livingston WheelerJonathan, who reports that beds are currently available on their IDD unit.  Referral information has been faxed, and Christiane HaJonathan has acknowledged receipt.  Decision is pending as of this writing.  At 14:55 I called pt's guardian, [name] Fears, to update her on disposition.  Doylene Canninghomas Jameshia Hayashida, [place] Behavioral Health Coordinator [phone]

## 2017-04-11 NOTE — ED Notes (Signed)
Patient refused all her bedtime meds. Patient present with elective mutism. Education on med compliance not effective.

## 2017-04-11 NOTE — Progress Notes (Signed)
04/11/17 1402:  LRT offered activities to pt.  Pt came down to dayroom and played [place]enga with LRT.  Pt was talking to herself .  Pt seemed tearful at one point.  After Dione PloverJenga, pt colored a picture.  Pt was pleasant but unable to continuously focus on the activities without redirection.     Caroll RancherMarjette Terrian Sentell, LRT/CTRS

## 2017-04-11 NOTE — ED Notes (Addendum)
Patient seen awake. Appear to be responding to internal stimuli. Patient stated "some people here are bothering my life". Patient stares and glances the room suspiciously and start talking to her self. Patient ignored this Clinical research associatewriter and continues to talk to self. Will continue to monitor patient. Safety maintained at all times.

## 2017-04-12 DIAGNOSIS — F17210 Nicotine dependence, cigarettes, uncomplicated: Secondary | ICD-10-CM | POA: Diagnosis not present

## 2017-04-12 DIAGNOSIS — R450 Nervousness: Secondary | ICD-10-CM | POA: Diagnosis not present

## 2017-04-12 DIAGNOSIS — R443 Hallucinations, unspecified: Secondary | ICD-10-CM | POA: Diagnosis not present

## 2017-04-12 DIAGNOSIS — R4587 Impulsiveness: Secondary | ICD-10-CM | POA: Diagnosis not present

## 2017-04-12 DIAGNOSIS — F258 Other schizoaffective disorders: Secondary | ICD-10-CM | POA: Diagnosis not present

## 2017-04-12 DIAGNOSIS — F259 Schizoaffective disorder, unspecified: Secondary | ICD-10-CM | POA: Insufficient documentation

## 2017-04-12 NOTE — Progress Notes (Signed)
04/12/17 1404:  LRT went to pt room, pt was sleep.   Caroll RancherMarjette Dontavious Emily, LRT/CTRS

## 2017-04-12 NOTE — Consult Note (Signed)
[facility]  04/12/2017 12:38 PM Alfonzo FellerLinda Franson  MRN:  [id number] Subjective: Hallucinations are improving but still continue Objective: Patient continues to be difficult to redirect with complaints of hallucinations and thoughts to hurt herself.  She does not want to return her group home, not sure how much her symptoms are sincere and how much are exaggerated due to her frequent statements of not wanting to return to her group home.  Principal Problem: Schizophrenia, schizoaffective, chronic with acute exacerbation (HCC) Diagnosis:   Patient Active Problem List   Diagnosis Date Noted  . Adjustment disorder with mixed disturbance of emotions and conduct [F43.25] 07/08/2015    Priority: High  . Schizophrenia, schizoaffective, chronic with acute exacerbation (HCC) [F25.8] 06/06/2014  . Auditory hallucination [R44.0]   . Visual hallucination [R44.1]    Total Time spent with patient: 30 minutes  Past Psychiatric History: as above  Past Medical History:  Past Medical History:  Diagnosis Date  . Diabetes mellitus without complication (HCC)   . Retardation   . Schizo-affective schizophrenia (HCC)    No past surgical history on file. Family History: No family history on file. Family Psychiatric  History:  Social History:  Social History   Substance and Sexual Activity  Alcohol Use No     Social History   Substance and Sexual Activity  Drug Use No    Social History   Socioeconomic History  . Marital status: Single    Spouse name: None  . Number of children: None  . Years of education: None  . Highest education level: None  Social Needs  . Financial resource strain: None  . Food insecurity - worry: None  . Food insecurity - inability: None  . Transportation needs - medical: None  . Transportation needs - non-medical: None  Occupational History  . None  Tobacco Use  . Smoking status: Former Smoker    Types: Cigarettes  . Smokeless tobacco: Never Used   Substance and Sexual Activity  . Alcohol use: No  . Drug use: No  . Sexual activity: Yes  Other Topics Concern  . None  Social History Narrative  . None    Sleep: Fair  Appetite:  Fair  Current Medications: Current Facility-Administered Medications  Medication Dose Route Frequency Provider Last Rate Last Dose  . acetaminophen (TYLENOL) tablet 650 mg  650 mg Oral Q4H PRN Street, [place], [place]      . alum & mag hydroxide-simeth (MAALOX/MYLANTA) 200-200-20 MG/5ML suspension 30 mL  30 mL Oral Q6H PRN Street, [place], [place]      . benztropine (COGENTIN) tablet 1 mg  1 mg Oral BID Street, [place], [place]   1 mg at 04/12/17 0932  . cloNIDine (CATAPRES) tablet 0.1 mg  0.1 mg Oral Daily Street, [place], [place]   0.1 mg at 04/12/17 0932  . cloZAPine (CLOZARIL) tablet 400 mg  400 mg Oral BID Thedore MinsAkintayo, Sumi Lye, MD   400 mg at 04/12/17 0933  . divalproex (DEPAKOTE ER) 24 hr tablet 1,000 mg  1,000 mg Oral BID Street, [place], [place]   1,000 mg at 04/12/17 0932  . folic acid (FOLVITE) tablet 1 mg  1 mg Oral Daily [address], [place], [place]   1 mg at 04/12/17 0932  . lisinopril (PRINIVIL,ZESTRIL) tablet 10 mg  10 mg Oral Daily Benjiman CorePickering, Nathan, MD   10 mg at 04/12/17 0932   And  . hydrochlorothiazide (MICROZIDE) capsule 12.5 mg  12.5 mg Oral Daily Benjiman CorePickering, Nathan, MD   12.5 mg at 04/12/17  0932  . LORazepam (ATIVAN) tablet 1 mg  1 mg Oral BID PRN Charm Rings, NP      . metFORMIN (GLUCOPHAGE-XR) 24 hr tablet 750 mg  750 mg Oral Q breakfast [address], [place], [place]   750 mg at 04/12/17 9528  . nicotine (NICODERM CQ - dosed in mg/24 hours) patch 21 mg  21 mg Transdermal Daily [address] [place], [place]   21 mg at 04/11/17 0956  . ondansetron (ZOFRAN) tablet 4 mg  4 mg Oral Q8H PRN Street, [place], [place]      . senna-docusate (Senokot-S) tablet 1 tablet  1 tablet Oral BID Street, [place], [place]   1 tablet at 04/12/17 0932  . traZODone (DESYREL) tablet 100 mg  100 mg Oral QHS [address] [place], [place]   100 mg at  04/12/17 0113   Current Outpatient Medications  Medication Sig Dispense Refill  . benztropine (COGENTIN) 1 MG tablet Take 1 mg by mouth 2 (two) times daily.    . cloNIDine (CATAPRES) 0.1 MG tablet Take 0.1 mg by mouth daily.    . clozapine (CLOZARIL) 200 MG tablet Take 800 mg by mouth at bedtime.     . divalproex (DEPAKOTE ER) 250 MG 24 hr tablet Take 250 mg by mouth 2 (two) times daily. Take along with Depakote ER 500 mg Tablet for total dose of 1250 mg    . divalproex (DEPAKOTE ER) 500 MG 24 hr tablet Take 1,000 mg by mouth 2 (two) times daily. Take along with Depakote ER 250 mg Tablet for total dose of 1250 mg    . folic acid (FOLVITE) 1 MG tablet Take 1 mg by mouth daily.    . haloperidol (HALDOL) 10 MG tablet Take 10-20 mg by mouth 2 (two) times daily. 10 MG in AM and 20 MG at bedtime    . hydrOXYzine (VISTARIL) 25 MG capsule Take 25 mg by mouth at bedtime.    Marland Kitchen lisinopril-hydrochlorothiazide (PRINZIDE,ZESTORETIC) 10-12.5 MG tablet Take 1 tablet by mouth daily.    Marland Kitchen LORazepam (ATIVAN) 2 MG tablet Take 2 mg by mouth 2 (two) times daily.     . metFORMIN (GLUCOPHAGE-XR) 750 MG 24 hr tablet Take 750 mg by mouth daily with breakfast.    . senna-docusate (SENOKOT-S) 8.6-50 MG tablet Take 1 tablet by mouth 2 (two) times daily.    . traZODone (DESYREL) 100 MG tablet Take 100 mg by mouth at bedtime.    Marland Kitchen zolpidem (AMBIEN) 10 MG tablet Take 10 mg by mouth at bedtime as needed for sleep.      Lab Results:  No results found for this or any previous visit (from the past 48 hour(s)).  Blood Alcohol level:  Lab Results  Component Value Date   ETH <10 04/09/2017   ETH <5 04/21/2016     Musculoskeletal: Strength & Muscle Tone: within normal limits Gait & Station: normal Patient leans: N/A  Psychiatric Specialty Exam: Physical Exam  Constitutional: She is oriented to person, place, and time. She appears well-developed and well-nourished.  HENT:  Head: Normocephalic.  Neck: Normal range of  motion.  Respiratory: Effort normal.  Musculoskeletal: Normal range of motion.  Neurological: She is alert and oriented to person, place, and time.  Psychiatric: Her mood appears anxious. Her speech is rapid and/or pressured and tangential. She is actively hallucinating. Cognition and memory are normal. She expresses impulsivity. She expresses suicidal ideation.    Review of Systems  Constitutional: Negative.   HENT: Negative.   Eyes: Negative.  Respiratory: Negative.   Cardiovascular: Negative.   Gastrointestinal: Negative.   Genitourinary: Negative.   Musculoskeletal: Negative.   Skin: Negative.   Neurological: Negative.   Endo/Heme/Allergies: Negative.   Psychiatric/Behavioral: Positive for hallucinations and suicidal ideas. The patient is nervous/anxious.     Blood pressure 103/65, pulse (!) 112, temperature 98.2 F (36.8 C), temperature source Oral, resp. rate 18, SpO2 100 %.There is no height or weight on file to calculate BMI.  General Appearance: Casual  Eye Contact:  Fair  Speech:  Garbled and Pressured  Volume:  Decreased  Mood:  Dysphoric and Irritable  Affect:  Blunt  Thought Process:  Disorganized  Orientation:  Full (Time, Place, and Person)  Thought Content:  Hallucinations: Auditory and Paranoid Ideation  Suicidal Thoughts:  Yes.  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  ADL's: marginal  Cognition:  WNL  Sleep:   fair      Treatment Plan Summary: Daily contact with patient to assess and evaluate symptoms and progress in treatment and Medication management Schizoaffective disorder, bipolar  type: -Crisis stabilization -Medication management:  Continue medical medications along with Cogentin 1 mg BID for EPS, Clozaril 400 mg BID for psychosis, Depakote 1000 mg BID for mood stabilization, Trazodone 100 mg at bedtime for sleep and Ativan 1 mg BId PRN anxiety/agitation -Individual counseling  Disposition: Recommend psychiatric Inpatient admission when medically cleared.    Nanine MeansLORD, JAMISON, NP 04/12/2017, 12:38 PM  Patient seen face-to-face for psychiatric evaluation, chart reviewed and case discussed with the physician extender and developed treatment plan. Reviewed the information documented and agree with the treatment plan. Thedore MinsMojeed Delle Andrzejewski, MD

## 2017-04-12 NOTE — BH Assessment (Addendum)
[facility] Assessment Progress Note  Per Thedore MinsMojeed Akintayo, MD, this pt continues to require psychiatric hospitalization at this time.  Dr Jannifer FranklinAkintayo also finds that pt meets criteria for IVC, which he has initiated.  [place] [facility] [place] [facility], and at [employer]:29 Magistrate Bettymae Yott confirms receipt.  As of this writing, service of Findings and Custody Order is pending..  At 12:38 Christiane HaJonathan calls from [facility] to report that pt has been accepted to their facility by Dr Cleon DewMegan Metesdorf.  Nanine MeansJamison Lord, DNP concurs with this decision.  Pt's nurse, Carlisle BeersLuann, has been notified, and agrees to call report to [phone], and to ask for [place], [place].  [place] [facility].   Doylene Canninghomas Marcea Rojek, [place] Behavioral Health Coordinator [phone]   Addendum:  At 12:50 this writer called pt's guardian, [name] Fears, and notified her of disposition.  Doylene Canninghomas Kenleigh Toback, [place] Behavioral Health Coordinator [phone]

## 2017-04-12 NOTE — ED Notes (Signed)
Per order, [facility] [place] [facility]. Pt was calm, cooperative, and in no acute distress. [facility].

## 2017-04-12 NOTE — ED Notes (Signed)
Patient is mildly bizarre but is not exhibiting overt psychotic behaviors.  He is compliant with scheduled medications.  POC discussed.  Unsuccessful attempt to call report at this time.  Support offered and 15' checks cont for safety.

## 2017-05-19 ENCOUNTER — Emergency Department (HOSPITAL_COMMUNITY)
Admission: EM | Admit: 2017-05-19 | Discharge: 2017-05-20 | Disposition: A | Discharge status: Home / Self Care | Payer: Medicaid Other | Attending: Emergency Medicine | Admitting: Emergency Medicine

## 2017-05-19 ENCOUNTER — Encounter (HOSPITAL_COMMUNITY): Payer: Self-pay | Admitting: Nurse Practitioner

## 2017-05-19 DIAGNOSIS — D649 Anemia, unspecified: Secondary | ICD-10-CM | POA: Diagnosis not present

## 2017-05-19 DIAGNOSIS — Z87891 Personal history of nicotine dependence: Secondary | ICD-10-CM | POA: Insufficient documentation

## 2017-05-19 DIAGNOSIS — R45851 Suicidal ideations: Secondary | ICD-10-CM | POA: Diagnosis not present

## 2017-05-19 DIAGNOSIS — R451 Restlessness and agitation: Secondary | ICD-10-CM | POA: Insufficient documentation

## 2017-05-19 DIAGNOSIS — F919 Conduct disorder, unspecified: Secondary | ICD-10-CM | POA: Diagnosis not present

## 2017-05-19 DIAGNOSIS — R030 Elevated blood-pressure reading, without diagnosis of hypertension: Secondary | ICD-10-CM | POA: Diagnosis not present

## 2017-05-19 DIAGNOSIS — Z7984 Long term (current) use of oral hypoglycemic drugs: Secondary | ICD-10-CM | POA: Diagnosis not present

## 2017-05-19 DIAGNOSIS — E119 Type 2 diabetes mellitus without complications: Secondary | ICD-10-CM | POA: Diagnosis not present

## 2017-05-19 DIAGNOSIS — Z79899 Other long term (current) drug therapy: Secondary | ICD-10-CM | POA: Diagnosis not present

## 2017-05-19 LAB — COMPREHENSIVE METABOLIC PANEL
ALT: 10 U/L — ABNORMAL LOW (ref 14–54)
ANION GAP: 6 (ref 5–15)
AST: 18 U/L (ref 15–41)
Albumin: 3.4 g/dL — ABNORMAL LOW (ref 3.5–5.0)
Alkaline Phosphatase: 43 U/L (ref 38–126)
BUN: 13 mg/dL (ref 6–20)
CALCIUM: 8.4 mg/dL — AB (ref 8.9–10.3)
CHLORIDE: 109 mmol/L (ref 101–111)
CO2: 24 mmol/L (ref 22–32)
Creatinine, Ser: 0.72 mg/dL (ref 0.44–1.00)
Glucose, Bld: 129 mg/dL — ABNORMAL HIGH (ref 65–99)
Potassium: 3.5 mmol/L (ref 3.5–5.1)
SODIUM: 139 mmol/L (ref 135–145)
Total Bilirubin: 0.5 mg/dL (ref 0.3–1.2)
Total Protein: 6.8 g/dL (ref 6.5–8.1)

## 2017-05-19 LAB — RAPID URINE DRUG SCREEN, HOSP PERFORMED
Amphetamines: NOT DETECTED
Barbiturates: NOT DETECTED
Benzodiazepines: NOT DETECTED
COCAINE: NOT DETECTED
OPIATES: NOT DETECTED
TETRAHYDROCANNABINOL: NOT DETECTED

## 2017-05-19 LAB — CBC
HCT: 29.7 % — ABNORMAL LOW (ref 36.0–46.0)
HEMOGLOBIN: 9.5 g/dL — AB (ref 12.0–15.0)
MCH: 29.3 pg (ref 26.0–34.0)
MCHC: 32 g/dL (ref 30.0–36.0)
MCV: 91.7 fL (ref 78.0–100.0)
PLATELETS: 211 10*3/uL (ref 150–400)
RBC: 3.24 MIL/uL — AB (ref 3.87–5.11)
RDW: 14.6 % (ref 11.5–15.5)
WBC: 8.3 10*3/uL (ref 4.0–10.5)

## 2017-05-19 LAB — DIFFERENTIAL
Basophils Absolute: 0 10*3/uL (ref 0.0–0.1)
Basophils Relative: 0 %
EOS ABS: 0.2 10*3/uL (ref 0.0–0.7)
EOS PCT: 2 %
LYMPHS ABS: 2 10*3/uL (ref 0.7–4.0)
LYMPHS PCT: 25 %
Monocytes Absolute: 1.1 10*3/uL — ABNORMAL HIGH (ref 0.1–1.0)
Monocytes Relative: 13 %
Neutro Abs: 4.9 10*3/uL (ref 1.7–7.7)
Neutrophils Relative %: 60 %

## 2017-05-19 LAB — SALICYLATE LEVEL

## 2017-05-19 LAB — HCG, QUANTITATIVE, PREGNANCY: hCG, Beta Chain, Quant, S: <1 m[IU]/mL (ref <5)

## 2017-05-19 LAB — ETHANOL

## 2017-05-19 LAB — ACETAMINOPHEN LEVEL

## 2017-05-19 MED ORDER — TRAZODONE HCL 100 MG PO TABS
100.0000 mg | ORAL_TABLET | Freq: Every day | ORAL | Status: DC
  Administered 2017-05-20: 100 mg via ORAL
  Filled 2017-05-19: qty 1

## 2017-05-19 MED ORDER — CLOZAPINE 100 MG PO TABS: 200.0000 mg | ORAL_TABLET | Freq: Two times a day (BID) | ORAL | Status: DC

## 2017-05-19 MED ORDER — LORAZEPAM 1 MG PO TABS: 2.0000 mg | ORAL_TABLET | Freq: Two times a day (BID) | ORAL | Status: DC | PRN

## 2017-05-19 MED ORDER — BENZTROPINE MESYLATE 1 MG PO TABS
1.0000 mg | ORAL_TABLET | Freq: Two times a day (BID) | ORAL | Status: DC
  Administered 2017-05-20 (×2): 1 mg via ORAL
  Filled 2017-05-19 (×2): qty 1

## 2017-05-19 MED ORDER — METFORMIN HCL ER 750 MG PO TB24
750.0000 mg | ORAL_TABLET | Freq: Every day | ORAL | Status: DC
  Filled 2017-05-19: qty 1

## 2017-05-19 MED ORDER — ACETAMINOPHEN 325 MG PO TABS: 650.0000 mg | ORAL_TABLET | ORAL | Status: DC | PRN

## 2017-05-19 MED ORDER — HYDROXYZINE HCL 25 MG PO TABS
25.0000 mg | ORAL_TABLET | Freq: Every day | ORAL | Status: DC
  Administered 2017-05-20: 25 mg via ORAL
  Filled 2017-05-19: qty 1

## 2017-05-19 MED ORDER — FOLIC ACID 1 MG PO TABS
1.0000 mg | ORAL_TABLET | Freq: Every day | ORAL | Status: DC
  Administered 2017-05-20: 1 mg via ORAL
  Filled 2017-05-19: qty 1

## 2017-05-19 MED ORDER — SENNOSIDES-DOCUSATE SODIUM 8.6-50 MG PO TABS
1.0000 | ORAL_TABLET | Freq: Two times a day (BID) | ORAL | Status: DC
  Administered 2017-05-20 (×2): 1 via ORAL
  Filled 2017-05-19 (×2): qty 1

## 2017-05-19 MED ORDER — DIVALPROEX SODIUM ER 500 MG PO TB24
1000.0000 mg | ORAL_TABLET | Freq: Two times a day (BID) | ORAL | Status: DC
  Administered 2017-05-20 (×2): 1000 mg via ORAL
  Filled 2017-05-19 (×2): qty 2

## 2017-05-19 MED ORDER — HALOPERIDOL 5 MG PO TABS
10.0000 mg | ORAL_TABLET | Freq: Two times a day (BID) | ORAL | Status: DC
  Administered 2017-05-20 (×2): 10 mg via ORAL
  Filled 2017-05-19 (×2): qty 2

## 2017-05-19 NOTE — ED Notes (Signed)
Bed: WA27 Expected date:  Expected time:  Means of arrival:  Comments: 32 yo F/SI
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
## 2017-05-19 NOTE — BH Assessment (Signed)
[facility] Assessment Progress Note  Case discussed with Nira ConnJason Berry, NP who recommends overnight observation and re-evaluation by psych in the AM. EDP Elisha PonderMurray, Alyssa B, PA-C and pt's TCU nurse made aware of disposition.  Princess BruinsAquicha Arieona Swaggerty, MSW, LCSW Therapeutic Triage Specialist  [phone]

## 2017-05-19 NOTE — ED Triage Notes (Signed)
Pt states she is having suicidal thoughts and has a plan to cut her throat.

## 2017-05-20 NOTE — ED Notes (Signed)
Pt's ride came and Pt left without discharge instructions. Staff stated pt ambulated out of department without incident.

## 2017-05-20 NOTE — Discharge Instructions (Addendum)
We saw you in the ER for your mental health concerns and had our behavioral health team evaluate you. The team feels comfortable sending you home, please follow the recommendations given to you by them and the follow-up and medications they have prescribed to you. Please refrain from substance abuse. Return to the ER if your symptoms worsen.  Please see your primary care doctor for optimal evaluation of your blood pressure, which was noted to be high on occasion.

## 2017-05-20 NOTE — ED Notes (Signed)
Patient talking to herself, coming out of the room asking to speak with the dr. Directed patient back to the room and notified RN

## 2017-05-20 NOTE — Progress Notes (Addendum)
CSW spoke with French Anaracy from pt's group home and confirmed that someone would be picking pt up. CSW was made aware that pt "does this often". CSW has spoken with Misty StanleyStacey at [phone]) and informed her that someone was picking pt up today. RN has been updated at this time of plan.CS[facility] and left VM at this time. CSW has updated pt's LG Cicero Duck(Erika) that pt is returning to the Group Home at this time. CSW signing off as there are no further CSW needs.     Claude MangesKierra S. Breeonna Mone, MSW, LCSW-A Emergency Department Clinical Social Worker [phone]

## 2017-05-20 NOTE — BH Assessment (Addendum)
[facility]  Per Juanetta BeetsJacqueline Norman, DO, this pt does not require psychiatric hospitalization at this time.  [facility]  This Clinical research associatewriter will attempt to reach the CSW covering the ED to facilitate pt's return.  Pt's nurse, Donnal DebarRandi, has been notified.  Doylene Canninghomas Deyton Ellenbecker, MA Triage Specialist [phone]  Addendum:  Montel ClockKierra calls back from Social Work.  She agrees to work on facilitating pt's discharge.  Donnal DebarRandi has been notified.  Doylene Canninghomas Laniqua Torrens, [place] Behavioral Health Coordinator [phone]

## 2017-05-20 NOTE — Progress Notes (Signed)
CSW informed that pt's ride has not arrived to come and get pt just yet. CSW reached back out to Montesanoracy and confirmed that she has sent someone to get pt at this time. Pt to be picked up today.     Claude MangesKierra S. Dearra Myhand, MSW, LCSW-A Emergency Department Clinical Social Worker [phone]

## 2017-06-04 ENCOUNTER — Encounter (HOSPITAL_COMMUNITY): Payer: Self-pay | Admitting: Nurse Practitioner

## 2017-06-04 ENCOUNTER — Other Ambulatory Visit: Payer: Self-pay

## 2017-06-04 ENCOUNTER — Emergency Department (HOSPITAL_COMMUNITY)
Admission: EM | Admit: 2017-06-04 | Discharge: 2017-06-04 | Disposition: A | Discharge status: Home / Self Care | Payer: Medicaid Other | Attending: Emergency Medicine | Admitting: Emergency Medicine

## 2017-06-04 DIAGNOSIS — F250 Schizoaffective disorder, bipolar type: Secondary | ICD-10-CM | POA: Insufficient documentation

## 2017-06-04 DIAGNOSIS — F259 Schizoaffective disorder, unspecified: Secondary | ICD-10-CM

## 2017-06-04 DIAGNOSIS — Z79899 Other long term (current) drug therapy: Secondary | ICD-10-CM | POA: Insufficient documentation

## 2017-06-04 DIAGNOSIS — Z87891 Personal history of nicotine dependence: Secondary | ICD-10-CM | POA: Diagnosis not present

## 2017-06-04 DIAGNOSIS — E119 Type 2 diabetes mellitus without complications: Secondary | ICD-10-CM | POA: Diagnosis not present

## 2017-06-04 DIAGNOSIS — Z7984 Long term (current) use of oral hypoglycemic drugs: Secondary | ICD-10-CM | POA: Diagnosis not present

## 2017-06-04 DIAGNOSIS — F329 Major depressive disorder, single episode, unspecified: Secondary | ICD-10-CM | POA: Diagnosis present

## 2017-06-04 NOTE — ED Provider Notes (Signed)
[facility]-EMERGENCY DEPT Provider Note   CSN: [id number] Arrival date & time: 06/04/17  1100  LEVEL 5 CAVEAT - MENTAL RETARDATION   History   Chief Complaint Chief Complaint  Patient presents with  . Anxiety    HPI Brittany Velez is a 32 y.o. female.  HPI  32 year old female with a history of schizophrenia, diabetes, and mental retardation presents by EMS after running away from group home and expressing suicidal ideation.  The patient tells me that she is scared for her life at the group home and so she wanted to leave.  EMS reports that the patient frequently tries to run away.  She tells me she has been feeling suicidal for months and has a plan to slit her throat with a knife.  She is also concerned that her face looks different than normal and that she is not herself.  She states this is been an ongoing process for about a year.  Patient ran across [place] and was found at a business who called 911.  The patient otherwise denies feeling ill or complaints of pain.  Past Medical History:  Diagnosis Date  . Diabetes mellitus without complication (HCC)   . Retardation   . Schizo-affective schizophrenia [facility])     Patient Active Problem List   Diagnosis Date Noted  . 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 Schizophrenia, schizoaffective, chronic with acute exacerbation (HCC) 06/06/2014  . Auditory hallucination   . Visual hallucination     History reviewed. No pertinent surgical history.  OB History    No data available       Home Medications    Prior to Admission medications   Medication Sig Start Date End Date Taking? Authorizing Provider  benztropine (COGENTIN) 1 MG tablet Take 1 mg by mouth 2 (two) times daily.    [provider]  clozapine (CLOZARIL) 200 MG tablet Take 200-600 mg by mouth 2 (two) times daily. 200 mg every morning 600 mg every night    [provider]  divalproex  (DEPAKOTE ER) 500 MG 24 hr tablet Take 1,000 mg by mouth 2 (two) times daily.     [provider]  ergocalciferol (VITAMIN D2) 50000 units capsule Take 50,000 Units by mouth every Sunday.    [provider]  folic acid (FOLVITE) 1 MG tablet Take 1 mg by mouth daily.    [provider]  haloperidol (HALDOL) 10 MG tablet Take 10-20 mg by mouth 2 (two) times daily. 10 MG in AM and 20 MG at bedtime    [provider]  hydrOXYzine (VISTARIL) 25 MG capsule Take 25 mg by mouth at bedtime.    [provider]  LORazepam (ATIVAN) 2 MG tablet Take 2 mg by mouth 2 (two) times daily as needed for anxiety (agitation).     [provider]  metFORMIN (GLUCOPHAGE-XR) 750 MG 24 hr tablet Take 750 mg by mouth at bedtime.     [provider]  senna-docusate (SENOKOT-S) 8.6-50 MG tablet Take 1 tablet by mouth 2 (two) times daily.    [provider]  traZODone (DESYREL) 100 MG tablet Take 100 mg by mouth at bedtime.    [provider]    Family History History reviewed. No pertinent family history.  Social History Social History   Tobacco Use  . Smoking status: Former Smoker    Types: Cigarettes  . Smokeless tobacco: Never Used  Substance Use Topics  . Alcohol  use: No  . Drug use: No     Allergies   Patient has no known allergies.   Review of Systems Review of Systems  Unable to perform ROS: Psychiatric disorder     Physical Exam Updated Vital Signs BP 123/88 (BP Location: Right Arm)   Pulse 78   Temp 98.5 F (36.9 C) (Oral)   Resp 18   Ht 5\' 5"  (1.651 m)   Wt 88 kg (194 lb)   SpO2 99%   BMI 32.28 kg/m   Physical Exam  Constitutional: She appears well-developed and well-nourished. No distress.  HENT:  Head: Normocephalic and atraumatic.  Right Ear: External ear normal.  Left Ear: External ear normal.  Nose: Nose normal.  Eyes: Right eye exhibits no discharge. Left eye exhibits no discharge.    Cardiovascular: Normal rate, regular rhythm and normal heart sounds.  Pulmonary/Chest: Effort normal and breath sounds normal.  Abdominal: Soft. She exhibits no distension. There is no tenderness.  Neurological: She is alert.  Awake, alert, oriented to place and month. Does not know day or year  Skin: Skin is warm and dry. She is not diaphoretic.  Psychiatric: Her affect is blunt. Her speech is not rapid and/or pressured, not tangential and not slurred. She is not actively hallucinating. She expresses suicidal ideation. She expresses suicidal plans.  Nursing note and vitals reviewed.    ED Treatments / Results  Labs (all labs ordered are listed, but only abnormal results are displayed) Labs Reviewed - No data to display  EKG  EKG Interpretation None       Radiology No results found.  Procedures Procedures (including critical care time)  Medications Ordered in ED Medications - No data to display   Initial Impression / Assessment and Plan / ED Course  I have reviewed the triage vital signs and the nursing notes.  Pertinent labs & imaging results that were available during my care of the patient were reviewed by me and considered in my medical decision making (see chart for details).     The patient is resting comfortably here.  She is not actively hallucinating or in any distress.  She has had a long-standing history of running away from her group home.  Has had suicidal ideation many times before.  My suspicion is she is at low risk for harming herself.  I believe she is stable for discharge back to the group home.  TTS/psychiatry has evaluated patient and agrees.  Otherwise her vital signs are unremarkable and my suspicion for an acute medical emergency is low.  Final Clinical Impressions(s) / ED Diagnoses   Final diagnoses:  Schizo affective schizophrenia [facility](HCC)    ED Discharge Orders    None       Pricilla LovelessGoldston, Sherin Murdoch, MD 06/04/17 1231

## 2017-06-04 NOTE — ED Triage Notes (Signed)
[place] [address] [place] Patient [place] away from the group home. She was upset about lunch and ran across [place]. Patient is upset and angry and threatening to harm herself. Patient is schizophrenia.

## 2017-06-04 NOTE — ED Notes (Signed)
Bed: [id number] Expected date:  Expected time:  Means of arrival:  Comments: Ems suicidal

## 2017-06-04 NOTE — BH Assessment (Signed)
Assessment Note  Brittany FellerLinda Velez is a 32 y.o. female, in [facility] due to running away from her day program and saying she wanted to kill herself. Pt is a poor historian during TTS assessment and presents as distracted, more concerned about what time lunch will be, when she will go to the back part of the ED and if she is [facility]. Pt reports that she ran out of her day program b/c she "got tired of sitting in there.Marland Kitchen.got depressed and angry". Pt says that she ran to a police station and they called 911 and the ambulance came and got her. Pt reports saying that she wanted to kill herself. No current SI, HI, AVH.   Clinician called pt's guardian, [name] Fears [phone]), to inform her of pt's presentation to the ED and the recommendation for d/c back to her group home. [name] indicated that she will call Alex from pt's group home and have him come pick pt up.   Diagnosis: GAD  Past Medical History:  Past Medical History:  Diagnosis Date  . Diabetes mellitus without complication (HCC)   . Retardation   . Schizo-affective schizophrenia (HCC)     History reviewed. No pertinent surgical history.  Family History: History reviewed. No pertinent family history.  Social History:  reports that she has quit smoking. Her smoking use included cigarettes. she has never used smokeless tobacco. She reports that she does not drink alcohol or use drugs.  Additional Social History:  Alcohol / Drug Use Pain Medications: See MAR Prescriptions: See MAR Over the Counter: See MAR History of alcohol / drug use?: No history of alcohol / drug abuse  CIWA: CIWA-Ar BP: 112/73 Pulse Rate: (!) 101 COWS:    Allergies: No Known Allergies  Home Medications:  (Not in a hospital admission)  OB/GYN Status:  No LMP recorded.  General Assessment Data [facility] of Assessment: [facility] TTS Assessment: In system Is this a Tele or Face-to-Face Assessment?: Face-to-Face Is this an Initial Assessment or  a Re-assessment for this encounter?: Initial Assessment Marital status: Single Is patient pregnant?: No Pregnancy Status: No Living Arrangements: Group Home([facility]) Can pt return to current living arrangement?: Yes Admission Status: Voluntary Is patient capable of signing voluntary admission?: No Referral Source: Self/Family/Friend Insurance type: Medicaid     Crisis Care Plan Living Arrangements: Group Home([facility]) Name of Psychiatrist: pt denies Name of Therapist: pt denies   Education Status Is patient currently in school?: No Highest grade of school patient has completed: 12th  Risk to self with the past 6 months Suicidal Ideation: No-Not Currently/Within Last 6 Months Has patient been a risk to self within the past 6 months prior to admission? : No Suicidal Intent: No Has patient had any suicidal intent within the past 6 months prior to admission? : No Is patient at risk for suicide?: No Suicidal Plan?: No Has patient had any suicidal plan within the past 6 months prior to admission? : No Access to Means: No Previous Attempts/Gestures: No Intentional Self Injurious Behavior: None Family Suicide History: No Recent stressful life event(s): Other (Comment) Persecutory voices/beliefs?: No Depression: Yes Depression Symptoms: Feeling angry/irritable Substance abuse history and/or treatment for substance abuse?: No Suicide prevention information given to non-admitted patients: Not applicable  Risk to Others within the past 6 months Homicidal Ideation: No Does patient have any lifetime risk of violence toward others beyond the six months prior to admission? : No Thoughts of Harm to Others: No Current  Homicidal Intent: No Current Homicidal Plan: No Access to Homicidal Means: No History of harm to others?: No Assessment of Violence: None Noted Does patient have access to weapons?: No Criminal Charges Pending?: No Does patient have a court date: No Is  patient on probation?: No  Psychosis Hallucinations: None noted Delusions: None noted  Mental Status Report Appearance/Hygiene: Unremarkable Eye Contact: Fair Motor Activity: Unremarkable Speech: Logical/coherent Level of Consciousness: Alert Mood: Euthymic Affect: Appropriate to circumstance Anxiety Level: Minimal Thought Processes: Coherent Judgement: Impaired Orientation: Person, Time, Place, Situation Obsessive Compulsive Thoughts/Behaviors: Unable to Assess  Cognitive Functioning Concentration: Decreased Memory: Unable to Assess IQ: Below Average Insight: Poor Impulse Control: Fair Appetite: Good Sleep: No Change Vegetative Symptoms: None  ADLScreening [facility] Assessment Services) Patient's cognitive ability adequate to safely complete daily activities?: Yes Patient able to express need for assistance with ADLs?: Yes Independently performs ADLs?: Yes (appropriate for developmental age)  Prior Inpatient Therapy Prior Inpatient Therapy: Yes Prior Therapy Dates: 2018 Prior Therapy Facilty/Provider(s): [facility] Reason for Treatment: Schizoaffective d.o  Prior Outpatient Therapy Prior Outpatient Therapy: No Does patient have an ACCT team?: No Does patient have Intensive In-House Services?  : No Does patient have [facility] services? : No Does patient have P4CC services?: No  ADL Screening (condition at time of admission) Patient's cognitive ability adequate to safely complete daily activities?: Yes Is the patient deaf or have difficulty hearing?: No Does the patient have difficulty seeing, even when wearing glasses/contacts?: No Does the patient have difficulty concentrating, remembering, or making decisions?: Yes Patient able to express need for assistance with ADLs?: Yes Does the patient have difficulty dressing or bathing?: No Independently performs ADLs?: Yes (appropriate for developmental age) Does the patient have difficulty walking or climbing stairs?:  No Weakness of Legs: None Weakness of Arms/Hands: None  Home Assistive Devices/Equipment Home Assistive Devices/Equipment: None    Abuse/Neglect Assessment (Assessment to be complete while patient is alone) Physical Abuse: Denies Verbal Abuse: Denies Sexual Abuse: Denies Exploitation of patient/patient's resources: Denies Self-Neglect: Denies     Merchant navy officer (For Healthcare) Does Patient Have a Medical Advance Directive?: No Would patient like information on creating a medical advance directive?: No - Patient declined    Additional Information 1:1 In Past 12 Months?: No CIRT Risk: No Elopement Risk: No Does patient have medical clearance?: Yes     Disposition:  Disposition Initial Assessment Completed for this Encounter: Yes(consulted with Dr. Sharma Covert and Elta Guadeloupe, NP) Disposition of Patient: Discharge with Outpatient Resources  On Site Evaluation by:   Reviewed with Physician:    Laddie Aquas 06/04/2017 12:02 PM

## 2017-06-23 ENCOUNTER — Emergency Department (HOSPITAL_COMMUNITY)
Admission: EM | Admit: 2017-06-23 | Discharge: 2017-06-23 | Disposition: A | Discharge status: Home / Self Care | Payer: Medicaid Other | Attending: Emergency Medicine | Admitting: Emergency Medicine

## 2017-06-23 ENCOUNTER — Encounter (HOSPITAL_COMMUNITY): Payer: Self-pay | Admitting: *Deleted

## 2017-06-23 ENCOUNTER — Emergency Department (HOSPITAL_COMMUNITY)
Admission: EM | Admit: 2017-06-23 | Discharge: 2017-06-23 | Disposition: A | Discharge status: Home / Self Care | Payer: Medicaid Other | Source: Home / Self Care | Attending: Emergency Medicine | Admitting: Emergency Medicine

## 2017-06-23 ENCOUNTER — Other Ambulatory Visit: Payer: Self-pay

## 2017-06-23 DIAGNOSIS — M549 Dorsalgia, unspecified: Secondary | ICD-10-CM | POA: Diagnosis present

## 2017-06-23 DIAGNOSIS — Z5321 Procedure and treatment not carried out due to patient leaving prior to being seen by health care provider: Secondary | ICD-10-CM | POA: Insufficient documentation

## 2017-06-23 DIAGNOSIS — R45851 Suicidal ideations: Secondary | ICD-10-CM

## 2017-06-23 MED ORDER — BENZTROPINE MESYLATE 1 MG PO TABS: 1.0000 mg | ORAL_TABLET | Freq: Two times a day (BID) | ORAL | Status: DC

## 2017-06-23 MED ORDER — HALOPERIDOL 5 MG PO TABS: 20.0000 mg | ORAL_TABLET | Freq: Every day | ORAL | Status: DC

## 2017-06-23 MED ORDER — HYDROXYZINE HCL 25 MG PO TABS: 25.0000 mg | ORAL_TABLET | Freq: Every day | ORAL | Status: DC

## 2017-06-23 MED ORDER — LORAZEPAM 1 MG PO TABS: 2.0000 mg | ORAL_TABLET | Freq: Two times a day (BID) | ORAL | Status: DC | PRN

## 2017-06-23 MED ORDER — VITAMIN D (ERGOCALCIFEROL) 1.25 MG (50000 UNIT) PO CAPS
50000.0000 [IU] | ORAL_CAPSULE | ORAL | Status: DC
  Filled 2017-06-23: qty 1

## 2017-06-23 MED ORDER — HALOPERIDOL 5 MG PO TABS: 10.0000 mg | ORAL_TABLET | Freq: Every day | ORAL | Status: DC

## 2017-06-23 MED ORDER — METFORMIN HCL ER 750 MG PO TB24
750.0000 mg | ORAL_TABLET | Freq: Every day | ORAL | Status: DC
  Filled 2017-06-23: qty 1

## 2017-06-23 MED ORDER — SENNOSIDES-DOCUSATE SODIUM 8.6-50 MG PO TABS: 1.0000 | ORAL_TABLET | Freq: Two times a day (BID) | ORAL | Status: DC

## 2017-06-23 MED ORDER — TRAZODONE HCL 100 MG PO TABS: 100.0000 mg | ORAL_TABLET | Freq: Every day | ORAL | Status: DC

## 2017-06-23 MED ORDER — FOLIC ACID 1 MG PO TABS: 1.0000 mg | ORAL_TABLET | Freq: Every day | ORAL | Status: DC

## 2017-06-23 MED ORDER — CLOZAPINE 100 MG PO TABS: 200.0000 mg | ORAL_TABLET | Freq: Two times a day (BID) | ORAL | Status: DC

## 2017-06-23 MED ORDER — DIVALPROEX SODIUM ER 500 MG PO TB24: 1000.0000 mg | ORAL_TABLET | Freq: Two times a day (BID) | ORAL | Status: DC

## 2017-06-23 MED ORDER — HYDROXYZINE PAMOATE 25 MG PO CAPS: 25.0000 mg | ORAL_CAPSULE | Freq: Every day | ORAL | Status: DC

## 2017-06-23 NOTE — ED Triage Notes (Signed)
Pt states she fell at a gas station and hurt her mouth, no evidence of injury, c/o back pain, pt states she is homeless

## 2017-06-23 NOTE — BH Assessment (Signed)
Assessment Note  Brittany FellerLinda Velez is a 32 y.o. female, [facility] for [facility] from her [facility], calling 911 to be brought to ED and reporting SI. Today, pt came to the ED earlier reporting that she fell and hit her mouth at a gas station. She also reported that she was homeless. She left the ED and [facility] the ED[facility] reporting that she was having chest pains, as well as dizziness/headache from a fall PTA, where she alleged that she hit her head. Pt later indicated SI w/ a plan to cut her throat. Upon speaking to the EDP, pt denied having hit her head or her mouth and denied having any pain. Pt continued to endorse SI. Clinician and Nanine MeansJamison Lord, DNP, went to speak to pt about returning to her group home, as this is her baseline behavior. Pt reports that she doesn't want to go to the group home b/c they "poured water down my throat and tried to kill me".    Clinician called the crisis line for pt's guardian, [facility] [employer] [phone]) and spoke to [facility] Massenburg, who will be calling pt's group home to pick her up.   Diagnosis: GAD  Past Medical History:  Past Medical History:  Diagnosis Date  . Diabetes mellitus without complication (HCC)   . Retardation   . Schizo-affective schizophrenia (HCC)     No past surgical history on file.  Family History: No family history on file.  Social History:  reports that she has quit smoking. Her smoking use included cigarettes. she has never used smokeless tobacco. She reports that she does not drink alcohol or use drugs.  Additional Social History:  Alcohol / Drug Use Pain Medications: See MAR Prescriptions: See MAR Over the Counter: See MAR History of alcohol / drug use?: No history of alcohol / drug abuse  CIWA: CIWA-Ar BP: 128/72 Pulse Rate: (!) 115 COWS:    Allergies: No Known Allergies  Home Medications:  (Not in a hospital admission)  OB/GYN Status:  No LMP  recorded.  General Assessment Data [facility] of [facility] T[facility] Assessment: In system Is this a Tele or Face-to-Face Assessment?: Face-to-Face Is this an Initial Assessment or a Re-assessment for this encounter?: Initial Assessment Marital status: Single Is patient pregnant?: No Pregnancy Status: No Living Arrangements: Group Home([facility]) Can pt return to current living arrangement?: Yes Admission Status: Voluntary Is patient capable of signing voluntary admission?: No Referral Source: Self/Family/Friend Insurance type: Medicaid     Crisis Care Plan Living Arrangements: Group Home([facility]) Legal Guardian: Other: Name of Psychiatrist: pt denies Name of Therapist: pt denies   Education Status Is patient currently in school?: No Highest grade of school patient has completed: 12th  Risk to self with the past 6 months Suicidal Ideation: Yes-Currently Present Has patient been a risk to self within the past 6 months prior to admission? : No Suicidal Intent: No Has patient had any suicidal intent within the past 6 months prior to admission? : No Is patient at risk for suicide?: No Suicidal Plan?: Yes-Currently Present Has patient had any suicidal plan within the past 6 months prior to admission? : Yes Specify Current Suicidal Plan: cut throat Access to Means: No Previous Attempts/Gestures: No Intentional Self Injurious Behavior: None Family Suicide History: No Recent stressful life event(s): Other (Comment)(don't want to be at group home) Persecutory voices/beliefs?: No Depression: Yes Depression Symptoms: Feeling angry/irritable Substance abuse history and/or treatment  for substance abuse?: No Suicide prevention information given to non-admitted patients: Not applicable  Risk to Others within the past 6 months Homicidal Ideation: No Does patient have any lifetime risk of violence toward others beyond the six months prior to admission? : No Thoughts of  Harm to Others: No Current Homicidal Intent: No Current Homicidal Plan: No Access to Homicidal Means: No History of harm to others?: No Assessment of Violence: None Noted Does patient have access to weapons?: No Criminal Charges Pending?: No Does patient have a court date: No Is patient on probation?: No  Psychosis Hallucinations: None noted Delusions: None noted  Mental Status Report Appearance/Hygiene: Unremarkable Eye Contact: Good Motor Activity: Unremarkable Speech: Slow Level of Consciousness: Alert Mood: Euthymic Affect: Inconsistent with thought content Anxiety Level: Minimal Thought Processes: Coherent Judgement: Impaired Orientation: Person, Time, Place, Situation Obsessive Compulsive Thoughts/Behaviors: Unable to Assess  Cognitive Functioning Concentration: Decreased Memory: Unable to Assess IQ: Below Average Insight: Poor Impulse Control: Good Appetite: Good Sleep: No Change Vegetative Symptoms: None  ADLScreening [facility] Assessment Services) Patient's cognitive ability adequate to safely complete daily activities?: Yes Patient able to express need for assistance with ADLs?: Yes Independently performs ADLs?: Yes (appropriate for developmental age)  Prior Inpatient Therapy Prior Inpatient Therapy: Yes Prior Therapy Dates: 2018 Prior Therapy Facilty/Provider(s): [facility] Reason for Treatment: Schizoaffective d.o  Prior Outpatient Therapy Prior Outpatient Therapy: No Does patient have an ACCT team?: No Does patient have Intensive In-House Services?  : No Does patient have [facility] services? : No Does patient have P4CC services?: No  ADL Screening (condition at time of admission) Patient's cognitive ability adequate to safely complete daily activities?: Yes Is the patient deaf or have difficulty hearing?: No Does the patient have difficulty seeing, even when wearing glasses/contacts?: No Does the patient have difficulty concentrating, remembering,  or making decisions?: Yes Patient able to express need for assistance with ADLs?: Yes Does the patient have difficulty dressing or bathing?: No Independently performs ADLs?: Yes (appropriate for developmental age) Does the patient have difficulty walking or climbing stairs?: No Weakness of Legs: None Weakness of Arms/Hands: None  Home Assistive Devices/Equipment Home Assistive Devices/Equipment: None    Abuse/Neglect Assessment (Assessment to be complete while patient is alone) Physical Abuse: Denies Verbal Abuse: Denies Sexual Abuse: Denies Exploitation of patient/patient's resources: Denies Self-Neglect: Denies     Merchant navy officer (For Healthcare) Does Patient Have a Medical Advance Directive?: No Would patient like information on creating a medical advance directive?: No - Patient declined    Additional Information 1:1 In Past 12 Months?: No CIRT Risk: No Elopement Risk: No Does patient have medical clearance?: Yes     Disposition:  Disposition Initial Assessment Completed for this Encounter: Yes(consulted with [name], DNP) Disposition of Patient: Discharge with Outpatient Resources  On Site Evaluation by:   Reviewed with Physician:    Laddie Aquas 06/23/2017 4:31 PM

## 2017-06-23 NOTE — ED Triage Notes (Signed)
Per EMS: patient was here earlier and left prior to being placed in a room. [facility], stated to EMS that her family left her and she stated to EMS that she was experiencing chest pain. Very vague with description per EMS. +SI   Per RN assessment: patient states that she fell and hit her head PTA, complaining of dizziness/headache. Denies SI. +flight of ideas but calm and cooperative.

## 2017-06-23 NOTE — BH Assessment (Signed)
[facility] Assessment Progress Note  Clinician rec'd call from Con MemosAlex Young from pt's group home. He is aware of pt's disposition to d/c and will be coming to get her. Pt receives OP psych services with [facility]Pavelock [facility].   Johny ShockSamantha M. Ladona Ridgelaylor, MS, NCC, LPCA Counselor

## 2017-06-23 NOTE — Progress Notes (Signed)
Pt transferred to room 30 for closer monitoring per [name], AC's request. Brittany SisVera Aila Terra,rn.

## 2017-06-23 NOTE — ED Notes (Signed)
Pt discharged to home with  Butch PennyLatasha Davis from group home. Left ambulatory accompanied by security officer. Left in stable condition.  Derinda SisVera Osias Resnick,rn.

## 2017-06-23 NOTE — ED Notes (Signed)
Pt come in with EMS stating she was suicidal and planning to cut her throat, witnessed by Clinical research associatewriter and E [employer] Tech.

## 2017-06-23 NOTE — ED Notes (Signed)
Pt left facility and called EMS to bring her back. She will be removed from system and re entered.

## 2017-06-23 NOTE — ED Notes (Signed)
Bed: WA30 Expected date:  Expected time:  Means of arrival:  Comments: 
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
## 2017-08-27 ENCOUNTER — Emergency Department (HOSPITAL_COMMUNITY)
Admission: EM | Admit: 2017-08-27 | Discharge: 2017-08-27 | Disposition: A | Discharge status: Home / Self Care | Payer: Medicaid Other | Attending: Emergency Medicine | Admitting: Emergency Medicine

## 2017-08-27 ENCOUNTER — Encounter (HOSPITAL_COMMUNITY): Payer: Self-pay | Admitting: Emergency Medicine

## 2017-08-27 DIAGNOSIS — Y939 Activity, unspecified: Secondary | ICD-10-CM | POA: Insufficient documentation

## 2017-08-27 DIAGNOSIS — Z87891 Personal history of nicotine dependence: Secondary | ICD-10-CM | POA: Insufficient documentation

## 2017-08-27 DIAGNOSIS — Z7984 Long term (current) use of oral hypoglycemic drugs: Secondary | ICD-10-CM | POA: Diagnosis not present

## 2017-08-27 DIAGNOSIS — Y999 Unspecified external cause status: Secondary | ICD-10-CM | POA: Diagnosis not present

## 2017-08-27 DIAGNOSIS — E119 Type 2 diabetes mellitus without complications: Secondary | ICD-10-CM | POA: Insufficient documentation

## 2017-08-27 DIAGNOSIS — S0083XA Contusion of other part of head, initial encounter: Secondary | ICD-10-CM | POA: Diagnosis present

## 2017-08-27 DIAGNOSIS — Z79899 Other long term (current) drug therapy: Secondary | ICD-10-CM | POA: Diagnosis not present

## 2017-08-27 DIAGNOSIS — Y929 Unspecified place or not applicable: Secondary | ICD-10-CM | POA: Insufficient documentation

## 2017-08-27 MED ORDER — IBUPROFEN 600 MG PO TABS: 600.0000 mg | ORAL_TABLET | Freq: Four times a day (QID) | ORAL | 0 refills | Status: DC | PRN
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
## 2017-08-27 NOTE — Discharge Instructions (Signed)
Return if any problems.

## 2018-02-16 ENCOUNTER — Encounter (HOSPITAL_COMMUNITY): Payer: Self-pay | Admitting: Emergency Medicine

## 2018-02-16 ENCOUNTER — Emergency Department (HOSPITAL_COMMUNITY)
Admission: EM | Admit: 2018-02-16 | Discharge: 2018-02-17 | Disposition: A | Discharge status: Home / Self Care | Payer: Medicaid Other | Attending: Emergency Medicine | Admitting: Emergency Medicine

## 2018-02-16 ENCOUNTER — Other Ambulatory Visit: Payer: Self-pay

## 2018-02-16 DIAGNOSIS — Z7984 Long term (current) use of oral hypoglycemic drugs: Secondary | ICD-10-CM | POA: Diagnosis not present

## 2018-02-16 DIAGNOSIS — F4325 Adjustment disorder with mixed disturbance of emotions and conduct: Secondary | ICD-10-CM | POA: Diagnosis not present

## 2018-02-16 DIAGNOSIS — R440 Auditory hallucinations: Secondary | ICD-10-CM | POA: Insufficient documentation

## 2018-02-16 DIAGNOSIS — F258 Other schizoaffective disorders: Secondary | ICD-10-CM | POA: Diagnosis not present

## 2018-02-16 DIAGNOSIS — R441 Visual hallucinations: Secondary | ICD-10-CM | POA: Diagnosis not present

## 2018-02-16 DIAGNOSIS — E119 Type 2 diabetes mellitus without complications: Secondary | ICD-10-CM | POA: Insufficient documentation

## 2018-02-16 DIAGNOSIS — Z87891 Personal history of nicotine dependence: Secondary | ICD-10-CM | POA: Insufficient documentation

## 2018-02-16 DIAGNOSIS — Z79899 Other long term (current) drug therapy: Secondary | ICD-10-CM | POA: Diagnosis not present

## 2018-02-16 DIAGNOSIS — F79 Unspecified intellectual disabilities: Secondary | ICD-10-CM | POA: Diagnosis not present

## 2018-02-16 DIAGNOSIS — R4587 Impulsiveness: Secondary | ICD-10-CM | POA: Diagnosis not present

## 2018-02-16 DIAGNOSIS — R45851 Suicidal ideations: Secondary | ICD-10-CM | POA: Diagnosis not present

## 2018-02-16 DIAGNOSIS — F329 Major depressive disorder, single episode, unspecified: Secondary | ICD-10-CM | POA: Diagnosis present

## 2018-02-16 LAB — COMPREHENSIVE METABOLIC PANEL
ALT: 11 U/L (ref 0–44)
AST: 14 U/L — ABNORMAL LOW (ref 15–41)
Albumin: 3.8 g/dL (ref 3.5–5.0)
Alkaline Phosphatase: 62 U/L (ref 38–126)
Anion gap: 10 (ref 5–15)
BUN: 16 mg/dL (ref 6–20)
CHLORIDE: 108 mmol/L (ref 98–111)
CO2: 24 mmol/L (ref 22–32)
CREATININE: 0.63 mg/dL (ref 0.44–1.00)
Calcium: 9.2 mg/dL (ref 8.9–10.3)
Glucose, Bld: 90 mg/dL (ref 70–99)
POTASSIUM: 4.2 mmol/L (ref 3.5–5.1)
Sodium: 142 mmol/L (ref 135–145)
Total Bilirubin: 0.3 mg/dL (ref 0.3–1.2)
Total Protein: 7.6 g/dL (ref 6.5–8.1)

## 2018-02-16 LAB — DIFFERENTIAL
BASOS ABS: 0 10*3/uL (ref 0.0–0.1)
BASOS PCT: 0 %
EOS ABS: 0.1 10*3/uL (ref 0.0–0.7)
EOS PCT: 2 %
Lymphocytes Relative: 34 %
Lymphs Abs: 2.3 10*3/uL (ref 0.7–4.0)
Monocytes Absolute: 0.4 10*3/uL (ref 0.1–1.0)
Monocytes Relative: 6 %
Neutro Abs: 3.8 10*3/uL (ref 1.7–7.7)
Neutrophils Relative %: 58 %

## 2018-02-16 LAB — RAPID URINE DRUG SCREEN, HOSP PERFORMED
AMPHETAMINES: NOT DETECTED
Barbiturates: NOT DETECTED
Benzodiazepines: NOT DETECTED
Cocaine: NOT DETECTED
Opiates: NOT DETECTED
Tetrahydrocannabinol: NOT DETECTED

## 2018-02-16 LAB — ETHANOL

## 2018-02-16 LAB — I-STAT BETA HCG BLOOD, ED (MC, WL, AP ONLY): I-stat hCG, quantitative: <5 m[IU]/mL (ref <5)

## 2018-02-16 LAB — CBC
HCT: 34 % — ABNORMAL LOW (ref 36.0–46.0)
Hemoglobin: 10.9 g/dL — ABNORMAL LOW (ref 12.0–15.0)
MCH: 29.7 pg (ref 26.0–34.0)
MCHC: 32.1 g/dL (ref 30.0–36.0)
MCV: 92.6 fL (ref 78.0–100.0)
PLATELETS: 295 10*3/uL (ref 150–400)
RBC: 3.67 MIL/uL — AB (ref 3.87–5.11)
RDW: 13.8 % (ref 11.5–15.5)
WBC: 6.6 10*3/uL (ref 4.0–10.5)

## 2018-02-16 LAB — SALICYLATE LEVEL

## 2018-02-16 LAB — ACETAMINOPHEN LEVEL

## 2018-02-16 MED ORDER — HYDROXYZINE HCL 25 MG PO TABS
25.0000 mg | ORAL_TABLET | Freq: Every day | ORAL | Status: DC
  Administered 2018-02-16: 25 mg via ORAL
  Filled 2018-02-16: qty 1

## 2018-02-16 MED ORDER — BENZTROPINE MESYLATE 1 MG PO TABS
1.0000 mg | ORAL_TABLET | Freq: Two times a day (BID) | ORAL | Status: DC
  Administered 2018-02-16: 1 mg via ORAL
  Filled 2018-02-16 (×2): qty 1

## 2018-02-16 MED ORDER — LORAZEPAM 1 MG PO TABS
1.0000 mg | ORAL_TABLET | Freq: Two times a day (BID) | ORAL | Status: DC | PRN
  Administered 2018-02-17: 1 mg via ORAL
  Filled 2018-02-16: qty 1

## 2018-02-16 MED ORDER — CLOZAPINE 100 MG PO TABS
300.0000 mg | ORAL_TABLET | Freq: Every day | ORAL | Status: DC
  Administered 2018-02-17: 300 mg via ORAL
  Filled 2018-02-16: qty 3

## 2018-02-16 MED ORDER — HALOPERIDOL 5 MG PO TABS
10.0000 mg | ORAL_TABLET | Freq: Two times a day (BID) | ORAL | Status: DC
  Administered 2018-02-16: 20 mg via ORAL
  Filled 2018-02-16: qty 2
  Filled 2018-02-16: qty 4

## 2018-02-16 MED ORDER — CLONIDINE HCL 0.1 MG PO TABS
0.1000 mg | ORAL_TABLET | Freq: Every day | ORAL | Status: DC
  Administered 2018-02-16: 0.1 mg via ORAL
  Filled 2018-02-16: qty 1

## 2018-02-16 MED ORDER — TRAZODONE HCL 100 MG PO TABS
200.0000 mg | ORAL_TABLET | Freq: Every day | ORAL | Status: DC
  Administered 2018-02-16: 200 mg via ORAL
  Filled 2018-02-16: qty 2

## 2018-02-16 MED ORDER — DIVALPROEX SODIUM ER 500 MG PO TB24
1000.0000 mg | ORAL_TABLET | Freq: Every day | ORAL | Status: DC
  Administered 2018-02-16: 1000 mg via ORAL
  Filled 2018-02-16: qty 2

## 2018-02-16 NOTE — ED Notes (Signed)
Bed: [facility] Expected date:  Expected time:  Means of arrival:  Comments: 

## 2018-02-16 NOTE — ED Notes (Signed)
Pt A&O x 3, no distress noted, calm & cooperative, tearful and sad, requesting to go to a different group home.  Monitoring for safety, Q 15 min checks in effect.

## 2018-02-16 NOTE — ED Triage Notes (Signed)
Patient Brittany Velez from group home, c/o SI with plan to "cut my throat with a razor." Patient states "I just don't love myself no more." Denies A/V/H.

## 2018-02-16 NOTE — ED Notes (Signed)
Pt's group home at registration desk stating that they were here to pick up the patient and take them back to their current group. However this Clinical research associate advised the charge nurse Patty that group home was present and ready to take pt home I then was advised that pt would not be discharged until the doctor evaluates patients chief complaint.

## 2018-02-16 NOTE — ED Notes (Signed)
Pt now stating she is seeing Demons in her room and her mother and father.  RN informed pt that there aren't any Demons in her room and she is in a safe place.  Pt resting at present.

## 2018-02-16 NOTE — ED Provider Notes (Signed)
[facility]-EMERGENC[facility] DEPT Provider Note   CSN: [id number] Arrival date & time: 02/16/18  1641     History   Chief Complaint Chief Complaint  Patient presents with  . Suicidal    HPI Brittany Velez is a 32 y.o. female with history of diabetes mellitus, schizoaffective schizophrenia, and mental retardation presents for evaluation of worsening suicidal ideations for the past several days.  She has a plan to cut herself with a razor.  She denies homicidal ideation, auditory or visual hallucinations.  She denies alcohol or recreational drug use but states she smokes 3 to 4 cigarettes daily.  She states that she just "does not love herself anymore".  Denies any medical complaints at this time.  The history is provided by the patient.    Past Medical History:  Diagnosis Date  . Diabetes mellitus without complication (HCC)   . Retardation   . Schizo-affective schizophrenia [facility])     Patient Active Problem List   Diagnosis Date Noted  . 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 Schizophrenia, schizoaffective, chronic with acute exacerbation (HCC) 06/06/2014  . Auditory hallucination   . Visual hallucination     History reviewed. No pertinent surgical history.   OB History   None      Home Medications    Prior to Admission medications   Medication Sig Start Date End Date Taking? Authorizing Provider  benztropine (COGENTIN) 1 MG tablet Take 1 mg by mouth 2 (two) times daily.    [provider]  cloNIDine (CATAPRES) 0.1 MG tablet Take 0.1 mg by mouth at bedtime.    [provider]  clozapine (CLOZARIL) 200 MG tablet Take 200-600 mg by mouth 2 (two) times daily. 200 mg every morning 600 mg every night    [provider]  divalproex (DEPAKOTE ER) 500 MG 24 hr tablet Take 1,000 mg by mouth at bedtime.     [provider]  ergocalciferol (VITAMIN D2) 50000 units capsule Take 50,000  Units by mouth every Sunday.    [provider]  folic acid (FOLVITE) 1 MG tablet Take 1 mg by mouth daily.    [provider]  haloperidol (HALDOL) 10 MG tablet Take 10-20 mg by mouth 2 (two) times daily. 10 MG in AM and 20 MG at bedtime    [provider]  hydrOXYzine (VISTARIL) 25 MG capsule Take 25 mg by mouth at bedtime.    [provider]  ibuprofen (ADVIL,MOTRIN) 600 MG tablet Take 1 tablet (600 mg total) by mouth every 6 (six) hours as needed. 08/27/17   Elson Areas, PA-C  lisinopril-hydrochlorothiazide (PRINZIDE,ZESTORETIC) 10-12.5 MG tablet Take 1 tablet by mouth daily.    [provider]  LORazepam (ATIVAN) 1 MG tablet Take 1 mg by mouth 2 (two) times daily as needed for anxiety (agitation).     [provider]  metFORMIN (GLUCOPHAGE-XR) 750 MG 24 hr tablet Take 750 mg by mouth at bedtime.     [provider]  senna-docusate (SENOKOT-S) 8.6-50 MG tablet Take 1 tablet by mouth 2 (two) times daily.    [provider]  traZODone (DESYREL) 100 MG tablet Take 200 mg by mouth at bedtime.     [provider]    Family History No family history on file.  Social History Social History   Tobacco Use  . Smoking status: Former Smoker    Types: Cigarettes  . Smokeless tobacco: Never Used  Substance Use  Topics  . Alcohol use: No  . Drug use: No     Allergies   Patient has no known allergies.   Review of Systems Review of Systems  Constitutional: Negative for chills and fever.  Respiratory: Negative for shortness of breath.   Cardiovascular: Negative for chest pain.  Gastrointestinal: Negative for abdominal pain, nausea and vomiting.  Psychiatric/Behavioral: Positive for dysphoric mood and suicidal ideas.  All other systems reviewed and are negative.    Physical Exam Updated Vital Signs BP 125/77 (BP Location: Left Arm)   Pulse (!) 101   Temp 98.3 F (36.8 C) (Oral)   Resp 18   Wt 100.7  kg   SpO2 98%   BMI 36.94 kg/m   Physical Exam  Constitutional: She appears well-developed and well-nourished. No distress.  HENT:  Head: Normocephalic and atraumatic.  Eyes: Conjunctivae are normal. Right eye exhibits no discharge. Left eye exhibits no discharge.  Neck: No JVD present. No tracheal deviation present.  Cardiovascular: Normal rate, regular rhythm, normal heart sounds and intact distal pulses.  Pulmonary/Chest: Effort normal and breath sounds normal.  Abdominal: Soft. Bowel sounds are normal. She exhibits no distension. There is no tenderness. There is no guarding.  Musculoskeletal: She exhibits no edema.  Neurological: She is alert.  Skin: Skin is warm and dry. No erythema.  Psychiatric: Her speech is normal. She is withdrawn. She exhibits a depressed mood. She expresses suicidal ideation. She expresses no homicidal ideation. She expresses suicidal plans. She expresses no homicidal plans.  Does not appear to be responding to internal stimuli at this time.  Nursing note and vitals reviewed.    ED Treatments / Results  Labs (all labs ordered are listed, but only abnormal results are displayed) Labs Reviewed  COMPREHENSIVE METABOLIC PANEL - Abnormal; Notable for the following components:      Result Value   AST 14 (*)    All other components within normal limits  ACETAMINOPHEN LEVEL - Abnormal; Notable for the following components:   Acetaminophen (Tylenol), Serum <10 (*)    All other components within normal limits  CBC - Abnormal; Notable for the following components:   RBC 3.67 (*)    Hemoglobin 10.9 (*)    HCT 34.0 (*)    All other components within normal limits  ETHANOL  SALICYLATE LEVEL  RAPID URINE DRUG SCREEN, HOSP PERFORMED  I-STAT BETA HCG BLOOD, ED ([facility], [facility], [facility] ONLY)    EKG None  Radiology No results found.  Procedures Procedures (including critical care time)  Medications Ordered in ED Medications - No data to display   Initial  Impression / Assessment and Plan / ED Course  I have reviewed the triage vital signs and the nursing notes.  Pertinent labs & imaging results that were available during my care of the patient were reviewed by me and considered in my medical decision making (see chart for details).     Patient presents for evaluation of suicidal ideation.  She is afebrile, vital signs are stable.  She is nontoxic in appearance.  Physical examination reassuring.  Screening labs reviewed by me with no acute abnormalities.  She is medically clear for TTS evaluation at this time.  TTS recommends overnight observation and a.m. psych evaluation.  Of note, patient is here voluntarily and may require IVC if she attempts to leave prior to their assessment.  Final Clinical Impressions(s) / ED Diagnoses   Final diagnoses:  Suicidal ideation    ED Discharge Orders  None       Bennye Alm 02/16/18 Janeice Robinson, MD 02/16/18 2212
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
## 2018-02-16 NOTE — ED Notes (Signed)
Report to RN Dorene Grebe, pt transferred to Main ED rm #[id number].
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
## 2018-02-16 NOTE — ED Notes (Signed)
Patient changed into paper scrubs and wanded by security. One labeled patient belongings bag transferred with patient. 
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
## 2018-02-17 DIAGNOSIS — R4587 Impulsiveness: Secondary | ICD-10-CM

## 2018-02-17 DIAGNOSIS — Z87891 Personal history of nicotine dependence: Secondary | ICD-10-CM

## 2018-02-17 DIAGNOSIS — F4325 Adjustment disorder with mixed disturbance of emotions and conduct: Secondary | ICD-10-CM

## 2018-02-17 LAB — I-STAT BETA HCG BLOOD, ED (MC, WL, AP ONLY)

## 2018-02-17 MED ORDER — CLOZAPINE 100 MG PO TABS
100.0000 mg | ORAL_TABLET | Freq: Every day | ORAL | Status: DC
  Filled 2018-02-17: qty 1

## 2018-02-17 NOTE — ED Notes (Signed)
Pt is asking when the doctor will be in to see her, because she wants to be moved to a different hospital.

## 2018-02-17 NOTE — ED Notes (Signed)
Patient is resting comfortably. 

## 2018-02-17 NOTE — ED Notes (Signed)
PT draws locked and room secure

## 2018-02-17 NOTE — ED Notes (Signed)
Patient sitting in room quietly. Pt fully in nurse's visual field.

## 2018-02-17 NOTE — Progress Notes (Addendum)
CSW aware patient is psychiatrically and medically cleared for discharge. CSW aware patient has a legal guardian and is from [facility]. CSW has reached out to patient's legal guardian, [name] with [facility] [employer] [phone], regarding patient disposition. [name] agreeable to patient being discharged back to group home. CSW has attempted to reach out to [name], [place] with [facility] [phone], regarding pick up time. CSW has left voicemail for return call.  10:47am- CSW spoke with French Ana, owner of [facility] [phone], regarding patient disposition. Per French Ana, patient does this often. Per French Ana, she would have Reita Cliche come pick up patient within the hour. CSW to update RN.  Archie Balboa, LCSWA  Clinical Social Work Department  [employer]  [phone]
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
## 2018-02-17 NOTE — ED Notes (Addendum)
Sitter at bedside.

## 2018-02-17 NOTE — BH Assessment (Signed)
[facility] Assessment Progress Note  Per Jacqueline Norman, DO, this pt does not require psychiatric hospitalization at this time.  Pt is to be discharged from [facility].  She is to return to her group home.  Mackenzie Irwin, LCSW agrees to facilitate this.  Pt's nurse has been notified.  Clarity Ciszek, MA Triage Specialist [phone]     

## 2018-02-17 NOTE — ED Notes (Signed)
Patient in bathroom changing out of scrubs.
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
## 2018-02-17 NOTE — ED Notes (Signed)
Patient refusing medications and signature pad. Patient initially refused BP.
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
## 2018-04-13 ENCOUNTER — Other Ambulatory Visit: Payer: Self-pay

## 2018-04-13 ENCOUNTER — Emergency Department (HOSPITAL_COMMUNITY)
Admission: EM | Admit: 2018-04-13 | Discharge: 2018-04-14 | Disposition: A | Discharge status: Home / Self Care | Payer: Medicaid Other | Attending: Emergency Medicine | Admitting: Emergency Medicine

## 2018-04-13 DIAGNOSIS — F259 Schizoaffective disorder, unspecified: Secondary | ICD-10-CM | POA: Diagnosis present

## 2018-04-13 DIAGNOSIS — Z7984 Long term (current) use of oral hypoglycemic drugs: Secondary | ICD-10-CM | POA: Diagnosis not present

## 2018-04-13 DIAGNOSIS — R45851 Suicidal ideations: Secondary | ICD-10-CM | POA: Insufficient documentation

## 2018-04-13 DIAGNOSIS — E119 Type 2 diabetes mellitus without complications: Secondary | ICD-10-CM | POA: Diagnosis not present

## 2018-04-13 DIAGNOSIS — F4325 Adjustment disorder with mixed disturbance of emotions and conduct: Secondary | ICD-10-CM | POA: Insufficient documentation

## 2018-04-13 DIAGNOSIS — Z87891 Personal history of nicotine dependence: Secondary | ICD-10-CM | POA: Diagnosis not present

## 2018-04-13 LAB — CBC
HCT: 32.6 % — ABNORMAL LOW (ref 36.0–46.0)
Hemoglobin: 10.1 g/dL — ABNORMAL LOW (ref 12.0–15.0)
MCH: 29.7 pg (ref 26.0–34.0)
MCHC: 31 g/dL (ref 30.0–36.0)
MCV: 95.9 fL (ref 80.0–100.0)
NRBC: 0 % (ref 0.0–0.2)
Platelets: 234 10*3/uL (ref 150–400)
RBC: 3.4 MIL/uL — ABNORMAL LOW (ref 3.87–5.11)
RDW: 13.3 % (ref 11.5–15.5)
WBC: 7.8 10*3/uL (ref 4.0–10.5)

## 2018-04-13 LAB — DIFFERENTIAL
Basophils Absolute: 0 10*3/uL (ref 0.0–0.1)
Basophils Relative: 0 %
EOS ABS: 0 10*3/uL (ref 0.0–0.5)
EOS PCT: 1 %
LYMPHS ABS: 1.5 10*3/uL (ref 0.7–4.0)
LYMPHS PCT: 20 %
Monocytes Absolute: 0.4 10*3/uL (ref 0.1–1.0)
Monocytes Relative: 5 %
Neutro Abs: 5.6 10*3/uL (ref 1.7–7.7)
Neutrophils Relative %: 74 %

## 2018-04-13 LAB — COMPREHENSIVE METABOLIC PANEL
ALK PHOS: 58 U/L (ref 38–126)
ALT: 13 U/L (ref 0–44)
AST: 15 U/L (ref 15–41)
Albumin: 3.8 g/dL (ref 3.5–5.0)
Anion gap: 8 (ref 5–15)
BUN: 14 mg/dL (ref 6–20)
CALCIUM: 8.7 mg/dL — AB (ref 8.9–10.3)
CHLORIDE: 108 mmol/L (ref 98–111)
CO2: 23 mmol/L (ref 22–32)
CREATININE: 0.53 mg/dL (ref 0.44–1.00)
GFR calc Af Amer: >60 mL/min (ref >60)
GFR calc non Af Amer: >60 mL/min (ref >60)
Glucose, Bld: 107 mg/dL — ABNORMAL HIGH (ref 70–99)
Potassium: 3.8 mmol/L (ref 3.5–5.1)
SODIUM: 139 mmol/L (ref 135–145)
Total Bilirubin: 0.4 mg/dL (ref 0.3–1.2)
Total Protein: 6.9 g/dL (ref 6.5–8.1)

## 2018-04-13 LAB — SALICYLATE LEVEL: Salicylate Lvl: <7 mg/dL (ref 2.8–30.0)

## 2018-04-13 LAB — ETHANOL: Alcohol, Ethyl (B): <10 mg/dL (ref <10)

## 2018-04-13 LAB — ACETAMINOPHEN LEVEL

## 2018-04-13 MED ORDER — HYDROXYZINE HCL 25 MG PO TABS
25.0000 mg | ORAL_TABLET | Freq: Every day | ORAL | Status: DC
  Administered 2018-04-14: 25 mg via ORAL
  Filled 2018-04-13: qty 1

## 2018-04-13 MED ORDER — HYDROCHLOROTHIAZIDE 12.5 MG PO CAPS
12.5000 mg | ORAL_CAPSULE | Freq: Every day | ORAL | Status: DC
  Administered 2018-04-14: 12.5 mg via ORAL
  Filled 2018-04-13: qty 1

## 2018-04-13 MED ORDER — METFORMIN HCL ER 750 MG PO TB24
750.0000 mg | ORAL_TABLET | Freq: Every day | ORAL | Status: DC
  Administered 2018-04-14: 750 mg via ORAL
  Filled 2018-04-13: qty 1

## 2018-04-13 MED ORDER — LISINOPRIL 10 MG PO TABS
10.0000 mg | ORAL_TABLET | Freq: Every day | ORAL | Status: DC
  Administered 2018-04-14: 10 mg via ORAL
  Filled 2018-04-13: qty 1

## 2018-04-13 MED ORDER — LORAZEPAM 1 MG PO TABS
1.0000 mg | ORAL_TABLET | Freq: Once | ORAL | Status: AC
  Administered 2018-04-13: 1 mg via ORAL
  Filled 2018-04-13: qty 1

## 2018-04-13 MED ORDER — ZOLPIDEM TARTRATE 10 MG PO TABS
10.0000 mg | ORAL_TABLET | Freq: Every day | ORAL | Status: DC
  Administered 2018-04-14: 10 mg via ORAL
  Filled 2018-04-13: qty 1

## 2018-04-13 MED ORDER — TRAZODONE HCL 100 MG PO TABS
100.0000 mg | ORAL_TABLET | Freq: Every day | ORAL | Status: DC
  Administered 2018-04-14: 100 mg via ORAL
  Filled 2018-04-13: qty 1

## 2018-04-13 MED ORDER — BENZTROPINE MESYLATE 1 MG PO TABS
1.0000 mg | ORAL_TABLET | Freq: Two times a day (BID) | ORAL | Status: DC
  Administered 2018-04-14 (×2): 1 mg via ORAL
  Filled 2018-04-13 (×2): qty 1

## 2018-04-13 MED ORDER — LORAZEPAM 1 MG PO TABS
1.0000 mg | ORAL_TABLET | Freq: Two times a day (BID) | ORAL | Status: DC
  Administered 2018-04-14: 1 mg via ORAL
  Filled 2018-04-13: qty 1

## 2018-04-13 MED ORDER — CLONIDINE HCL 0.1 MG PO TABS
0.1000 mg | ORAL_TABLET | Freq: Every day | ORAL | Status: DC
  Administered 2018-04-14: 0.1 mg via ORAL
  Filled 2018-04-13: qty 1

## 2018-04-13 MED ORDER — LISINOPRIL-HYDROCHLOROTHIAZIDE 10-12.5 MG PO TABS: 1.0000 | ORAL_TABLET | Freq: Every day | ORAL | Status: DC

## 2018-04-13 MED ORDER — HALOPERIDOL 5 MG PO TABS: 10.0000 mg | ORAL_TABLET | ORAL | Status: DC

## 2018-04-13 MED ORDER — DIVALPROEX SODIUM ER 250 MG PO TB24
250.0000 mg | ORAL_TABLET | Freq: Two times a day (BID) | ORAL | Status: DC
  Administered 2018-04-14 (×2): 250 mg via ORAL
  Filled 2018-04-13 (×2): qty 1

## 2018-04-13 MED ORDER — FOLIC ACID 1 MG PO TABS
2.0000 mg | ORAL_TABLET | Freq: Every day | ORAL | Status: DC
  Administered 2018-04-14: 2 mg via ORAL
  Filled 2018-04-13: qty 2

## 2018-04-13 MED ORDER — CLOZAPINE 100 MG PO TABS
400.0000 mg | ORAL_TABLET | Freq: Two times a day (BID) | ORAL | Status: DC
  Administered 2018-04-14 (×2): 400 mg via ORAL
  Filled 2018-04-13 (×2): qty 4
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
## 2018-04-13 NOTE — ED Notes (Signed)
[place]ter.  Charge Nurse Ilene notified, [facility].

## 2018-04-13 NOTE — ED Triage Notes (Signed)
Pt from Group Home, ranaway numerous times f rom Group Home, pt is learning disabled with history of Schizophrenia and Bipolar DO.

## 2018-04-13 NOTE — ED Notes (Signed)
Pt refuses to answer any questions. Pt roaming the halls, will continue to monitor.

## 2018-04-13 NOTE — ED Notes (Signed)
EMS pt brought in from Group Home, presents with complaint of SI and running away from Group Home many times in past few days.  Complaint of Anxiety, Schizophrenia, Bipolar DO, MR, Diabetes and HTN.  Monitoring for safety, sitter at bedside, wanded by Security, changed into scrubs.
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
## 2018-04-14 DIAGNOSIS — F4325 Adjustment disorder with mixed disturbance of emotions and conduct: Secondary | ICD-10-CM

## 2018-04-14 LAB — RAPID URINE DRUG SCREEN, HOSP PERFORMED
AMPHETAMINES: NOT DETECTED
BARBITURATES: NOT DETECTED
BENZODIAZEPINES: POSITIVE — AB
COCAINE: NOT DETECTED
Opiates: NOT DETECTED
Tetrahydrocannabinol: NOT DETECTED

## 2018-04-14 LAB — CBG MONITORING, ED
Glucose-Capillary: 104 mg/dL — ABNORMAL HIGH (ref 70–99)
Glucose-Capillary: 122 mg/dL — ABNORMAL HIGH (ref 70–99)

## 2018-04-14 MED ORDER — HALOPERIDOL 5 MG PO TABS
10.0000 mg | ORAL_TABLET | Freq: Every day | ORAL | Status: DC
  Administered 2018-04-14: 10 mg via ORAL
  Filled 2018-04-14: qty 2

## 2018-04-14 MED ORDER — HALOPERIDOL 5 MG PO TABS
20.0000 mg | ORAL_TABLET | Freq: Every day | ORAL | Status: DC
  Administered 2018-04-14: 20 mg via ORAL
  Filled 2018-04-14: qty 4

## 2018-04-14 NOTE — ED Notes (Signed)
Blood sugar 104

## 2018-04-14 NOTE — ED Notes (Signed)
AWARE OF NEED FOR URINE. WILL ATTEMPT TO URINATE IN BATHROOM.

## 2018-04-14 NOTE — ED Notes (Signed)
CBG 122 PT HAD BREAKFAST

## 2018-04-14 NOTE — ED Notes (Signed)
Refused vitals 

## 2018-04-14 NOTE — ED Notes (Addendum)
PT FLUSHED URINE DOWN TOILET. URINE NOT COLLECTED. PT UNDERSTOOD URINE COLLECTION METHOD AND AGREED TO COLLECT ANOTHER. PT ENCOURAGED AND AGREED TO SHOWER

## 2018-04-14 NOTE — ED Notes (Signed)
[facility] [phone] [name] SEARS.

## 2018-04-14 NOTE — BH Assessment (Signed)
[facility]  Per Juanetta BeetsJacqueline Norman, DO, this pt does not require psychiatric hospitalization at this time.  [facility]  She is to return to her group home.  Archie BalboaMackenzie Irwin, LCSW agrees to facilitate this.  Pt's nurse has been notified.  Doylene Canninghomas Lakya Schrupp, MA Triage Specialist [phone]

## 2018-04-14 NOTE — Progress Notes (Signed)
CSW informed patient has been psychiatrically and medically cleared for discharge. CSW aware patient has a legal guardian, [name] Fears with [employer] [phone]. CSW spoke to [place] and updated her of patient's disposition. Per Alcario DroughtErica, patient can [facility]. CSW spoke to group home who states they will have someone come pick up patient by 11am. CSW has updated patient's RN. No further CSW needs at this time, please reconsult if needs arise.  Archie BalboaMackenzie Irwin, LCSWA  Clinical Social Work Department  [facility]  [phone]

## 2018-04-14 NOTE — Consult Note (Addendum)
[facility]  04/14/2018 9:51 AM Brittany FellerLinda Velez  MRN:  [id number] Principal Problem: Adjustment disorder with mixed disturbance of emotions and conduct Discharge Diagnoses: Principal Problem:   Adjustment disorder with mixed disturbance of emotions and conduct  Subjective:32 yo female who presented to the ED after running away from her group home and banging her head because she was upset with the group home staff.  Today, she is calm and cooperative with no suicidal/homicidal ideations, hallucinations, or withdrawal symptoms.  Caveat:  She has MR with a low threshold for frustration.  Her behaviors increase when she is told she cannot do or have something and decrease when she gets her way.  No behavior issues at this time.  Stable to return to the group home.  Total Time spent with patient: 45 minutes  Past Psychiatric History: schizoaffective disorder  Past Medical History:  Past Medical History:  Diagnosis Date  . Diabetes mellitus without complication (HCC)   . Retardation   . Schizo-affective schizophrenia (HCC)    No past surgical history on file. Family History: No family history on file. Family Psychiatric  History: none Social History:  Social History   Substance and Sexual Activity  Alcohol Use No    Social History   Substance and Sexual Activity  Drug Use No   Social History   Socioeconomic History  . Marital status: Single    Spouse name: Not on file  . Number of children: Not on file  . Years of education: Not on file  . Highest education level: Not on file  Occupational History  . Not on file  Social Needs  . Financial resource strain: Not on file  . Food insecurity:    Worry: Not on file    Inability: Not on file  . Transportation needs:    Medical: Not on file    Non-medical: Not on file  Tobacco Use  . Smoking status: Former Smoker    Types: Cigarettes  . Smokeless tobacco: Never Used  Substance and Sexual Activity  . Alcohol use: No   . Drug use: No  . Sexual activity: Yes  Lifestyle  . Physical activity:    Days per week: Not on file    Minutes per session: Not on file  . Stress: Not on file  Relationships  . Social connections:    Talks on phone: Not on file    Gets together: Not on file    Attends religious service: Not on file    Active member of club or organization: Not on file    Attends meetings of clubs or organizations: Not on file    Relationship status: Not on file  Other Topics Concern  . Not on file  Social History Narrative   Pt stated that [place], but also that [place] family in [place], which is [facility].  Per history, Pt lives at [facility].    Has this patient used any form of tobacco in the last 30 days? (Cigarettes, Smokeless Tobacco, Cigars, and/or Pipes) She does not use tobacco products.  Current Medications: Current Facility-Administered Medications  Medication Dose Route Frequency Provider Last Rate Last Dose  . benztropine (COGENTIN) tablet 1 mg  1 mg Oral BID McDonald, Mia A, PA-C   1 mg at 04/14/18 0022  . cloNIDine (CATAPRES) tablet 0.1 mg  0.1 mg Oral QHS McDonald, Mia A, PA-C   0.1 mg at 04/14/18 0022  .  cloZAPine (CLOZARIL) tablet 400 mg  400 mg Oral BID McDonald, Mia A, PA-C   400 mg at 04/14/18 0019  . divalproex (DEPAKOTE ER) 24 hr tablet 250 mg  250 mg Oral BID McDonald, Mia A, PA-C   250 mg at 04/14/18 0022  . folic acid (FOLVITE) tablet 2 mg  2 mg Oral Daily McDonald, Mia A, PA-C      . haloperidol (HALDOL) tablet 10 mg  10 mg Oral Daily Lorenza Evangelist, RPH      . haloperidol (HALDOL) tablet 20 mg  20 mg Oral QHS Lorenza Evangelist, RPH   20 mg at 04/14/18 0020  . lisinopril (PRINIVIL,ZESTRIL) tablet 10 mg  10 mg Oral Daily Molpus, John, MD       And  . hydrochlorothiazide (MICROZIDE) capsule 12.5 mg  12.5 mg Oral Daily Molpus, John, MD      . hydrOXYzine (ATARAX/VISTARIL) tablet 25 mg  25 mg Oral QHS  McDonald, Mia A, PA-C   25 mg at 04/14/18 0021  . LORazepam (ATIVAN) tablet 1 mg  1 mg Oral BID McDonald, Mia A, PA-C      . metFORMIN (GLUCOPHAGE-XR) 24 hr tablet 750 mg  750 mg Oral QHS McDonald, Mia A, PA-C   750 mg at 04/14/18 0021  . traZODone (DESYREL) tablet 100 mg  100 mg Oral QPC breakfast McDonald, Mia A, PA-C      . zolpidem (AMBIEN) tablet 10 mg  10 mg Oral QHS McDonald, Mia A, PA-C   10 mg at 04/14/18 0022   Current Outpatient Medications  Medication Sig Dispense Refill  . benztropine (COGENTIN) 1 MG tablet Take 1 mg by mouth 2 (two) times daily.    . cloNIDine (CATAPRES) 0.1 MG tablet Take 0.1 mg by mouth at bedtime.    . cloZAPine (CLOZARIL) 100 MG tablet Take 400 mg by mouth 2 (two) times daily.   2  . divalproex (DEPAKOTE ER) 500 MG 24 hr tablet Take 250 mg by mouth 2 (two) times daily.     . folic acid (FOLVITE) 1 MG tablet Take 2 mg by mouth daily.     . haloperidol (HALDOL) 10 MG tablet Take 10-20 mg by mouth See admin instructions. Take 10 mg by mouth in the morning and 20 mg by mouth in the evening    . hydrOXYzine (ATARAX/VISTARIL) 25 MG tablet Take 25 mg by mouth at bedtime.  2  . lisinopril-hydrochlorothiazide (PRINZIDE,ZESTORETIC) 10-12.5 MG tablet Take 1 tablet by mouth daily.    Marland Kitchen LORazepam (ATIVAN) 0.5 MG tablet Take 1 mg by mouth 2 (two) times daily.   2  . metFORMIN (GLUCOPHAGE-XR) 750 MG 24 hr tablet Take 750 mg by mouth at bedtime.     . traZODone (DESYREL) 100 MG tablet Take 100 mg by mouth daily after breakfast.     . zolpidem (AMBIEN) 10 MG tablet Take 10 mg by mouth at bedtime.     PTA Medications:  (Not in a hospital admission)  Musculoskeletal: Strength & Muscle Tone: within normal limits Gait & Station: normal Patient leans: N/A  Psychiatric Specialty Exam: Physical Exam  Nursing note and vitals reviewed. Constitutional: She is oriented to person, place, and time. She appears well-developed and well-nourished.  HENT:  Head: Normocephalic.   Neck: Normal range of motion.  Respiratory: Effort normal.  Musculoskeletal: Normal range of motion.  Neurological: She is alert and oriented to person, place, and time.  Psychiatric: Her speech is normal and behavior is normal. Thought  content normal. Her mood appears anxious. Her affect is blunt. Cognition and memory are impaired. She expresses impulsivity.    Review of Systems  Psychiatric/Behavioral: The patient is nervous/anxious.   All other systems reviewed and are negative.   Blood pressure 128/84, pulse (!) 106, temperature 98.7 F (37.1 C), temperature source Oral, resp. rate 18, SpO2 96 %.There is no height or weight on file to calculate BMI.  General Appearance: Casual  Eye Contact:  Good  Speech:  Normal Rate  Volume:  Normal  Mood:  Anxious  Affect:  Blunt  Thought Process:  Coherent and Descriptions of Associations: Intact  Orientation:  Full (Time, Place, and Person)  Thought Content:  WDL and Logical  Suicidal Thoughts:  No  Homicidal Thoughts:  No  Memory:  Immediate;   Good Recent;   Good Remote;   Good  Judgement:  Poor  Insight:  Shallow  Psychomotor Activity:  Normal  Concentration:  Concentration: Good and Attention Span: Good  Recall:  Good  Fund of Knowledge:  Fair  Language:  Good  Akathisia:  No  Handed:  Right  AIMS (if indicated):   N/A  Assets:  Housing Leisure Time Physical Health Resilience Social Support  ADL's:  Intact  Cognition:  Impaired,  Moderate  Sleep:   N/A     Demographic Factors:  NA  Loss Factors: NA  Historical Factors: Impulsivity  Risk Reduction Factors:   Sense of responsibility to family, Living with another person, especially a relative, Positive social support and Positive therapeutic relationship  Continued Clinical Symptoms:  Anxiety, mild  Cognitive Features That Contribute To Risk:  None    Suicide Risk:  Minimal: No identifiable suicidal ideation.  Patients presenting with no risk factors but  with morbid ruminations; may be classified as minimal risk based on the severity of the depressive symptoms   Plan Of Care/Follow-up recommendations:  Schizoaffective disorder, bipolar type: -Continue Clozaril 400 mg BID for psychosis -Continue Haldol 10 mg in the morning and 20 mg in the pm -Continue Depakote 250 mg BID for mood stabilization -Continue Cogentin 1 mg BID for EPS prophylaxis -Continue Ativan 1 mg BID for anxiety/agitation -Continue Trazodone 100 mg daily for agitation -Continue Ambien 10 mg qhs for insomnia  Activity:  as tolerated Diet:  heart healthy diet  Disposition: discharge to group home [name], NP 04/14/2018, 9:51 AM   Patient seen face-to-face for psychiatric evaluation, chart reviewed and case discussed with the physician extender and developed treatment plan. Reviewed the information documented and agree with the treatment plan.  Juanetta Beets, DO 04/14/18 3:30 PM

## 2018-05-07 ENCOUNTER — Emergency Department (HOSPITAL_COMMUNITY)
Admission: EM | Admit: 2018-05-07 | Discharge: 2018-05-08 | Disposition: A | Discharge status: Home / Self Care | Payer: Medicaid Other | Attending: Emergency Medicine | Admitting: Emergency Medicine

## 2018-05-07 DIAGNOSIS — Z87891 Personal history of nicotine dependence: Secondary | ICD-10-CM | POA: Diagnosis not present

## 2018-05-07 DIAGNOSIS — Z79899 Other long term (current) drug therapy: Secondary | ICD-10-CM | POA: Diagnosis not present

## 2018-05-07 DIAGNOSIS — R45851 Suicidal ideations: Secondary | ICD-10-CM | POA: Diagnosis not present

## 2018-05-07 DIAGNOSIS — F22 Delusional disorders: Secondary | ICD-10-CM

## 2018-05-07 DIAGNOSIS — E119 Type 2 diabetes mellitus without complications: Secondary | ICD-10-CM | POA: Insufficient documentation

## 2018-05-07 DIAGNOSIS — F259 Schizoaffective disorder, unspecified: Secondary | ICD-10-CM | POA: Insufficient documentation

## 2018-05-07 DIAGNOSIS — F4325 Adjustment disorder with mixed disturbance of emotions and conduct: Secondary | ICD-10-CM | POA: Diagnosis not present

## 2018-05-07 DIAGNOSIS — F258 Other schizoaffective disorders: Secondary | ICD-10-CM

## 2018-05-07 LAB — BASIC METABOLIC PANEL
Anion gap: 11 (ref 5–15)
BUN: 12 mg/dL (ref 6–20)
CO2: 22 mmol/L (ref 22–32)
Calcium: 9.1 mg/dL (ref 8.9–10.3)
Chloride: 108 mmol/L (ref 98–111)
Creatinine, Ser: 0.58 mg/dL (ref 0.44–1.00)
GFR calc Af Amer: >60 mL/min (ref >60)
Glucose, Bld: 147 mg/dL — ABNORMAL HIGH (ref 70–99)
Potassium: 3.5 mmol/L (ref 3.5–5.1)
Sodium: 141 mmol/L (ref 135–145)

## 2018-05-07 LAB — CBC WITH DIFFERENTIAL/PLATELET
Abs Immature Granulocytes: 0.03 10*3/uL (ref 0.00–0.07)
BASOS PCT: 0 %
Basophils Absolute: 0 10*3/uL (ref 0.0–0.1)
Eosinophils Absolute: 0.1 10*3/uL (ref 0.0–0.5)
Eosinophils Relative: 1 %
HCT: 35.9 % — ABNORMAL LOW (ref 36.0–46.0)
Hemoglobin: 10.9 g/dL — ABNORMAL LOW (ref 12.0–15.0)
Immature Granulocytes: 0 %
Lymphocytes Relative: 34 %
Lymphs Abs: 2.3 10*3/uL (ref 0.7–4.0)
MCH: 28.5 pg (ref 26.0–34.0)
MCHC: 30.4 g/dL (ref 30.0–36.0)
MCV: 93.7 fL (ref 80.0–100.0)
Monocytes Absolute: 0.3 10*3/uL (ref 0.1–1.0)
Monocytes Relative: 5 %
Neutro Abs: 4 10*3/uL (ref 1.7–7.7)
Neutrophils Relative %: 60 %
Platelets: 297 10*3/uL (ref 150–400)
RBC: 3.83 MIL/uL — ABNORMAL LOW (ref 3.87–5.11)
RDW: 13.3 % (ref 11.5–15.5)
WBC: 6.7 10*3/uL (ref 4.0–10.5)
nRBC: 0 % (ref 0.0–0.2)

## 2018-05-07 LAB — URINALYSIS, ROUTINE W REFLEX MICROSCOPIC
Bilirubin Urine: NEGATIVE
GLUCOSE, UA: NEGATIVE mg/dL
Hgb urine dipstick: NEGATIVE
Ketones, ur: NEGATIVE mg/dL
LEUKOCYTES UA: NEGATIVE
Nitrite: NEGATIVE
Protein, ur: NEGATIVE mg/dL
Specific Gravity, Urine: 1.017 (ref 1.005–1.030)
pH: 6 (ref 5.0–8.0)

## 2018-05-07 LAB — RAPID URINE DRUG SCREEN, HOSP PERFORMED
Amphetamines: NOT DETECTED
Barbiturates: NOT DETECTED
Benzodiazepines: POSITIVE — AB
Cocaine: NOT DETECTED
Opiates: NOT DETECTED
Tetrahydrocannabinol: NOT DETECTED

## 2018-05-07 LAB — I-STAT BETA HCG BLOOD, ED (MC, WL, AP ONLY): I-stat hCG, quantitative: <5 m[IU]/mL (ref <5)

## 2018-05-07 LAB — CBG MONITORING, ED: Glucose-Capillary: 94 mg/dL (ref 70–99)

## 2018-05-07 LAB — ETHANOL

## 2018-05-07 MED ORDER — TRAZODONE HCL 100 MG PO TABS
100.0000 mg | ORAL_TABLET | Freq: Every day | ORAL | Status: DC
  Administered 2018-05-08: 100 mg via ORAL
  Filled 2018-05-07: qty 1

## 2018-05-07 MED ORDER — CLOZAPINE 100 MG PO TABS
400.0000 mg | ORAL_TABLET | Freq: Two times a day (BID) | ORAL | Status: DC
  Administered 2018-05-07 – 2018-05-08 (×2): 400 mg via ORAL
  Filled 2018-05-07 (×3): qty 4

## 2018-05-07 MED ORDER — DIVALPROEX SODIUM ER 250 MG PO TB24
250.0000 mg | ORAL_TABLET | Freq: Two times a day (BID) | ORAL | Status: DC
  Administered 2018-05-07: 250 mg via ORAL
  Filled 2018-05-07: qty 1

## 2018-05-07 MED ORDER — HALOPERIDOL 5 MG PO TABS: 10.0000 mg | ORAL_TABLET | ORAL | Status: DC

## 2018-05-07 MED ORDER — FOLIC ACID 1 MG PO TABS
2.0000 mg | ORAL_TABLET | Freq: Every day | ORAL | Status: DC
  Administered 2018-05-08: 2 mg via ORAL
  Filled 2018-05-07: qty 2

## 2018-05-07 MED ORDER — ZOLPIDEM TARTRATE 10 MG PO TABS
10.0000 mg | ORAL_TABLET | Freq: Every day | ORAL | Status: DC
  Administered 2018-05-07: 10 mg via ORAL
  Filled 2018-05-07: qty 1

## 2018-05-07 MED ORDER — LISINOPRIL 10 MG PO TABS
10.0000 mg | ORAL_TABLET | Freq: Every day | ORAL | Status: DC
  Administered 2018-05-08: 10 mg via ORAL
  Filled 2018-05-07: qty 1

## 2018-05-07 MED ORDER — METFORMIN HCL ER 750 MG PO TB24
750.0000 mg | ORAL_TABLET | Freq: Every day | ORAL | Status: DC
  Filled 2018-05-07: qty 1

## 2018-05-07 MED ORDER — HYDROCHLOROTHIAZIDE 12.5 MG PO CAPS
12.5000 mg | ORAL_CAPSULE | Freq: Every day | ORAL | Status: DC
  Administered 2018-05-08: 12.5 mg via ORAL
  Filled 2018-05-07: qty 1

## 2018-05-07 MED ORDER — LORAZEPAM 1 MG PO TABS
1.0000 mg | ORAL_TABLET | Freq: Two times a day (BID) | ORAL | Status: DC
  Administered 2018-05-07 – 2018-05-08 (×2): 1 mg via ORAL
  Filled 2018-05-07 (×2): qty 1

## 2018-05-07 MED ORDER — CLONIDINE HCL 0.1 MG PO TABS
0.1000 mg | ORAL_TABLET | Freq: Every day | ORAL | Status: DC
  Administered 2018-05-07: 0.1 mg via ORAL
  Filled 2018-05-07: qty 1

## 2018-05-07 MED ORDER — BENZTROPINE MESYLATE 1 MG PO TABS
1.0000 mg | ORAL_TABLET | Freq: Two times a day (BID) | ORAL | Status: DC
  Administered 2018-05-07 – 2018-05-08 (×2): 1 mg via ORAL
  Filled 2018-05-07 (×2): qty 1

## 2018-05-07 MED ORDER — LISINOPRIL-HYDROCHLOROTHIAZIDE 10-12.5 MG PO TABS: 1.0000 | ORAL_TABLET | Freq: Every day | ORAL | Status: DC

## 2018-05-07 MED ORDER — HYDROXYZINE HCL 25 MG PO TABS
25.0000 mg | ORAL_TABLET | Freq: Every day | ORAL | Status: DC
  Administered 2018-05-07: 25 mg via ORAL
  Filled 2018-05-07: qty 1

## 2018-05-07 NOTE — ED Notes (Signed)
Pt continues to walk the hall. Try to redirect but just stares at us and walks.

## 2018-05-07 NOTE — ED Provider Notes (Signed)
[facility]   CSN: [id number] Arrival date & time: 05/07/18  1739     History   Chief Complaint Chief Complaint  Patient presents with  . Suicidal    HPI Brittany Velez is a 32 y.o. female.  HPI Presents with suicidal ideation. Patient arrives via police She is a withdrawn individual, hesitant to provide details of her HPI, but acknowledges suicidal thoughts, seems to be interacting with individuals were not physically present. She denies physical complaints, denies dyspnea, denies nausea. Level 5 caveat secondary to psychiatric state. Please officers report that the patient left her group home, was sitting in a vehicle that does not belong to her, when she was found by her group home workers. She reportedly told police she was no longer interested in being in the group home. Please report the patient was cooperative, withdrawn, but interactive in route.  Past Medical History:  Diagnosis Date  . Diabetes mellitus without complication (HCC)   . Retardation   . Schizo-affective schizophrenia [facility](HCC)     Patient Active Problem List   Diagnosis Date Noted  . 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 Schizophrenia, schizoaffective, chronic with acute exacerbation (HCC) 06/06/2014  . Auditory hallucination   . Visual hallucination     No past surgical history on file.   OB History   No obstetric history on file.      Home Medications    Prior to Admission medications   Medication Sig Start Date End Date Taking? Authorizing Provider  benztropine (COGENTIN) 1 MG tablet Take 1 mg by mouth 2 (two) times daily.    [provider]  cloNIDine (CATAPRES) 0.1 MG tablet Take 0.1 mg by mouth at bedtime.    [provider]  cloZAPine (CLOZARIL) 100 MG tablet Take 400 mg by mouth 2 (two) times daily.  02/05/18   [provider]  divalproex (DEPAKOTE ER) 500 MG 24 hr  tablet Take 250 mg by mouth 2 (two) times daily.     [provider]  folic acid (FOLVITE) 1 MG tablet Take 2 mg by mouth daily.     [provider]  haloperidol (HALDOL) 10 MG tablet Take 10-20 mg by mouth See admin instructions. Take 10 mg by mouth in the morning and 20 mg by mouth in the evening    [provider]  hydrOXYzine (ATARAX/VISTARIL) 25 MG tablet Take 25 mg by mouth at bedtime. 02/05/18   [provider]  lisinopril-hydrochlorothiazide (PRINZIDE,ZESTORETIC) 10-12.5 MG tablet Take 1 tablet by mouth daily.    [provider]  LORazepam (ATIVAN) 0.5 MG tablet Take 1 mg by mouth 2 (two) times daily.  02/05/18   [provider]  metFORMIN (GLUCOPHAGE-XR) 750 MG 24 hr tablet Take 750 mg by mouth at bedtime.     [provider]  traZODone (DESYREL) 100 MG tablet Take 100 mg by mouth daily after breakfast.     [provider]  zolpidem (AMBIEN) 10 MG tablet Take 10 mg by mouth at bedtime.    [provider]    Family History No family history on file.  Social History Social History   Tobacco Use  . Smoking status: Former Smoker    Types: Cigarettes  . Smokeless tobacco: Never Used  Substance Use Topics  . Alcohol use: No  . Drug use: No     Allergies   Patient has no known allergies.   Review of Systems  Review of Systems  Unable to perform ROS: Psychiatric disorder     Physical Exam Updated Vital Signs There were no vitals taken for this visit.  Physical Exam Vitals signs and nursing note reviewed.  Constitutional:      General: She is not in acute distress.    Appearance: She is well-developed.  HENT:     Head: Normocephalic and atraumatic.  Eyes:     Conjunctiva/sclera: Conjunctivae normal.  Cardiovascular:     Rate and Rhythm: Normal rate and regular rhythm.  Pulmonary:     Effort: Pulmonary effort is normal. No respiratory distress.     Breath sounds: Normal breath sounds. No  stridor.  Abdominal:     General: There is no distension.  Skin:    General: Skin is warm and dry.  Neurological:     Mental Status: She is alert and oriented to person, place, and time.     Cranial Nerves: No cranial nerve deficit.  Psychiatric:        Attention and Perception: She is inattentive. She perceives auditory hallucinations.        Behavior: Behavior is slowed and withdrawn.        Thought Content: Thought content is delusional. Thought content includes suicidal ideation. Thought content does not include suicidal plan.        Cognition and Memory: Cognition is impaired.      ED Treatments / Results  Labs (all labs ordered are listed, but only abnormal results are displayed) Labs Reviewed  BASIC METABOLIC PANEL  ETHANOL  CBC WITH DIFFERENTIAL/PLATELET  URINALYSIS, ROUTINE W REFLEX MICROSCOPIC  RAPID URINE DRUG SCREEN, HOSP PERFORMED  I-STAT BETA HCG BLOOD, ED ([facility], [facility], [facility] ONLY)    Procedures Procedures (including critical care time)  Medications Ordered in ED Medications  benztropine (COGENTIN) tablet 1 mg (has no administration in time range)  cloNIDine (CATAPRES) tablet 0.1 mg (has no administration in time range)  cloZAPine (CLOZARIL) tablet 400 mg (has no administration in time range)  divalproex (DEPAKOTE ER) 24 hr tablet 250 mg (has no administration in time range)  folic acid (FOLVITE) tablet 2 mg (has no administration in time range)  haloperidol (HALDOL) tablet 10-20 mg (has no administration in time range)  hydrOXYzine (ATARAX/VISTARIL) tablet 25 mg (has no administration in time range)  lisinopril-hydrochlorothiazide (PRINZIDE,ZESTORETIC) 10-12.5 MG per tablet 1 tablet (has no administration in time range)  LORazepam (ATIVAN) tablet 1 mg (has no administration in time range)  metFORMIN (GLUCOPHAGE-XR) 24 hr tablet 750 mg (has no administration in time range)  traZODone (DESYREL) tablet 100 mg (has no administration in time range)  zolpidem (AMBIEN)  tablet 10 mg (has no administration in time range)     Initial Impression / Assessment and Plan / ED Course  I have reviewed the triage vital signs and the nursing notes.  Pertinent labs & imaging results that were available during my care of the patient were reviewed by me and considered in my medical decision making (see chart for details).    Chart review notable for history of schizoaffective disorder, 2 prior ED visits in the past 6 months.  This patient presents with the need for medical evaluation due to ongoing psychiatric condition.  The patient's medical portion of the evaluation is generally reassuring, with no evidence of acute new pathology.  The patient has been medically cleared for further psychiatric evaluation.  Final Clinical Impressions(s) / ED Diagnoses  Suicidal ideation   Gerhard MunchLockwood, Aariel Ems, MD 05/07/18 2029

## 2018-05-07 NOTE — ED Notes (Signed)
Pt alert and cooperative. Pt denies any pain or discomfort at this time. Pt states " Im suicidal sometimes". Pt noted speaking to the ceiling. Pt denies hi. Pt contract to safety. Pt resting in bed at this time. Pt safe, will continue to monitor.

## 2018-05-07 NOTE — Progress Notes (Signed)
Per Nira ConnJason Berry, NP pt is recommended for continued observation for safety and stabilization and to be reassessed in the AM by psych. EDP Gerhard MunchLockwood, Robert, MD and pt's nurse Linus GalasEpperson, Robyn L, RN have been advised.

## 2018-05-07 NOTE — ED Notes (Signed)
TTS assessment in progress. 
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
## 2018-05-07 NOTE — ED Triage Notes (Signed)
Pt BIB [facility], claiming SI.

## 2018-05-08 DIAGNOSIS — F4325 Adjustment disorder with mixed disturbance of emotions and conduct: Secondary | ICD-10-CM

## 2018-05-08 MED ORDER — DIVALPROEX SODIUM ER 500 MG PO TB24: 500.0000 mg | ORAL_TABLET | Freq: Two times a day (BID) | ORAL | 0 refills | Status: DC

## 2018-05-08 MED ORDER — DIVALPROEX SODIUM ER 500 MG PO TB24
500.0000 mg | ORAL_TABLET | Freq: Two times a day (BID) | ORAL | Status: DC
  Administered 2018-05-08: 500 mg via ORAL
  Filled 2018-05-08: qty 1

## 2018-05-08 NOTE — Progress Notes (Signed)
Received report from nurse Robyn. Brittany Velez continued to sleep throughout the night without incident.

## 2018-05-08 NOTE — Progress Notes (Addendum)
CSW informed patient has been psychiatrically and medically cleared for discharge. CSW aware patient has a legal guardian, [name] Fears with [employer] [phone]. CSW attempted to contact Citron[place]Erica and update her of patient's disposition. CSW left voicemail for return call.   [facility] CSW attempted to speak with French Anaracy, [facility] [phone], regarding patient disposition. CSW left voicemail for return call.   9:18am- CSW spoke with [place], [employer], regarding patient disposition. Per Elonda Huskyassandra, patient's legal guardian is out of the office until Monday. Per Cassandra, patient is able to return back to group home. CSW continues to wait for return call from group home.  10:03am- CS[facility] to [facility] for a second time. CSW left voicemail for return call.   1:21pm- CSW spoke with Elonda HuskyCassandra again as CSW has made multiple attempts to reach [facility] through the number in the chart. Cassandra provided CSW a number for Mel[place]anna, [place] [phone]. CSW spoke to [place] who stated she could come pick up patient between 3:00 and 3:30. CSW has updated patient's RN.  Archie BalboaMackenzie Irwin, LCSWA  Clinical Social Work Department  [facility]  [phone]

## 2018-05-08 NOTE — BH Assessment (Signed)
[facility]  Per Juanetta BeetsJacqueline Norman, DO, this pt does not require psychiatric hospitalization at this time.  [facility] with [facility].  This has been included in pt's discharge instructions.  Archie BalboaMackenzie Irwin, CSW agrees to contact pt's guardian and her residential facility to arrange for pt's return to the community.  Pt's nurse, Morrie Sheldonshley, has been notified.  Doylene Canninghomas Valda Christenson, MA Triage Specialist [phone]

## 2018-05-08 NOTE — ED Notes (Signed)
Pt d/c back to group home per MD order. Discharge summary reviewed with pt, pt verbalizes understanding. RX provided. Pt denies SI/HI. Pt signed e-signature. Personal property returned. Pt ambulatory off unit with [facility].

## 2018-05-08 NOTE — ED Notes (Signed)
Informed by Pearson GrippeKenzie -SW, that group home will be here between 3:00 and 3:30 for pt discharge,
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
## 2018-05-08 NOTE — ED Notes (Signed)
Pt taking a shower 

## 2018-05-08 NOTE — Discharge Instructions (Signed)
For your behavioral health needs, you are advised to continue treatment with [facility]: ° °     [facility] °     [address] °     [place], [place] [address] °     [phone] °

## 2018-05-09 ENCOUNTER — Emergency Department (HOSPITAL_COMMUNITY)
Admission: EM | Admit: 2018-05-09 | Discharge: 2018-05-09 | Payer: Medicaid Other | Attending: Emergency Medicine | Admitting: Emergency Medicine

## 2018-05-09 ENCOUNTER — Encounter (HOSPITAL_COMMUNITY): Payer: Self-pay | Admitting: Emergency Medicine

## 2018-05-09 DIAGNOSIS — Z7984 Long term (current) use of oral hypoglycemic drugs: Secondary | ICD-10-CM | POA: Insufficient documentation

## 2018-05-09 DIAGNOSIS — F4325 Adjustment disorder with mixed disturbance of emotions and conduct: Secondary | ICD-10-CM | POA: Diagnosis not present

## 2018-05-09 DIAGNOSIS — F258 Other schizoaffective disorders: Secondary | ICD-10-CM

## 2018-05-09 DIAGNOSIS — F919 Conduct disorder, unspecified: Secondary | ICD-10-CM | POA: Diagnosis present

## 2018-05-09 DIAGNOSIS — F209 Schizophrenia, unspecified: Secondary | ICD-10-CM | POA: Insufficient documentation

## 2018-05-09 DIAGNOSIS — Z79899 Other long term (current) drug therapy: Secondary | ICD-10-CM | POA: Diagnosis not present

## 2018-05-09 DIAGNOSIS — E119 Type 2 diabetes mellitus without complications: Secondary | ICD-10-CM | POA: Insufficient documentation

## 2018-05-09 DIAGNOSIS — Z87891 Personal history of nicotine dependence: Secondary | ICD-10-CM | POA: Insufficient documentation

## 2018-05-09 DIAGNOSIS — Z046 Encounter for general psychiatric examination, requested by authority: Secondary | ICD-10-CM | POA: Diagnosis not present

## 2018-05-09 DIAGNOSIS — F259 Schizoaffective disorder, unspecified: Secondary | ICD-10-CM
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
## 2018-05-09 NOTE — [facility] Suicide Risk Assessment (Signed)
Suicide Risk Assessment  Discharge Assessment   [facility]   Principal Problem: Adjustment disorder with mixed disturbance of emotions and conduct Discharge Diagnoses: Principal Problem:   Adjustment disorder with mixed disturbance of emotions and conduct Active Problems:   Schizophrenia, schizoaffective, chronic with acute exacerbation (HCC)   Total Time spent with patient: 45 minutes  Musculoskeletal: Strength & Muscle Tone: within normal limits Gait & Station: normal Patient leans: N/A  Psychiatric Specialty Exam:   Blood pressure 112/79, pulse (!) 119, temperature 98.6 F (37 C), temperature source Oral, SpO2 97 %.There is no height or weight on file to calculate BMI.  General Appearance: Casual  Eye Contact::  Good  Speech:  Normal Rate409  Volume:  Normal  Mood:  Anxious, mild  Affect:  Flat  Thought Process:  Coherent and Descriptions of Associations: Intact  Orientation:  Full (Time, Place, and Person)  Thought Content:  WDL and Logical  Suicidal Thoughts:  No  Homicidal Thoughts:  No  Memory:  Immediate;   Fair Recent;   Fair Remote;   Fair  Judgement:  Fair  Insight:  Fair  Psychomotor Activity:  Normal  Concentration:  Fair  Recall:  [employer]  Fund of Knowledge:Fair  Language: Fair  Akathisia:  No  Handed:  Right  AIMS (if indicated):     Assets:  Housing Leisure Time Physical Health Resilience Social Support  Sleep:     Cognition: Impaired,  Mild  ADL's:  Intact   Mental Status Per Nursing Assessment::   On Admission:   32 yo female who presented to the ED after destroying property at a convenience store.  When the police came, she said she was suicidal.  Bonita QuinLinda is well known to the psychiatric providers for presenting to the ED when she has conflict at her group home of ten years and acts out or says suicidal ideations to come to the ED.  She is at her baseline and now wants to return to her group home after she admitted she  knows it was not right to destroy property. More cognitive of her actions than she sometimes act.  No suicidal/homicidal ideations, hallucinations, or substance abuse.  Stable to go to jail for charges and follow up with her ACT team.  Demographic Factors:  NA  Loss Factors: Legal issues  Historical Factors: Impulsivity  Risk Reduction Factors:   Sense of responsibility to family, Living with another person, especially a relative, Positive social support and Positive therapeutic relationship  Continued Clinical Symptoms:  Anxiety, mild  Cognitive Features That Contribute To Risk:  None    Suicide Risk:  Minimal: No identifiable suicidal ideation.  Patients presenting with no risk factors but with morbid ruminations; may be classified as minimal risk based on the severity of the depressive symptoms  Follow-up Information    Your Doctor.   Why:  Please call your doctor for follow up in 2-3 days if not improved       [facility]-EMERGENCY DEPT.   Specialty:  Emergency Medicine Why:  Return to ER for any new or worsening symptoms Contact information: [address] [id number] [address] [place] [id number] [phone]          Plan Of Care/Follow-up recommendations:  Activity:  as tolerated Diet:  heart healthy diet  LORD, JAMISON, NP 05/09/2018, 10:37 AM

## 2018-05-09 NOTE — ED Provider Notes (Signed)
[facility]-EMERGENCY DEPT Provider Note   CSN: [id number] Arrival date & time: 05/09/18  1014     History   Chief Complaint Chief Complaint  Patient presents with  . behavioral issue    HPI Brittany Velez is a 32 y.o. female.  HPI 32 yo female with a  Hx of DM, MR and schizoaffective disorder presents to ER after reportedly vandalizing property at a store. She states she became frustrated with the group home and took out her anger in the store. No HI or SI. No medical complaints. [facility] and sent home yesterday from the ER.    Past Medical History:  Diagnosis Date  . Diabetes mellitus without complication (HCC)   . Retardation   . Schizo-affective schizophrenia [facility](HCC)     Patient Active Problem List   Diagnosis Date Noted  . 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 Schizophrenia, schizoaffective, chronic with acute exacerbation (HCC) 06/06/2014  . Auditory hallucination   . Visual hallucination     History reviewed. No pertinent surgical history.   OB History   No obstetric history on file.      Home Medications    Prior to Admission medications   Medication Sig Start Date End Date Taking? Authorizing Provider  benztropine (COGENTIN) 1 MG tablet Take 1 mg by mouth 2 (two) times daily.    [provider]  cloNIDine (CATAPRES) 0.1 MG tablet Take 0.1 mg by mouth at bedtime.    [provider]  cloZAPine (CLOZARIL) 100 MG tablet Take 400 mg by mouth 2 (two) times daily.  02/05/18   [provider]  divalproex (DEPAKOTE ER) 500 MG 24 hr tablet Take 1 tablet (500 mg total) by mouth 2 (two) times daily. 05/08/18   Charm RingsLord, Jamison Y, NP  folic acid (FOLVITE) 1 MG tablet Take 2 mg by mouth daily.     [provider]  haloperidol (HALDOL) 10 MG tablet Take 10-20 mg by mouth See admin instructions. Take 10 mg by mouth in the morning and 20 mg by mouth at  bedtime    [provider]  hydrOXYzine (ATARAX/VISTARIL) 25 MG tablet Take 25 mg by mouth at bedtime. 02/05/18   [provider]  lisinopril-hydrochlorothiazide (PRINZIDE,ZESTORETIC) 10-12.5 MG tablet Take 1 tablet by mouth daily.    [provider]  LORazepam (ATIVAN) 1 MG tablet Take 1 mg by mouth 2 (two) times daily.    [provider]  metFORMIN (GLUCOPHAGE-XR) 750 MG 24 hr tablet Take 750 mg by mouth daily after supper.     [provider]  traZODone (DESYREL) 100 MG tablet Take 100 mg by mouth at bedtime.     [provider]  Valbenazine Tosylate (INGREZZA) 80 MG CAPS Take 80 mg by mouth at bedtime.    [provider]  zolpidem (AMBIEN) 10 MG tablet Take 10 mg by mouth at bedtime as needed for sleep.     [provider]    Family History No family history on file.  Social History Social History   Tobacco Use  . Smoking status: Former Smoker    Types: Cigarettes  . Smokeless tobacco: Never Used  Substance Use Topics  . Alcohol use: No  . Drug use: No     Allergies   Patient has no known allergies.   Review of Systems Review of Systems  All other systems reviewed and are negative.    Physical  Exam Updated Vital Signs There were no vitals taken for this visit.  Physical Exam Vitals signs and nursing note reviewed.  Constitutional:      Appearance: She is well-developed.  HENT:     Head: Normocephalic.  Neck:     Musculoskeletal: Normal range of motion.  Pulmonary:     Effort: Pulmonary effort is normal.  Abdominal:     General: There is no distension.  Musculoskeletal: Normal range of motion.  Skin:    General: Skin is warm.  Neurological:     Mental Status: She is alert and oriented to person, place, and time.  Psychiatric:     Comments: Calm and cooperative. No pressured speech. No SI or HI      ED Treatments / Results  Labs (all labs ordered are listed, but only abnormal  results are displayed) Labs Reviewed - No data to display  EKG None  Radiology No results found.  Procedures Procedures (including critical care time)  Medications Ordered in ED Medications - No data to display   Initial Impression / Assessment and Plan / ED Course  I have reviewed the triage vital signs and the nursing notes.  Pertinent labs & imaging results that were available during my care of the patient were reviewed by me and considered in my medical decision making (see chart for details).     Schizoaffective disorder. Medically cleared. No indication for psychiatric assessment. I do not believe the patient is a threat to herself at this time. Police taking the patient to jail.   Final Clinical Impressions(s) / ED Diagnoses   Final diagnoses:  None    ED Discharge Orders    None       Azalia Bilisampos, Norton Bivins, MD 05/09/18 1032

## 2018-06-16 ENCOUNTER — Emergency Department (HOSPITAL_COMMUNITY)
Admission: EM | Admit: 2018-06-16 | Discharge: 2018-06-17 | Disposition: A | Discharge status: Home / Self Care | Payer: Medicaid Other | Attending: Emergency Medicine | Admitting: Emergency Medicine

## 2018-06-16 ENCOUNTER — Encounter (HOSPITAL_COMMUNITY): Payer: Self-pay | Admitting: Emergency Medicine

## 2018-06-16 DIAGNOSIS — Z87891 Personal history of nicotine dependence: Secondary | ICD-10-CM | POA: Diagnosis not present

## 2018-06-16 DIAGNOSIS — F258 Other schizoaffective disorders: Secondary | ICD-10-CM | POA: Diagnosis not present

## 2018-06-16 DIAGNOSIS — F329 Major depressive disorder, single episode, unspecified: Secondary | ICD-10-CM | POA: Insufficient documentation

## 2018-06-16 DIAGNOSIS — R45851 Suicidal ideations: Secondary | ICD-10-CM | POA: Insufficient documentation

## 2018-06-16 DIAGNOSIS — Z79899 Other long term (current) drug therapy: Secondary | ICD-10-CM | POA: Insufficient documentation

## 2018-06-16 DIAGNOSIS — F259 Schizoaffective disorder, unspecified: Secondary | ICD-10-CM

## 2018-06-16 DIAGNOSIS — F4325 Adjustment disorder with mixed disturbance of emotions and conduct: Secondary | ICD-10-CM | POA: Diagnosis not present

## 2018-06-16 LAB — DIFFERENTIAL
Basophils Absolute: 0 10*3/uL (ref 0.0–0.1)
Basophils Relative: 0 %
Eosinophils Absolute: 0 10*3/uL (ref 0.0–0.5)
Eosinophils Relative: 1 %
Lymphocytes Relative: 19 %
Lymphs Abs: 1.2 10*3/uL (ref 0.7–4.0)
MONO ABS: 0.4 10*3/uL (ref 0.1–1.0)
Monocytes Relative: 6 %
Neutro Abs: 4.6 10*3/uL (ref 1.7–7.7)
Neutrophils Relative %: 74 %

## 2018-06-16 LAB — COMPREHENSIVE METABOLIC PANEL
ALK PHOS: 49 U/L (ref 38–126)
ALT: 11 U/L (ref 0–44)
AST: 15 U/L (ref 15–41)
Albumin: 4.1 g/dL (ref 3.5–5.0)
Anion gap: 8 (ref 5–15)
BUN: 12 mg/dL (ref 6–20)
CO2: 24 mmol/L (ref 22–32)
Calcium: 9 mg/dL (ref 8.9–10.3)
Chloride: 109 mmol/L (ref 98–111)
Creatinine, Ser: 0.63 mg/dL (ref 0.44–1.00)
GFR calc Af Amer: >60 mL/min (ref >60)
GFR calc non Af Amer: >60 mL/min (ref >60)
GLUCOSE: 114 mg/dL — AB (ref 70–99)
Potassium: 3.6 mmol/L (ref 3.5–5.1)
SODIUM: 141 mmol/L (ref 135–145)
Total Bilirubin: 0.2 mg/dL — ABNORMAL LOW (ref 0.3–1.2)
Total Protein: 7.4 g/dL (ref 6.5–8.1)

## 2018-06-16 LAB — CBC
HCT: 33.5 % — ABNORMAL LOW (ref 36.0–46.0)
Hemoglobin: 10.1 g/dL — ABNORMAL LOW (ref 12.0–15.0)
MCH: 28.9 pg (ref 26.0–34.0)
MCHC: 30.1 g/dL (ref 30.0–36.0)
MCV: 95.7 fL (ref 80.0–100.0)
Platelets: 235 10*3/uL (ref 150–400)
RBC: 3.5 MIL/uL — AB (ref 3.87–5.11)
RDW: 13.7 % (ref 11.5–15.5)
WBC: 6.1 10*3/uL (ref 4.0–10.5)
nRBC: 0 % (ref 0.0–0.2)

## 2018-06-16 LAB — RAPID URINE DRUG SCREEN, HOSP PERFORMED
AMPHETAMINES: NOT DETECTED
Barbiturates: NOT DETECTED
Benzodiazepines: NOT DETECTED
Cocaine: NOT DETECTED
Opiates: NOT DETECTED
Tetrahydrocannabinol: NOT DETECTED

## 2018-06-16 LAB — I-STAT BETA HCG BLOOD, ED (MC, WL, AP ONLY): I-stat hCG, quantitative: <5 m[IU]/mL (ref <5)

## 2018-06-16 LAB — ACETAMINOPHEN LEVEL: Acetaminophen (Tylenol), Serum: <10 ug/mL — ABNORMAL LOW (ref 10–30)

## 2018-06-16 LAB — SALICYLATE LEVEL: Salicylate Lvl: <7 mg/dL (ref 2.8–30.0)

## 2018-06-16 LAB — VALPROIC ACID LEVEL: Valproic Acid Lvl: 34 ug/mL — ABNORMAL LOW (ref 50.0–100.0)

## 2018-06-16 LAB — ETHANOL: Alcohol, Ethyl (B): <10 mg/dL (ref <10)

## 2018-06-16 MED ORDER — CLONIDINE HCL 0.1 MG PO TABS
0.1000 mg | ORAL_TABLET | Freq: Every day | ORAL | Status: DC
  Administered 2018-06-16: 0.1 mg via ORAL
  Filled 2018-06-16: qty 1

## 2018-06-16 MED ORDER — HYDROXYZINE HCL 25 MG PO TABS
25.0000 mg | ORAL_TABLET | Freq: Every day | ORAL | Status: DC
  Administered 2018-06-16: 25 mg via ORAL
  Filled 2018-06-16: qty 1

## 2018-06-16 MED ORDER — LISINOPRIL 10 MG PO TABS
10.0000 mg | ORAL_TABLET | Freq: Every day | ORAL | Status: DC
  Administered 2018-06-16 – 2018-06-17 (×2): 10 mg via ORAL
  Filled 2018-06-16 (×2): qty 1

## 2018-06-16 MED ORDER — LORAZEPAM 1 MG PO TABS
1.0000 mg | ORAL_TABLET | Freq: Two times a day (BID) | ORAL | Status: DC
  Administered 2018-06-16 – 2018-06-17 (×3): 1 mg via ORAL
  Filled 2018-06-16 (×3): qty 1

## 2018-06-16 MED ORDER — HALOPERIDOL 5 MG PO TABS: 10.0000 mg | ORAL_TABLET | ORAL | Status: DC

## 2018-06-16 MED ORDER — CLOZAPINE 100 MG PO TABS
400.0000 mg | ORAL_TABLET | Freq: Two times a day (BID) | ORAL | Status: DC
  Administered 2018-06-16 – 2018-06-17 (×2): 400 mg via ORAL
  Filled 2018-06-16 (×5): qty 4

## 2018-06-16 MED ORDER — METFORMIN HCL ER 750 MG PO TB24
750.0000 mg | ORAL_TABLET | Freq: Every day | ORAL | Status: DC
  Administered 2018-06-16: 750 mg via ORAL
  Filled 2018-06-16 (×2): qty 1

## 2018-06-16 MED ORDER — LISINOPRIL-HYDROCHLOROTHIAZIDE 10-12.5 MG PO TABS: 1.0000 | ORAL_TABLET | Freq: Every day | ORAL | Status: DC

## 2018-06-16 MED ORDER — ZOLPIDEM TARTRATE 10 MG PO TABS: 10.0000 mg | ORAL_TABLET | Freq: Every evening | ORAL | Status: DC | PRN

## 2018-06-16 MED ORDER — TRAZODONE HCL 100 MG PO TABS
100.0000 mg | ORAL_TABLET | Freq: Every day | ORAL | Status: DC
  Administered 2018-06-16: 100 mg via ORAL
  Filled 2018-06-16: qty 1

## 2018-06-16 MED ORDER — BENZTROPINE MESYLATE 1 MG PO TABS
1.0000 mg | ORAL_TABLET | Freq: Two times a day (BID) | ORAL | Status: DC
  Administered 2018-06-16 – 2018-06-17 (×3): 1 mg via ORAL
  Filled 2018-06-16 (×4): qty 1

## 2018-06-16 MED ORDER — DIVALPROEX SODIUM ER 500 MG PO TB24
500.0000 mg | ORAL_TABLET | Freq: Two times a day (BID) | ORAL | Status: DC
  Administered 2018-06-16 – 2018-06-17 (×3): 500 mg via ORAL
  Filled 2018-06-16 (×4): qty 1

## 2018-06-16 MED ORDER — HYDROCHLOROTHIAZIDE 12.5 MG PO CAPS
12.5000 mg | ORAL_CAPSULE | Freq: Every day | ORAL | Status: DC
  Administered 2018-06-16 – 2018-06-17 (×2): 12.5 mg via ORAL
  Filled 2018-06-16 (×2): qty 1

## 2018-06-16 MED ORDER — VALBENAZINE TOSYLATE 80 MG PO CAPS: 80.0000 mg | ORAL_CAPSULE | Freq: Every day | ORAL | Status: DC

## 2018-06-16 MED ORDER — FOLIC ACID 1 MG PO TABS
2.0000 mg | ORAL_TABLET | Freq: Every day | ORAL | Status: DC
  Administered 2018-06-16 – 2018-06-17 (×2): 2 mg via ORAL
  Filled 2018-06-16 (×2): qty 2

## 2018-06-16 NOTE — ED Notes (Signed)
Pt getting physically aggressive with group home worker. Made Dr Criss Alvine aware. [employer] at bedside.

## 2018-06-16 NOTE — ED Notes (Signed)
Brittany Velez-Group Home Owner [phone]  Pt picked up from day program and she tried to jump out of van 3x, pt sts she is suicidial and verbally aggressive and pushing group home workers

## 2018-06-16 NOTE — Progress Notes (Signed)
Pharmacy Note: [facility]apine Program Uppdate  Patient & MD are registered; most recent labs on file were acceptable.   Per Clozapine [facility] program, acceptable to continue medication.  Current ANC monitoring frequency for this patient is weekly.  WBC result from today is WNL; have ordered an add-on differential for evaluation of ANC; results are pending.  Elie Goody, PharmD, BCPS [phone] 06/16/2018  4:40 PM

## 2018-06-16 NOTE — ED Notes (Signed)
Restless, wandering halls and needs to be redirected. She does follow directions. She has a staring and flat affect. Slow speech. Asked Probation officer if she could ask me a question, told her yes, then she never asked a question or spoke to me for another half an hour. She is able to get her needs met, and she frequently asks for food which needs to be limited because she is a diabetic. No behavior issues to this point. Unable to assess if she is having psychotic sx.

## 2018-06-16 NOTE — Progress Notes (Signed)
Patient is well known to this ED for getting upset and doing impulsive things.  IDD and low threshold for frustration.  She likes getting snacks here because at the group home healthy foods are available 24/7 and she likes the non-healthy choices here.  Boundaries set with her but still a bit upset, discussed with her caregiver.  The plan is to provider healthy 3 meals with no "junk" food to decrease positive reinforcement.  If the issue of discharge is pushed at this time, she will act out.  Discussed the plan for her to return to the group home tomorrow as she is under constant supervision and sees Dr A for outpatient.  If she were to try something, staff is with her.  Not actively suicidal at this time.    [name], PMHNP

## 2018-06-16 NOTE — ED Provider Notes (Signed)
[facility]-EMERGENCY DEPT Provider Note   CSN: [id number] Arrival date & time: 06/16/18  1438     History   Chief Complaint Chief Complaint  Patient presents with  . Suicidal  . Aggressive Behavior    HPI Brittany Velez is a 33 y.o. female. Level 5 caveat due to psychiatric disorder. HPI Brought in from group home.  Reportedly tried to jump out of the [place] 3 times.  States she was running away.  States she is suicidal.  States she was at that time and still is suicidal.  Denies substance use.  States she is been taking her medicine.  Has had previous visits for the same. Past Medical History:  Diagnosis Date  . Diabetes mellitus without complication (HCC)   . Retardation   . Schizo-affective schizophrenia [facility])     Patient Active Problem List   Diagnosis Date Noted  . 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 Schizophrenia, schizoaffective, chronic with acute exacerbation (HCC) 06/06/2014  . Auditory hallucination   . Visual hallucination     History reviewed. No pertinent surgical history.   OB History   No obstetric history on file.      Home Medications    Prior to Admission medications   Medication Sig Start Date End Date Taking? Authorizing Provider  benztropine (COGENTIN) 1 MG tablet Take 1 mg by mouth 2 (two) times daily.    [provider]  cloNIDine (CATAPRES) 0.1 MG tablet Take 0.1 mg by mouth at bedtime.    [provider]  cloZAPine (CLOZARIL) 100 MG tablet Take 400 mg by mouth 2 (two) times daily.  02/05/18   [provider]  divalproex (DEPAKOTE ER) 500 MG 24 hr tablet Take 1 tablet (500 mg total) by mouth 2 (two) times daily. 05/08/18   Charm Rings, NP  folic acid (FOLVITE) 1 MG tablet Take 2 mg by mouth daily.     [provider]  haloperidol (HALDOL) 10 MG tablet Take 10-20 mg by mouth See admin instructions. Take 10 mg by mouth in the morning and 20  mg by mouth at bedtime    [provider]  hydrOXYzine (ATARAX/VISTARIL) 25 MG tablet Take 25 mg by mouth at bedtime. 02/05/18   [provider]  lisinopril-hydrochlorothiazide (PRINZIDE,ZESTORETIC) 10-12.5 MG tablet Take 1 tablet by mouth daily.    [provider]  LORazepam (ATIVAN) 1 MG tablet Take 1 mg by mouth 2 (two) times daily.    [provider]  metFORMIN (GLUCOPHAGE-XR) 750 MG 24 hr tablet Take 750 mg by mouth daily after supper.     [provider]  traZODone (DESYREL) 100 MG tablet Take 100 mg by mouth at bedtime.     [provider]  Valbenazine Tosylate (INGREZZA) 80 MG CAPS Take 80 mg by mouth at bedtime.    [provider]  zolpidem (AMBIEN) 10 MG tablet Take 10 mg by mouth at bedtime as needed for sleep.     [provider]    Family History No family history on file.  Social History Social History   Tobacco Use  . Smoking status: Former Smoker    Types: Cigarettes  . Smokeless tobacco: Never Used  Substance Use Topics  . Alcohol use: No  . Drug use: No     Allergies   Patient has no known allergies.   Review of Systems Review of Systems  Unable to perform ROS: Psychiatric disorder  Physical Exam Updated Vital Signs BP 134/89 (BP Location: Right Arm)   Pulse (!) 120   Temp 98.7 F (37.1 C) (Oral)   Resp 18   SpO2 100%   Physical Exam HENT:     Head: Atraumatic.  Eyes:     Pupils: Pupils are equal, round, and reactive to light.  Cardiovascular:     Rate and Rhythm: Normal rate and regular rhythm.  Pulmonary:     Breath sounds: Normal breath sounds.  Abdominal:     Palpations: Abdomen is soft.  Musculoskeletal:        General: No tenderness.  Skin:    General: Skin is warm.  Neurological:     Mental Status: She is alert. Mental status is at baseline.      ED Treatments / Results  Labs (all labs ordered are listed, but only abnormal results are displayed) Labs  Reviewed  CBC - Abnormal; Notable for the following components:      Result Value   RBC 3.50 (*)    Hemoglobin 10.1 (*)    HCT 33.5 (*)    All other components within normal limits  COMPREHENSIVE METABOLIC PANEL  ETHANOL  SALICYLATE LEVEL  ACETAMINOPHEN LEVEL  RAPID URINE DRUG SCREEN, HOSP PERFORMED  VALPROIC ACID LEVEL  I-STAT BETA HCG BLOOD, ED ([facility], [facility], [facility] ONLY)    EKG None  Radiology No results found.  Procedures Procedures (including critical care time)  Medications Ordered in ED Medications  benztropine (COGENTIN) tablet 1 mg (has no administration in time range)  cloNIDine (CATAPRES) tablet 0.1 mg (has no administration in time range)  cloZAPine (CLOZARIL) tablet 400 mg (has no administration in time range)  divalproex (DEPAKOTE ER) 24 hr tablet 500 mg (has no administration in time range)  folic acid (FOLVITE) tablet 2 mg (has no administration in time range)  haloperidol (HALDOL) tablet 10-20 mg (has no administration in time range)  hydrOXYzine (ATARAX/VISTARIL) tablet 25 mg (has no administration in time range)  lisinopril-hydrochlorothiazide (PRINZIDE,ZESTORETIC) 10-12.5 MG per tablet 1 tablet (has no administration in time range)  LORazepam (ATIVAN) tablet 1 mg (has no administration in time range)  metFORMIN (GLUCOPHAGE-XR) 24 hr tablet 750 mg (has no administration in time range)  traZODone (DESYREL) tablet 100 mg (has no administration in time range)  Valbenazine Tosylate CAPS 80 mg (has no administration in time range)  zolpidem (AMBIEN) tablet 10 mg (has no administration in time range)     Initial Impression / Assessment and Plan / ED Course  I have reviewed the triage vital signs and the nursing notes.  Pertinent labs & imaging results that were available during my care of the patient were reviewed by me and considered in my medical decision making (see chart for details).     Patient presents with depression and acting out.  History of same.  Lab  work pending but at this point likely medically cleared.  To be seen by TTS  Lab work all back.  Patient is medically cleared.  Final Clinical Impressions(s) / ED Diagnoses   Final diagnoses:  Suicidal ideation    ED Discharge Orders    None       Benjiman Core, MD 06/16/18 1719

## 2018-06-16 NOTE — BH Assessment (Signed)
[facility] Assessment Progress Note   This Clinical research associate also staffed case with [name] who recommended patient be observed and monitored for safety as noted above.

## 2018-06-16 NOTE — ED Notes (Signed)
Bed: [facility] Expected date:  Expected time:  Means of arrival:  Comments: 

## 2018-06-16 NOTE — BH Assessment (Addendum)
Assessment Note  Brittany Velez is an 33 y.o. female that presents this date voluntary after attempting to jump out of her group home Brittany Velez today. Patient renders conflicting history denying any S/I at the time of assessment although per admission note, patient reported that gesture (attempting to jump out of Brittany Velez) was a attempt at self harm. Patient informed this Clinical research associate that she was "just trying to get away." Patient renders limited history and speaks in a low soft voice as she interacts with this Clinical research associate. Patient denies any H/I or AVH. Patient will answer only select questions and just stares at this writer during most of the assessment. This Clinical research associate is unsure if patient is comprehending the content of this writer's questions. This writer attempts to re-frame questions unsuccessfully. Information to complete assessment was obtained from admission notes and history. Per notes, patient attempted to jump out of her group home van 3 times earlier this date. Patient states she was running away. Patient reports on admission that this act was a attempt at self harm although denies during assessment. Patient denies substance use and nods her head yes when asked if she is currently compliant with her medication regimen. Patient is well known to [facility] having previous visits for the same.Brittany Velez [phone] is patient's group home owner and Brittany Velez from [facility] is patient's guardian. Patient is a poor historian and answered only very few questions during the assessment. Patient presents quiet/awake in scrubs with soft speech. During the assessment patient rarely answered questions and stared blankly at clinician. Patient's affect was preoccupied. Patient's thought process was circumstantial. Patient's  judgement was impaired. Lord Velez also evaluated patient and noted "Patient is well known to this ED for getting upset and doing impulsive things. IDD and low threshold for frustration. She likes getting snacks  here because at the group home healthy foods are available 24/7 and she likes the non-healthy choices here. Boundaries set with her but still a bit upset, discussed with her caregiver. The plan is to provider healthy 3 meals with no "junk" food to decrease positive reinforcement. If the issue of discharge is pushed at this time, she will act out. Discussed the plan for her to return to the group home tomorrow as she is under constant supervision and sees Dr Brittany Velez for outpatient. Not actively suicidal at this time". This Clinical research associate also staffed case with Brittany Velez who recommended patient be observed and monitored for safety as noted above.        Diagnosis: F25.0 Schizoaffective, Bipolar type  Past Medical History:  Past Medical History:  Diagnosis Date  . Diabetes mellitus without complication (HCC)   . Retardation   . Schizo-affective schizophrenia (HCC)     History reviewed. No pertinent surgical history.  Family History: No family history on file.  Social History:  reports that she has quit smoking. Her smoking use included cigarettes. She has never used smokeless tobacco. She reports that she does not drink alcohol or use drugs.  Additional Social History:  Alcohol / Drug Use Pain Medications: See MAR Prescriptions: See MAR Over the Counter: See MAR History of alcohol / drug use?: No history of alcohol / drug abuse Longest period of sobriety (when/how long): NA Negative Consequences of Use: (NA) Withdrawal Symptoms: (NA)  CIWA: CIWA-Ar BP: 134/89 Pulse Rate: (!) 120 COWS:    Allergies: No Known Allergies  Home Medications: (Not in a hospital admission)   OB/GYN Status:  No LMP recorded.  General Assessment Data Location of  Assessment: [facility] TTS Assessment: In system Is this a Tele or Face-to-Face Assessment?: Face-to-Face Is this an Initial Assessment or a Re-assessment for this encounter?: Initial Assessment Patient Accompanied by:: (na) Language Other than  English: No Living Arrangements: Other (Comment)(Group home) What gender do you identify as?: Female Marital status: Single Pregnancy Status: No Living Arrangements: Group Home Can pt return to current living arrangement?: Yes Admission Status: Voluntary Is patient capable of signing voluntary admission?: Yes Referral Source: Other(Group home staff) Insurance type: MCD  Medical Screening Exam [facility] Walk-in ONLY) Medical Exam completed: Yes  Crisis Care Plan Living Arrangements: Group Home Legal Guardian: Other:([facility]) Name of Psychiatrist: Akintayo Velez Name of Therapist: None  Education Status Is patient currently in school?: No Is the patient employed, unemployed or receiving disability?: Receiving disability income  Risk to self with the past 6 months Suicidal Ideation: No Has patient been a risk to self within the past 6 months prior to admission? : Yes Suicidal Intent: No Has patient had any suicidal intent within the past 6 months prior to admission? : Yes Is patient at risk for suicide?: Yes Suicidal Plan?: No Has patient had any suicidal plan within the past 6 months prior to admission? : No Access to Means: No What has been your use of drugs/alcohol within the last 12 months?: Denies Previous Attempts/Gestures: Yes How many times?: (Multiple) Other Self Harm Risks: (NA) Triggers for Past Attempts: Other (Comment)(Problems at group home) Intentional Self Injurious Behavior: None Family Suicide History: No Recent stressful life event(s): Conflict (Comment)(Problems at group home) Persecutory voices/beliefs?: No Depression: (Pt unable to voice symptoms) Depression Symptoms: (Pt unable to voice symptoms) Substance abuse history and/or treatment for substance abuse?: No Suicide prevention information given to non-admitted patients: Not applicable  Risk to Others within the past 6 months Homicidal Ideation: No Does patient have any lifetime risk of  violence toward others beyond the six months prior to admission? : No Thoughts of Harm to Others: No Current Homicidal Intent: No Current Homicidal Plan: No Access to Homicidal Means: No Identified Victim: NA History of harm to others?: No Assessment of Violence: None Noted Violent Behavior Description: NA Does patient have access to weapons?: No Criminal Charges Pending?: No Does patient have a court date: No Is patient on probation?: No  Psychosis Hallucinations: None noted Delusions: None noted  Mental Status Report Appearance/Hygiene: In scrubs Eye Contact: Poor Motor Activity: Freedom of movement Speech: Soft, Slow, Slurred Level of Consciousness: Quiet/awake Mood: Preoccupied Affect: Blunted Anxiety Level: Minimal Thought Processes: Thought Blocking Judgement: Impaired Orientation: Place Obsessive Compulsive Thoughts/Behaviors: None  Cognitive Functioning Concentration: Decreased Memory: Unable to Assess Is patient IDD: No Insight: Poor Impulse Control: Unable to Assess Appetite: (UTA) Have you had any weight changes? : (UTA) Sleep: (UTA) Total Hours of Sleep: (UTA) Vegetative Symptoms: None  ADLScreening [facility] Assessment Services) Patient's cognitive ability adequate to safely complete daily activities?: Yes Patient able to express need for assistance with ADLs?: Yes Independently performs ADLs?: Yes (appropriate for developmental age)  Prior Inpatient Therapy Prior Inpatient Therapy: Yes Prior Therapy Dates: (Multiple) [facility] Reason for Treatment: MH issues  Prior Outpatient Therapy Prior Outpatient Therapy: Yes Prior Therapy Dates: Ongoing Prior Therapy Facilty/Provider(s): Akintayo Velez Reason for Treatment: Med mang Does patient have an ACCT team?: No Does patient have Intensive In-House Services?  : No Does patient have [facility] services? : No Does patient have P4CC services?: No  ADL Screening (condition  at time of admission)  Patient's cognitive ability adequate to safely complete daily activities?: Yes Is the patient deaf or have difficulty hearing?: No Does the patient have difficulty seeing, even when wearing glasses/contacts?: No Does the patient have difficulty concentrating, remembering, or making decisions?: Yes Patient able to express need for assistance with ADLs?: Yes Does the patient have difficulty dressing or bathing?: No Independently performs ADLs?: Yes (appropriate for developmental age) Does the patient have difficulty walking or climbing stairs?: No Weakness of Legs: None Weakness of Arms/Hands: None  Home Assistive Devices/Equipment Home Assistive Devices/Equipment: None  Therapy Consults (therapy consults require a physician order) PT Evaluation Needed: No OT Evalulation Needed: No SLP Evaluation Needed: No Abuse/Neglect Assessment (Assessment to be complete while patient is alone) Physical Abuse: Denies Verbal Abuse: Denies Sexual Abuse: Denies Exploitation of patient/patient's resources: Denies Self-Neglect: Denies Values / Beliefs Cultural Requests During Hospitalization: None Spiritual Requests During Hospitalization: None Consults Spiritual Care Consult Needed: No Social Work Consult Needed: No Merchant navy officerAdvance Directives (For Healthcare) Does Patient Have a Medical Advance Directive?: No Would patient like information on creating a medical advance directive?: No - Patient declined          Disposition: This Clinical research associatewriter also staffed case with Brittany PollackLord Velez who recommended patient be observed and monitored for safety as noted above.      Disposition Initial Assessment Completed for this Encounter: Yes Disposition of Patient: (Observe and monitor) Patient refused recommended treatment: No Mode of transportation if patient is discharged/movement?: (Unk)  On Site Evaluation by:   Reviewed with Physician:    Alfredia Fergusonavid L Aquila Menzie 06/16/2018 5:19 PM

## 2018-06-16 NOTE — ED Notes (Signed)
Group home owner wants the provider to look into placing patient at [facility] to get re-evaluated?

## 2018-06-16 NOTE — ED Notes (Signed)
Pt laying in hallway in triage. Another group worker from lobby brought back to help with pt getting aggressive.

## 2018-06-16 NOTE — ED Notes (Signed)
ED Provider at bedside. Dr. Pickering °

## 2018-06-16 NOTE — ED Triage Notes (Signed)
Pt brought in by group home staff for agitation and trying to jump out of moving cars today after getting picked up from day program.

## 2018-06-16 NOTE — ED Notes (Signed)
Psych Provider at bedside.  Dr. Shaune PollackLord & Group Home Owner at bedside

## 2018-06-17 DIAGNOSIS — F4325 Adjustment disorder with mixed disturbance of emotions and conduct: Secondary | ICD-10-CM

## 2018-06-17 NOTE — BH Assessment (Signed)
[facility] Assessment Progress Note  Per Juanetta Beets, DO, this pt does not require psychiatric hospitalization at this time.  Pt is to be discharged from [facility] to return to her residential facility.  Archie Balboa, CSW agrees to facilitate this, and to contact pt's legal guardian.  No further behavioral health referrals are required for this pt.  Pt's nurse, Diane, has been notified.  Doylene Canning, MA Triage Specialist [phone]

## 2018-06-17 NOTE — ED Notes (Signed)
CBC with diff/platelets was obtained by phlebotomy this morning around 8 am.

## 2018-06-17 NOTE — ED Notes (Signed)
Pt discharged and handed over by this writer to "[place]" from the group home. Group home updated on discharge instructions. Pt was calm and cooperative.

## 2018-06-17 NOTE — ED Notes (Signed)
Pt has been alert this morning and cooperative. Paces the unit.

## 2018-06-17 NOTE — Progress Notes (Signed)
CSW informed patient has been psychiatrically and medically cleared for discharge. CSW aware patient has a legal guardian, [name] with [facility] [employer] [phone]. CSW spoke to [place] and updated her of patient's disposition. Per Alcario Drought, patient can return to her group home, [facility]. CSW spoke to [place] who states they will have someone come pick up patient by 1pm. CSW to update patient's RN. No further CSW needs at this time, please reconsult if needs arise.  Archie Balboa, LCSWA  Clinical Social Work Department  [employer]  [phone]

## 2018-06-17 NOTE — Consult Note (Signed)
[facility] Psych ED Discharge  06/17/2018 11:50 AM Brittany Velez  MRN:  [id number] Principal Problem: Adjustment disorder with mixed disturbance of emotions and conduct Discharge Diagnoses: Principal Problem:   Adjustment disorder with mixed disturbance of emotions and conduct   Subjective:  Brittany Velez reports that she does not want to return to her group home. She admits to acting out to reportedly come to the hospital because she does not want to be there. She was informed of the possible consequences of negative behaviors. She reports that she is able to keep herself safe at the group home. She agrees to speak to her case manager about finding a new place to live. She denies SI, HI or AVH.   Total Time spent with patient: 30 minutes  Past Psychiatric History: Schizoaffective disorder and IDD.   Past Medical History:  Past Medical History:  Diagnosis Date  . Diabetes mellitus without complication (HCC)   . Retardation   . Schizo-affective schizophrenia (HCC)    History reviewed. No pertinent surgical history. Family History: No family history on file. Family Psychiatric  History: None per chart review.  Social History:  Social History   Substance and Sexual Activity  Alcohol Use No     Social History   Substance and Sexual Activity  Drug Use No    Social History   Socioeconomic History  . Marital status: Single    Spouse name: Not on file  . Number of children: Not on file  . Years of education: Not on file  . Highest education level: Not on file  Occupational History  . Not on file  Social Needs  . Financial resource strain: Not on file  . Food insecurity:    Worry: Not on file    Inability: Not on file  . Transportation needs:    Medical: Not on file    Non-medical: Not on file  Tobacco Use  . Smoking status: Former Smoker    Types: Cigarettes  . Smokeless tobacco: Never Used  Substance and Sexual Activity  . Alcohol use: No  . Drug use: No  . Sexual  activity: Yes  Lifestyle  . Physical activity:    Days per week: Not on file    Minutes per session: Not on file  . Stress: Not on file  Relationships  . Social connections:    Talks on phone: Not on file    Gets together: Not on file    Attends religious service: Not on file    Active member of club or organization: Not on file    Attends meetings of clubs or organizations: Not on file    Relationship status: Not on file  Other Topics Concern  . Not on file  Social History Narrative   Pt stated that she lives with family in [place], but also that she has family in [place], which is why she came to [facility]  Per history, Pt lives at [facility].    Has this patient used any form of tobacco in the last 30 days? (Cigarettes, Smokeless Tobacco, Cigars, and/or Pipes) Prescription not provided because: Patient does not use tobacco products.   Current Medications: Current Facility-Administered Medications  Medication Dose Route Frequency Provider Last Rate Last Dose  . benztropine (COGENTIN) tablet 1 mg  1 mg Oral BID Benjiman Core, MD   1 mg at 06/17/18 0959  . cloNIDine (CATAPRES) tablet 0.1 mg  0.1 mg Oral QHS Benjiman Core, MD   0.1 mg  at 06/16/18 2128  . cloZAPine (CLOZARIL) tablet 400 mg  400 mg Oral BID Benjiman CorePickering, Nathan, MD   400 mg at 06/17/18 0957  . divalproex (DEPAKOTE ER) 24 hr tablet 500 mg  500 mg Oral BID Benjiman CorePickering, Nathan, MD   500 mg at 06/17/18 0958  . folic acid (FOLVITE) tablet 2 mg  2 mg Oral Daily Benjiman CorePickering, Nathan, MD   2 mg at 06/17/18 16100958  . haloperidol (HALDOL) tablet 10-20 mg  10-20 mg Oral See admin instructions Benjiman CorePickering, Nathan, MD      . lisinopril (PRINIVIL,ZESTRIL) tablet 10 mg  10 mg Oral Daily Otho BellowsGreen, Terri L, RPH   10 mg at 06/17/18 0957   And  . hydrochlorothiazide (MICROZIDE) capsule 12.5 mg  12.5 mg Oral Daily Otho BellowsGreen, Terri L, RPH   12.5 mg at 06/17/18 0957  . hydrOXYzine (ATARAX/VISTARIL) tablet 25 mg  25 mg Oral Renee HarderQHS  Pickering, Nathan, MD   25 mg at 06/16/18 2128  . LORazepam (ATIVAN) tablet 1 mg  1 mg Oral BID Benjiman CorePickering, Nathan, MD   1 mg at 06/17/18 0958  . metFORMIN (GLUCOPHAGE-XR) 24 hr tablet 750 mg  750 mg Oral QPC supper Benjiman CorePickering, Nathan, MD   750 mg at 06/16/18 1810  . traZODone (DESYREL) tablet 100 mg  100 mg Oral Renee HarderQHS Pickering, Nathan, MD   100 mg at 06/16/18 2127  . Valbenazine Tosylate CAPS 80 mg  80 mg Oral QHS Benjiman CorePickering, Nathan, MD      . zolpidem (AMBIEN) tablet 10 mg  10 mg Oral QHS PRN Benjiman CorePickering, Nathan, MD       Current Outpatient Medications  Medication Sig Dispense Refill  . benztropine (COGENTIN) 1 MG tablet Take 1 mg by mouth 2 (two) times daily.    . cloNIDine (CATAPRES) 0.1 MG tablet Take 0.1 mg by mouth at bedtime.    . cloZAPine (CLOZARIL) 100 MG tablet Take 400 mg by mouth 2 (two) times daily.   2  . divalproex (DEPAKOTE ER) 500 MG 24 hr tablet Take 1 tablet (500 mg total) by mouth 2 (two) times daily. (Patient taking differently: Take 250 mg by mouth 2 (two) times daily. ) 60 tablet 0  . folic acid (FOLVITE) 1 MG tablet Take 2 mg by mouth daily.     . haloperidol (HALDOL) 10 MG tablet Take 10-20 mg by mouth See admin instructions. Take 10 mg by mouth in the morning and 20 mg by mouth at bedtime    . hydrOXYzine (ATARAX/VISTARIL) 25 MG tablet Take 25 mg by mouth at bedtime.  2  . lisinopril-hydrochlorothiazide (PRINZIDE,ZESTORETIC) 10-12.5 MG tablet Take 1 tablet by mouth daily.    Marland Kitchen. LORazepam (ATIVAN) 1 MG tablet Take 1 mg by mouth 2 (two) times daily.    . metFORMIN (GLUCOPHAGE-XR) 500 MG 24 hr tablet Take 500 mg by mouth daily after supper.     . traZODone (DESYREL) 100 MG tablet Take 100 mg by mouth at bedtime.     Marcie Bal. Valbenazine Tosylate (INGREZZA) 80 MG CAPS Take 80 mg by mouth at bedtime.    Marland Kitchen. zolpidem (AMBIEN) 10 MG tablet Take 10 mg by mouth at bedtime as needed for sleep.      PTA Medications: (Not in a hospital admission)   Musculoskeletal: Strength & Muscle Tone:  within normal limits Gait & Station: normal Patient leans: N/A  Psychiatric Specialty Exam: Physical Exam  Nursing note and vitals reviewed. Constitutional: She is oriented to person, place, and time. She appears well-developed and  well-nourished.  HENT:  Head: Normocephalic and atraumatic.  Neck: Normal range of motion.  Respiratory: Effort normal.  Musculoskeletal: Normal range of motion.  Neurological: She is alert and oriented to person, place, and time.  Psychiatric: Her speech is normal and behavior is normal. Judgment and thought content normal. Her mood appears anxious. Cognition and memory are normal.    Review of Systems  Psychiatric/Behavioral: Positive for depression. Negative for hallucinations and suicidal ideas.  All other systems reviewed and are negative.   Blood pressure 124/75, pulse (!) 104, temperature 97.8 F (36.6 C), temperature source Oral, resp. rate 18, SpO2 97 %.There is no height or weight on file to calculate BMI.  General Appearance: Fairly Groomed, young, African American female, wearing paper hospital scrubs and short curly hair who is lying in bed. NAD.   Eye Contact:  Good  Speech:  Clear and Coherent and Normal Rate  Volume:  Normal  Mood:  Dysphoric  Affect:  Constricted  Thought Process:  Goal Directed, Linear and Descriptions of Associations: Intact  Orientation:  Full (Time, Place, and Person)  Thought Content:  Logical  Suicidal Thoughts:  No  Homicidal Thoughts:  No  Memory:  Immediate;   Good Recent;   Fair Remote;   Fair  Judgement:  Poor  Insight:  Shallow  Psychomotor Activity:  Normal  Concentration:  Concentration: Fair and Attention Span: Fair  Recall:  [employer] of Knowledge:  Fair  Language:  Fair  Akathisia:  No  Handed:  Right  AIMS (if indicated):   N/A  Assets:  Communication Skills Desire for Improvement Housing Resilience Social Support  ADL's:  Intact  Cognition:  Impaired with difficulty comprehending  information.   Sleep:   N/A     Demographic Factors:  NA  Loss Factors: NA  Historical Factors: Impulsivity  Risk Reduction Factors:   Living with another person, especially a relative, Positive social support and Positive therapeutic relationship  Continued Clinical Symptoms:  Previous Psychiatric Diagnoses and Treatments Medical Diagnoses and Treatments/Surgeries  Cognitive Features That Contribute To Risk:  Thought constriction (tunnel vision)    Suicide Risk:  Minimal: No identifiable suicidal ideation.  Patients presenting with no risk factors but with morbid ruminations; may be classified as minimal risk based on the severity of the depressive symptoms  Assessment:  Brittany Velez is a 33 y.o. female who presented to the hospital after jumping out of her group home van. She is well known to the psychiatry service. She frequently acts out if she is unhappy with a situation. She was reminded about the potential consequences that could result from her negative behavior. She demonstrates understanding. She denies SI, HI or AVH at this time. She does not warrant inpatient psychiatric hospitalization at this time.   Plan Of Care/Follow-up recommendations:  -Continue psychotropic medications as prescribed.  -Continue follow up with outpatient mental health provider.   Disposition: Discharge home.  Cherly Beach, DO 06/17/2018, 11:50 AM

## 2018-11-06 ENCOUNTER — Emergency Department (HOSPITAL_COMMUNITY)
Admission: EM | Admit: 2018-11-06 | Discharge: 2018-11-06 | Disposition: A | Discharge status: Home / Self Care | Payer: Medicaid Other | Attending: Emergency Medicine | Admitting: Emergency Medicine

## 2018-11-06 ENCOUNTER — Other Ambulatory Visit: Payer: Self-pay

## 2018-11-06 DIAGNOSIS — F258 Other schizoaffective disorders: Secondary | ICD-10-CM | POA: Insufficient documentation

## 2018-11-06 DIAGNOSIS — E119 Type 2 diabetes mellitus without complications: Secondary | ICD-10-CM | POA: Insufficient documentation

## 2018-11-06 DIAGNOSIS — R451 Restlessness and agitation: Secondary | ICD-10-CM | POA: Insufficient documentation

## 2018-11-06 DIAGNOSIS — Z7984 Long term (current) use of oral hypoglycemic drugs: Secondary | ICD-10-CM | POA: Diagnosis not present

## 2018-11-06 DIAGNOSIS — F79 Unspecified intellectual disabilities: Secondary | ICD-10-CM | POA: Insufficient documentation

## 2018-11-06 DIAGNOSIS — Z87891 Personal history of nicotine dependence: Secondary | ICD-10-CM | POA: Diagnosis not present

## 2018-11-06 DIAGNOSIS — Z79899 Other long term (current) drug therapy: Secondary | ICD-10-CM | POA: Diagnosis not present

## 2018-11-06 NOTE — Progress Notes (Addendum)
Consult request has been received. CSW attempting to follow up at present time.  CSW reviewed chart and sess the ED CSW's note from 06/17/2018:  "CSW informed patient has been psychiatrically and medically cleared for discharge. CSW aware patient has a legal guardian, Danae Chen Fears with [facility] [phone]. CSW spoke to [place] and updated her of patient's disposition. Per Danae Chen, patient can return to her group home, [facility]. CSW spoke to [place] who states they will have someone come pick up patient by 1pm."  From 12/26:" CSW spoke with Cassandra again as CSW has made multiple attempts to reach [place] through the number in the chart. Cassandra provided CSW a number for [place]. CSW spoke to [place]".  6:31 PM CSW called EDP at ph: [phone], but no answer.  CSW will continue to follow for D/C needs.  Alphonse Guild. Prinston Kynard, LCSW, LCAS, CSI Transitions of Care Clinical Social Worker Care Coordination Department Ph: [phone]

## 2018-11-06 NOTE — Discharge Instructions (Addendum)
Follow-up with your psychiatric provider team, social worker and dentist, as usual.  Take all of your medicines as prescribed.

## 2018-11-06 NOTE — ED Notes (Signed)
Pt in room resting. Calm.
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
## 2018-11-06 NOTE — ED Notes (Signed)
Coming out of room. Upset. Had to be redirected to room.

## 2018-11-06 NOTE — ED Notes (Signed)
Walked off unit while writemaking up empty beds. She is discharged and her ride is up front waiting on her but Probation officer hadnt discharged her whenr  she walked off. Charge nurse came to Probation officer to clarify if she was to be off the unit, which she was not. Security brought her back to the unit. Group home called prior to her walking off the unit and asked writer to give her HS and pm meds prior to her being discharged. About the same time as she called reception informed writer patients ride was here to pick her up. Had called Dr Earnest Conroy. and he came back on unit when patient was returned to unit to say Dr Eulis Foster was her Dr and notify him rather re her meds. Dr Eulis Foster called and declined to give her meds prior to her being discharged and referred it to her group home. Writer called group home and spoke with Dorothea Ogle with this update and that patient would be leaving now for her home. All property returned to her. Escorted out by security. Not in any distress, asked for and was given a diet sprite before leaving.
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
## 2018-11-06 NOTE — ED Notes (Signed)
Bed: WA29 Expected date:  Expected time:  Means of arrival:  Comments: EMS-behavioral issue

## 2019-02-02 ENCOUNTER — Encounter (HOSPITAL_COMMUNITY): Payer: Self-pay

## 2019-02-02 ENCOUNTER — Emergency Department (HOSPITAL_COMMUNITY)
Admission: EM | Admit: 2019-02-02 | Discharge: 2019-02-02 | Disposition: A | Discharge status: Home / Self Care | Payer: Medicaid Other | Attending: Emergency Medicine | Admitting: Emergency Medicine

## 2019-02-02 ENCOUNTER — Other Ambulatory Visit: Payer: Self-pay

## 2019-02-02 DIAGNOSIS — R45851 Suicidal ideations: Secondary | ICD-10-CM

## 2019-02-02 DIAGNOSIS — R440 Auditory hallucinations: Secondary | ICD-10-CM | POA: Diagnosis present

## 2019-02-02 DIAGNOSIS — F259 Schizoaffective disorder, unspecified: Secondary | ICD-10-CM | POA: Insufficient documentation

## 2019-02-02 DIAGNOSIS — Z87891 Personal history of nicotine dependence: Secondary | ICD-10-CM | POA: Insufficient documentation

## 2019-02-02 DIAGNOSIS — Z79899 Other long term (current) drug therapy: Secondary | ICD-10-CM | POA: Diagnosis not present

## 2019-02-02 DIAGNOSIS — R443 Hallucinations, unspecified: Secondary | ICD-10-CM

## 2019-02-02 DIAGNOSIS — F4325 Adjustment disorder with mixed disturbance of emotions and conduct: Secondary | ICD-10-CM | POA: Diagnosis present

## 2019-02-02 DIAGNOSIS — E119 Type 2 diabetes mellitus without complications: Secondary | ICD-10-CM | POA: Diagnosis not present

## 2019-02-02 LAB — DIFFERENTIAL
Basophils Absolute: 0 10*3/uL (ref 0.0–0.1)
Basophils Relative: 0 %
Eosinophils Absolute: 0.2 10*3/uL (ref 0.0–0.5)
Eosinophils Relative: 3 %
Lymphocytes Relative: 32 %
Lymphs Abs: 1.8 10*3/uL (ref 0.7–4.0)
Monocytes Absolute: 0.3 10*3/uL (ref 0.1–1.0)
Monocytes Relative: 6 %
Neutro Abs: 3.2 10*3/uL (ref 1.7–7.7)
Neutrophils Relative %: 58 %

## 2019-02-02 LAB — CBC
HCT: 32.5 % — ABNORMAL LOW (ref 36.0–46.0)
Hemoglobin: 9.8 g/dL — ABNORMAL LOW (ref 12.0–15.0)
MCH: 28.9 pg (ref 26.0–34.0)
MCHC: 30.2 g/dL (ref 30.0–36.0)
MCV: 95.9 fL (ref 80.0–100.0)
Platelets: 266 10*3/uL (ref 150–400)
RBC: 3.39 MIL/uL — ABNORMAL LOW (ref 3.87–5.11)
RDW: 13.2 % (ref 11.5–15.5)
WBC: 5.5 10*3/uL (ref 4.0–10.5)
nRBC: 0 % (ref 0.0–0.2)

## 2019-02-02 LAB — I-STAT BETA HCG BLOOD, ED (MC, WL, AP ONLY): I-stat hCG, quantitative: <5 m[IU]/mL (ref <5)

## 2019-02-02 LAB — RAPID URINE DRUG SCREEN, HOSP PERFORMED
Amphetamines: NOT DETECTED
Barbiturates: NOT DETECTED
Benzodiazepines: POSITIVE — AB
Cocaine: NOT DETECTED
Opiates: NOT DETECTED
Tetrahydrocannabinol: NOT DETECTED

## 2019-02-02 LAB — ACETAMINOPHEN LEVEL: Acetaminophen (Tylenol), Serum: <10 ug/mL — ABNORMAL LOW (ref 10–30)

## 2019-02-02 LAB — COMPREHENSIVE METABOLIC PANEL
ALT: 16 U/L (ref 0–44)
AST: 15 U/L (ref 15–41)
Albumin: 3.7 g/dL (ref 3.5–5.0)
Alkaline Phosphatase: 70 U/L (ref 38–126)
Anion gap: 7 (ref 5–15)
BUN: 14 mg/dL (ref 6–20)
CO2: 26 mmol/L (ref 22–32)
Calcium: 9 mg/dL (ref 8.9–10.3)
Chloride: 110 mmol/L (ref 98–111)
Creatinine, Ser: 0.81 mg/dL (ref 0.44–1.00)
GFR calc Af Amer: >60 mL/min (ref >60)
GFR calc non Af Amer: >60 mL/min (ref >60)
Glucose, Bld: 110 mg/dL — ABNORMAL HIGH (ref 70–99)
Potassium: 3.8 mmol/L (ref 3.5–5.1)
Sodium: 143 mmol/L (ref 135–145)
Total Bilirubin: 0.2 mg/dL — ABNORMAL LOW (ref 0.3–1.2)
Total Protein: 7 g/dL (ref 6.5–8.1)

## 2019-02-02 LAB — SALICYLATE LEVEL: Salicylate Lvl: <7 mg/dL (ref 2.8–30.0)

## 2019-02-02 LAB — ETHANOL: Alcohol, Ethyl (B): <10 mg/dL (ref <10)

## 2019-02-02 MED ORDER — LORAZEPAM 1 MG PO TABS
1.0000 mg | ORAL_TABLET | Freq: Two times a day (BID) | ORAL | Status: DC
  Administered 2019-02-02: 1 mg via ORAL
  Filled 2019-02-02: qty 1

## 2019-02-02 MED ORDER — HYDROXYZINE HCL 25 MG PO TABS: 25.0000 mg | ORAL_TABLET | Freq: Three times a day (TID) | ORAL | Status: DC | PRN

## 2019-02-02 MED ORDER — HYDROCHLOROTHIAZIDE 12.5 MG PO CAPS
12.5000 mg | ORAL_CAPSULE | Freq: Every day | ORAL | Status: DC
  Administered 2019-02-02: 11:00:00 12.5 mg via ORAL
  Filled 2019-02-02: qty 1

## 2019-02-02 MED ORDER — CLOZAPINE 100 MG PO TABS
400.0000 mg | ORAL_TABLET | Freq: Two times a day (BID) | ORAL | Status: DC
  Administered 2019-02-02: 400 mg via ORAL
  Filled 2019-02-02 (×3): qty 4

## 2019-02-02 MED ORDER — TRAZODONE HCL 100 MG PO TABS: 100.0000 mg | ORAL_TABLET | Freq: Every evening | ORAL | Status: DC | PRN

## 2019-02-02 MED ORDER — AMANTADINE HCL 100 MG PO CAPS
100.0000 mg | ORAL_CAPSULE | Freq: Two times a day (BID) | ORAL | Status: DC
  Administered 2019-02-02: 100 mg via ORAL
  Filled 2019-02-02: qty 1

## 2019-02-02 MED ORDER — CARBAMAZEPINE ER 100 MG PO CP12
300.0000 mg | ORAL_CAPSULE | Freq: Two times a day (BID) | ORAL | Status: DC
  Filled 2019-02-02: qty 3

## 2019-02-02 MED ORDER — CARBAMAZEPINE ER 200 MG PO CP12
200.0000 mg | ORAL_CAPSULE | Freq: Three times a day (TID) | ORAL | Status: DC
  Filled 2019-02-02 (×2): qty 1

## 2019-02-02 MED ORDER — FOLIC ACID 1 MG PO TABS
2.0000 mg | ORAL_TABLET | Freq: Every day | ORAL | Status: DC
  Administered 2019-02-02: 2 mg via ORAL
  Filled 2019-02-02: qty 2

## 2019-02-02 MED ORDER — CLONIDINE HCL 0.1 MG PO TABS
0.2000 mg | ORAL_TABLET | Freq: Two times a day (BID) | ORAL | Status: DC
  Administered 2019-02-02: 0.2 mg via ORAL
  Filled 2019-02-02: qty 2

## 2019-02-02 MED ORDER — LISINOPRIL 10 MG PO TABS
10.0000 mg | ORAL_TABLET | Freq: Every day | ORAL | Status: DC
  Administered 2019-02-02: 10 mg via ORAL
  Filled 2019-02-02: qty 1

## 2019-02-02 MED ORDER — LISINOPRIL-HYDROCHLOROTHIAZIDE 10-12.5 MG PO TABS: 1.0000 | ORAL_TABLET | Freq: Every day | ORAL | Status: DC

## 2019-02-02 NOTE — [facility] Suicide Risk Assessment (Cosign Needed)
Suicide Risk Assessment  Discharge Assessment   [facility] Discharge Suicide Risk Assessment   Principal Problem: Adjustment disorder with mixed disturbance of emotions and conduct Discharge Diagnoses: Principal Problem:   Adjustment disorder with mixed disturbance of emotions and conduct   Total Time spent with patient: 20 minutes  Patient verbalizes "I came to the emergency room because of my behavior at the group home." Patient denies SI, HI and AVH. Patient endorses passive SI, no plan. Patient denies HI, denies AVH. Patient states she takes home medications as prescribed. Patient denies history of self-harm. Patient endorses history of one previous psychiatric inpatient hospital stay, "about ten years ago." Patient states "All I want to do is go to a different group home."   Musculoskeletal: Strength & Muscle Tone: within normal limits Gait & Station: normal Patient leans: N/A  Psychiatric Specialty Exam:   Blood pressure (!) 137/95, pulse 89, temperature 98.2 F (36.8 C), temperature source Oral, resp. rate 18, SpO2 100 %.There is no height or weight on file to calculate BMI.  General Appearance: Casual  Eye Contact::  Good  Speech:  Clear and Coherent409  Volume:  Normal  Mood:  Euthymic  Affect:  Appropriate  Thought Process:  Coherent and Descriptions of Associations: Intact  Orientation:  Full (Time, Place, and Person)  Thought Content:  Logical  Suicidal Thoughts:  No  Homicidal Thoughts:  No  Memory:  Immediate;   Good Recent;   Good Remote;   Good  Judgement:  Fair  Insight:  Fair  Psychomotor Activity:  Normal  Concentration:  Fair  Recall:  Good  Fund of Knowledge:Fair  Language: Good  Akathisia:  No  Handed:  Right  AIMS (if indicated):     Assets:  Agricultural consultant Housing Social Support  Sleep:     Cognition: WNL  ADL's:  Intact   Mental Status Per Nursing Assessment::   On Admission:     Demographic Factors:   NA  Loss Factors: NA  Historical Factors: Impulsivity  Risk Reduction Factors:   Living with another person, especially a relative, Positive social support and Positive therapeutic relationship  Continued Clinical Symptoms:  Depression  Cognitive Features That Contribute To Risk:  None    Suicide Risk:  Minimal: No identifiable suicidal ideation.  Patients presenting with no risk factors but with morbid ruminations; may be classified as minimal risk based on the severity of the depressive symptoms    Plan Of Care/Follow-up recommendations:  Other:  return to current group home, discuss desire for new group home with case work  Emmaline Kluver, [place] 02/02/2019, 12:07 PM

## 2019-02-02 NOTE — ED Provider Notes (Signed)
TIME SEEN: 2:34 AM  CHIEF COMPLAINT: Suicidal thoughts  HPI: Patient is a 33 year old with history of schizoaffective disorder, retardation, diabetes who presents to the emergency department with her family for concerns for suicidal thoughts.  States this has been going on "a long time".  Has had previous suicide attempt.  No current plan.  She states she is hearing voices telling her to hurt herself.  She also has visual hallucinations.  No HI.  No drug or alcohol use.  No fevers, cough, vomiting, diarrhea.  ROS: See HPI Constitutional: no fever  Eyes: no drainage  ENT: no runny nose   Cardiovascular:  no chest pain  Resp: no SOB  GI: no vomiting GU: no dysuria Integumentary: no rash  Allergy: no hives  Musculoskeletal: no leg swelling  Neurological: no slurred speech ROS otherwise negative  PAST MEDICAL HISTORY/PAST SURGICAL HISTORY:  Past Medical History:  Diagnosis Date  . Diabetes mellitus without complication ([place])   . Retardation   . Schizo-affective schizophrenia ([place])     MEDICATIONS:  Prior to Admission medications   Medication Sig Start Date End Date Taking? Authorizing Provider  benztropine (COGENTIN) 1 MG tablet Take 1 mg by mouth 2 (two) times daily.    [provider]  cloNIDine (CATAPRES) 0.1 MG tablet Take 0.1 mg by mouth at bedtime.    [provider]  cloZAPine (CLOZARIL) 100 MG tablet Take 400 mg by mouth 2 (two) times daily.  02/05/18   [provider]  divalproex (DEPAKOTE ER) 500 MG 24 hr tablet Take 1 tablet (500 mg total) by mouth 2 (two) times daily. Patient taking differently: Take 250 mg by mouth 2 (two) times daily.  05/08/18   Patrecia Pour, NP  folic acid (FOLVITE) 1 MG tablet Take 2 mg by mouth daily.     [provider]  haloperidol (HALDOL) 10 MG tablet Take 10-20 mg by mouth See admin instructions. Take 10 mg by mouth in the morning and 20 mg by mouth at bedtime    [provider]  hydrOXYzine  (ATARAX/VISTARIL) 25 MG tablet Take 25 mg by mouth at bedtime. 02/05/18   [provider]  lisinopril-hydrochlorothiazide (PRINZIDE,ZESTORETIC) 10-12.5 MG tablet Take 1 tablet by mouth daily.    [provider]  LORazepam (ATIVAN) 1 MG tablet Take 1 mg by mouth 2 (two) times daily.    [provider]  metFORMIN (GLUCOPHAGE-XR) 500 MG 24 hr tablet Take 500 mg by mouth daily after supper.     [provider]  traZODone (DESYREL) 100 MG tablet Take 100 mg by mouth at bedtime.     [provider]  Valbenazine Tosylate (INGREZZA) 80 MG CAPS Take 80 mg by mouth at bedtime.    [provider]  zolpidem (AMBIEN) 10 MG tablet Take 10 mg by mouth at bedtime as needed for sleep.     [provider]    ALLERGIES:  No Known Allergies  SOCIAL HISTORY:  Social History   Tobacco Use  . Smoking status: Former Smoker    Types: Cigarettes  . Smokeless tobacco: Never Used  Substance Use Topics  . Alcohol use: No    FAMILY HISTORY: No family history on file.  EXAM: BP (!) 137/95 (BP Location: Right Arm)   Pulse 89   Temp 98.2 F (36.8 C) (Oral)   Resp 18   SpO2 100%  CONSTITUTIONAL: Alert and oriented and responds appropriately to questions. Well-appearing; well-nourished HEAD: Normocephalic EYES: Conjunctivae clear, pupils appear  equal, EOMI ENT: normal nose; moist mucous membranes NECK: Supple, no meningismus, no nuchal rigidity, no LAD  CARD: RRR; S1 and S2 appreciated; no murmurs, no clicks, no rubs, no gallops RESP: Normal chest excursion without splinting or tachypnea; breath sounds clear and equal bilaterally; no wheezes, no rhonchi, no rales, no hypoxia or respiratory distress, speaking full sentences ABD/GI: Normal bowel sounds; non-distended; soft, non-tender, no rebound, no guarding, no peritoneal signs, no hepatosplenomegaly BACK:  The back appears normal and is non-tender to palpation, there is no CVA tenderness EXT:  Normal ROM in all joints; non-tender to palpation; no edema; normal capillary refill; no cyanosis, no calf tenderness or swelling    SKIN: Normal color for age and race; warm; no rash NEURO: Moves all extremities equally PSYCH: Flat affect.  Endorses SI, command hallucinations.  No HI.  MEDICAL DECISION MAKING: Patient here with suicidal thoughts.  Here voluntarily with family.  Has had previous suicide attempt.  Will obtain screening labs, urine.  Will consult TTS.  ED PROGRESS: Patient screening labs unremarkable other than hemoglobin of 9.8.  This appears stable for patient.  UDS positive for benzodiazepines which she is prescribed.  Medically cleared.   4:00 AM  Spoke with Thurston Pounds with TTS.  Patient meets inpatient psychiatric criteria.  They have no beds available at behavioral health hospital.  Will seek placement.  She is here voluntarily.   I reviewed all nursing notes, vitals, pertinent previous records, EKGs, lab and urine results, imaging (as available).    Sueanne Maniaci, Layla Maw, DO 02/02/19 [phone]

## 2019-02-02 NOTE — BHH Counselor (Signed)
Clinician called TTS cart however no answer.    Vertell Novak, [place], [facility], [facility] Triage Specialist [phone]

## 2019-02-02 NOTE — BH Assessment (Addendum)
[facility] Assessment Progress Note  Per Buford Dresser, DO, this pt does not require psychiatric hospitalization at this time.  Pt is to be discharged from [facility] with recommendation to continue treatment with [facility]gr[facility].  This has been included in pt's discharge instructions.  Pt's nurse, Nena Jordan, has been notified.  Jalene Mullet, MA Triage Specialist [phone]   Addendum:  Nena Jordan points out that pt lives in a group home and has a legal guardian.  Letitia Libra, FNP has placed a social work consult to facilitate notification and pt's return to to the group home.  At 13:54 I spoke to Golden Circle, CSW to notify her of this.  Nena Jordan has also been updated.  Jalene Mullet, [place] Coordinator [phone]
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
## 2019-02-02 NOTE — Progress Notes (Signed)
CSW received consult and phone call from Tonette Bihari stating patient has been psych cleared. CSW was informed patient has a legal guardian and lives in a group home. Patient to return to her group home.  CSW contacted Danae Chen Fears [phone]) and informed her patient was psych cleared and that we are working to get patient back to the group home. Ms. Margarita Grizzle was agreeable and provided CSW with number to the group home [phone]). CSW called the number provided and spoke with Jesse Fall. Dorothea Ogle reports that a group home worker named Mateo Flow will be coming to pick patient up around 3p. CSW notified RN Nena Jordan about this. CSW signing off.   Golden Circle, LCSW Transitions of Care Department [facility] ED [phone]

## 2019-02-02 NOTE — Discharge Instructions (Signed)
For your behavioral health needs, you are advised to continue treatment with Mojeed Akintayo, MD: ° °     Mojeed Akintayo, MD °     [facility] °     [address] °     [place], [place] [address] °     [phone] °

## 2019-02-02 NOTE — BHH Counselor (Signed)
Clinician attempted to call triage to set up TTS cart however no answer. Clinician to check back.    Vertell Novak, MS, [facility], [facility] Triage Specialist [phone].

## 2019-02-02 NOTE — ED Triage Notes (Signed)
Pt states that she is hearing voices starting tonight. States that they are telling her to hurt herself. Denies a plan and states that she only wants to hurt herself a little bit. Reports medication compliance.

## 2019-02-02 NOTE — ED Notes (Signed)
Pt arrived to unit calm and cooperative.  She appears to be responding to internal stimuli.  She has to be continuously redirected  to shut the bathroom door when she is urinating.  15 minute checks and video monitoring in place.

## 2019-02-08 ENCOUNTER — Emergency Department (HOSPITAL_COMMUNITY)
Admission: EM | Admit: 2019-02-08 | Discharge: 2019-02-09 | Disposition: A | Discharge status: Home / Self Care | Payer: Medicaid Other | Attending: Emergency Medicine | Admitting: Emergency Medicine

## 2019-02-08 ENCOUNTER — Encounter (HOSPITAL_COMMUNITY): Payer: Self-pay | Admitting: Emergency Medicine

## 2019-02-08 ENCOUNTER — Other Ambulatory Visit: Payer: Self-pay

## 2019-02-08 DIAGNOSIS — F79 Unspecified intellectual disabilities: Secondary | ICD-10-CM | POA: Insufficient documentation

## 2019-02-08 DIAGNOSIS — F4325 Adjustment disorder with mixed disturbance of emotions and conduct: Secondary | ICD-10-CM | POA: Diagnosis present

## 2019-02-08 DIAGNOSIS — Z87891 Personal history of nicotine dependence: Secondary | ICD-10-CM | POA: Insufficient documentation

## 2019-02-08 DIAGNOSIS — Z046 Encounter for general psychiatric examination, requested by authority: Secondary | ICD-10-CM | POA: Diagnosis present

## 2019-02-08 DIAGNOSIS — R4689 Other symptoms and signs involving appearance and behavior: Secondary | ICD-10-CM

## 2019-02-08 DIAGNOSIS — F250 Schizoaffective disorder, bipolar type: Secondary | ICD-10-CM | POA: Diagnosis not present

## 2019-02-08 DIAGNOSIS — Z79899 Other long term (current) drug therapy: Secondary | ICD-10-CM | POA: Diagnosis not present

## 2019-02-08 DIAGNOSIS — E119 Type 2 diabetes mellitus without complications: Secondary | ICD-10-CM | POA: Insufficient documentation

## 2019-02-08 LAB — COMPREHENSIVE METABOLIC PANEL
ALT: 14 U/L (ref 0–44)
AST: 14 U/L — ABNORMAL LOW (ref 15–41)
Albumin: 3.7 g/dL (ref 3.5–5.0)
Alkaline Phosphatase: 78 U/L (ref 38–126)
Anion gap: 7 (ref 5–15)
BUN: 15 mg/dL (ref 6–20)
CO2: 26 mmol/L (ref 22–32)
Calcium: 8.7 mg/dL — ABNORMAL LOW (ref 8.9–10.3)
Chloride: 106 mmol/L (ref 98–111)
Creatinine, Ser: 0.71 mg/dL (ref 0.44–1.00)
GFR calc Af Amer: >60 mL/min (ref >60)
GFR calc non Af Amer: >60 mL/min (ref >60)
Glucose, Bld: 100 mg/dL — ABNORMAL HIGH (ref 70–99)
Potassium: 3.6 mmol/L (ref 3.5–5.1)
Sodium: 139 mmol/L (ref 135–145)
Total Bilirubin: 0.1 mg/dL — ABNORMAL LOW (ref 0.3–1.2)
Total Protein: 6.8 g/dL (ref 6.5–8.1)

## 2019-02-08 LAB — CBC
HCT: 32.6 % — ABNORMAL LOW (ref 36.0–46.0)
Hemoglobin: 9.9 g/dL — ABNORMAL LOW (ref 12.0–15.0)
MCH: 29.3 pg (ref 26.0–34.0)
MCHC: 30.4 g/dL (ref 30.0–36.0)
MCV: 96.4 fL (ref 80.0–100.0)
Platelets: 275 10*3/uL (ref 150–400)
RBC: 3.38 MIL/uL — ABNORMAL LOW (ref 3.87–5.11)
RDW: 13.6 % (ref 11.5–15.5)
WBC: 7.4 10*3/uL (ref 4.0–10.5)
nRBC: 0 % (ref 0.0–0.2)

## 2019-02-08 LAB — RAPID URINE DRUG SCREEN, HOSP PERFORMED
Amphetamines: NOT DETECTED
Barbiturates: NOT DETECTED
Benzodiazepines: NOT DETECTED
Cocaine: NOT DETECTED
Opiates: NOT DETECTED
Tetrahydrocannabinol: NOT DETECTED

## 2019-02-08 LAB — I-STAT BETA HCG BLOOD, ED (MC, WL, AP ONLY): I-stat hCG, quantitative: <5 m[IU]/mL (ref <5)

## 2019-02-08 MED ORDER — TRAZODONE HCL 100 MG PO TABS: 100.0000 mg | ORAL_TABLET | Freq: Every evening | ORAL | Status: DC | PRN

## 2019-02-08 MED ORDER — CLOZAPINE 100 MG PO TABS
400.0000 mg | ORAL_TABLET | Freq: Two times a day (BID) | ORAL | Status: DC
  Administered 2019-02-09: 11:00:00 400 mg via ORAL
  Filled 2019-02-08 (×2): qty 4

## 2019-02-08 MED ORDER — LORAZEPAM 1 MG PO TABS
1.0000 mg | ORAL_TABLET | Freq: Two times a day (BID) | ORAL | Status: DC
  Administered 2019-02-09: 11:00:00 1 mg via ORAL
  Filled 2019-02-08: qty 1

## 2019-02-08 MED ORDER — FOLIC ACID 1 MG PO TABS
2.0000 mg | ORAL_TABLET | Freq: Every day | ORAL | Status: DC
  Administered 2019-02-09: 11:00:00 2 mg via ORAL
  Filled 2019-02-08: qty 2

## 2019-02-08 MED ORDER — CARBAMAZEPINE ER 300 MG PO CP12: 300.0000 mg | ORAL_CAPSULE | Freq: Two times a day (BID) | ORAL | Status: DC

## 2019-02-08 MED ORDER — HYDROXYZINE HCL 25 MG PO TABS: 25.0000 mg | ORAL_TABLET | Freq: Three times a day (TID) | ORAL | Status: DC | PRN

## 2019-02-08 MED ORDER — LISINOPRIL-HYDROCHLOROTHIAZIDE 10-12.5 MG PO TABS: 1.0000 | ORAL_TABLET | Freq: Every day | ORAL | Status: DC

## 2019-02-08 MED ORDER — CLONIDINE HCL 0.1 MG PO TABS
0.2000 mg | ORAL_TABLET | Freq: Two times a day (BID) | ORAL | Status: DC
  Administered 2019-02-09: 0.2 mg via ORAL
  Filled 2019-02-08: qty 2

## 2019-02-08 MED ORDER — AMANTADINE HCL 100 MG PO CAPS
100.0000 mg | ORAL_CAPSULE | Freq: Two times a day (BID) | ORAL | Status: DC
  Administered 2019-02-09: 11:00:00 100 mg via ORAL
  Filled 2019-02-08: qty 1
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
## 2019-02-08 NOTE — ED Triage Notes (Signed)
Patient here from group home via [employer]. Reports that patient is violent and aggressive. IVC'd. Denies SI.

## 2019-02-09 ENCOUNTER — Encounter (HOSPITAL_COMMUNITY): Payer: Self-pay | Admitting: Registered Nurse

## 2019-02-09 LAB — DIFFERENTIAL
Abs Immature Granulocytes: 0.03 10*3/uL (ref 0.00–0.07)
Basophils Absolute: 0 10*3/uL (ref 0.0–0.1)
Basophils Relative: 1 %
Eosinophils Absolute: 0.2 10*3/uL (ref 0.0–0.5)
Eosinophils Relative: 3 %
Immature Granulocytes: 0 %
Lymphocytes Relative: 34 %
Lymphs Abs: 2.5 10*3/uL (ref 0.7–4.0)
Monocytes Absolute: 0.4 10*3/uL (ref 0.1–1.0)
Monocytes Relative: 6 %
Neutro Abs: 4.1 10*3/uL (ref 1.7–7.7)
Neutrophils Relative %: 56 %

## 2019-02-09 LAB — ETHANOL: Alcohol, Ethyl (B): <10 mg/dL (ref <10)

## 2019-02-09 LAB — SALICYLATE LEVEL: Salicylate Lvl: <7 mg/dL (ref 2.8–30.0)

## 2019-02-09 LAB — ACETAMINOPHEN LEVEL: Acetaminophen (Tylenol), Serum: <10 ug/mL — ABNORMAL LOW (ref 10–30)

## 2019-02-09 MED ORDER — CARBAMAZEPINE ER 100 MG PO TB12: 300.0000 mg | ORAL_TABLET | Freq: Two times a day (BID) | ORAL | 0 refills | Status: DC

## 2019-02-09 MED ORDER — HYDROCHLOROTHIAZIDE 12.5 MG PO CAPS
12.5000 mg | ORAL_CAPSULE | Freq: Every day | ORAL | Status: DC
  Administered 2019-02-09: 11:00:00 12.5 mg via ORAL
  Filled 2019-02-09 (×2): qty 1

## 2019-02-09 MED ORDER — LISINOPRIL 10 MG PO TABS
10.0000 mg | ORAL_TABLET | Freq: Every day | ORAL | Status: DC
  Administered 2019-02-09: 10 mg via ORAL
  Filled 2019-02-09: qty 1

## 2019-02-09 MED ORDER — CARBAMAZEPINE ER 200 MG PO TB12
300.0000 mg | ORAL_TABLET | Freq: Two times a day (BID) | ORAL | Status: DC
  Administered 2019-02-09: 300 mg via ORAL
  Filled 2019-02-09 (×2): qty 1

## 2019-02-09 NOTE — BHH Suicide Risk Assessment (Addendum)
[facility] Discharge Suicide Risk Assessment   Principal Problem: Adjustment disorder with mixed disturbance of emotions and conduct Discharge Diagnoses: Principal Problem:   Adjustment disorder with mixed disturbance of emotions and conduct   Total Time spent with patient: 30 minutes  Musculoskeletal: Strength & Muscle Tone: within normal limits Gait & Station: UTA since patient is lying in bed. Patient leans: N/A  Psychiatric Specialty Exam: Review of Systems  Psychiatric/Behavioral: Negative for hallucinations, substance abuse and suicidal ideas.  All other systems reviewed and are negative.   Blood pressure 127/77, pulse 97, temperature 98.4 F (36.9 C), temperature source Oral, resp. rate 20, SpO2 100 %.There is no height or weight on file to calculate BMI.  General Appearance: Fairly Groomed, young, African American female, wearing paper hospital scrubs who is lying in bed. NAD.   Eye Contact::  Good  Speech:  Clear and Coherent and Normal Rate  Volume:  Normal  Mood:  Euthymic  Affect:  Constricted  Thought Process:  Linear and Descriptions of Associations: Intact  Orientation:  Full (Time, Place, and Person)  Thought Content:  Logical  Suicidal Thoughts:  No  Homicidal Thoughts:  No  Memory:  Immediate;   Good Recent;   Good Remote;   Good  Judgement:  Fair  Insight:  Fair  Psychomotor Activity:  Normal  Concentration:  Good  Recall:  Good  Fund of Knowledge:Good  Language: Good  Akathisia:  No  Handed:  Right  AIMS (if indicated):   N/A  Assets:  Financial Resources/Insurance Housing Physical Health Resilience Social Support  Sleep:   N/A  Cognition: Impaired. History of IDD.   ADL's:  Intact   Mental Status Per Nursing Assessment::   On Admission:    Patient here from group home via [employer]. Reports that patient is violent and aggressive. IVC'd. Denies SI.   Demographic Factors:  NA  Loss Factors: NA  Historical Factors: Impulsivity  Risk Reduction  Factors:   Living with another person, especially a relative, Positive social support and Positive therapeutic relationship  Continued Clinical Symptoms:  More than one psychiatric diagnosis Previous Psychiatric Diagnoses and Treatments  Cognitive Features That Contribute To Risk:  Closed-mindedness    Suicide Risk:  Minimal: No identifiable suicidal ideation.  Patients presenting with no risk factors but with morbid ruminations; may be classified as minimal risk based on the severity of the depressive symptoms  Assessment:  Richardine Peppers is a 33 y.o. female who was admitted with agitation from her group home. She has been appropriate in behavior on the unit so it is suspected that her presentation is behavioral. She often acts out for secondary gain to leave her group home with the hope of obtaining inpatient psychiatric hospitalization at [facility]. She denies SI, HI or AVH today. She does not appear to be responding to internal stimuli. She reports that she is feeling better. She appears to be at her baseline. She does not warrant inpatient psychiatric hospitalization at this time.   Plan Of Care/Follow-up recommendations:  -Continue psychotropic medications as prescribed.  -Discharge home.   Faythe Dingwall, DO 02/09/2019, 11:49 AM

## 2019-02-09 NOTE — BH Assessment (Signed)
[facility] Assessment Progress Note  Per Buford Dresser, DO, this pt does not require psychiatric hospitalization at this time.  Pt presents under IVC initiated by pt's group home supervisor, which Dr Mariea Clonts has rescinded.  Pt is to be discharged from [facility] with recommendation to continue treatment with Corena Pilgrim, MD.  Golden Circle, CSW agrees to contact pt's legal guardian, as well as pt's group home to facilitate pt's return.  This has been included in pt's discharge instructions.  Pt's nurse, Caryl Pina, has been notified.  Jalene Mullet, [place] Triage Specialist [phone]

## 2019-02-09 NOTE — ED Notes (Addendum)
Pt d/c home per MD order. Group home staff here for discharge ride home. Ambulatory off unit.

## 2019-02-09 NOTE — ED Notes (Addendum)
Dr. Mariea Clonts made aware of pt current behaviors.

## 2019-02-09 NOTE — Progress Notes (Signed)
CSW received consult and phone call from Tonette Bihari stating patient has been psych cleared. CSW was informed patient has a legal guardian and lives in a group home. Patient to return to her group home.  CSW contacted Danae Chen Fears [phone]) and informed her patient was psych cleared and that we are working to get patient picked up to go back to the group home. Ms. Margarita Grizzle was agreeable. CSW calledTyler Hassell Done with patient's group home. Dorothea Ogle reports that a group home worker named Bary Leriche will be picking up patient. Dorothea Ogle ask that AVS be given to Ms. Johnson to provide to the group home. CSW notified RN Caryl Pina about this. CSW signing off.   Golden Circle, LCSW Transitions of Care Department [facility] ED [phone]

## 2019-02-09 NOTE — BH Assessment (Addendum)
Tele Assessment Note   Patient Name: Brittany Velez MRN: [id number] Referring Physician: Dr. Lorre Nick, MD Location of Patient: [facility] ED Location of Provider: Behavioral Health TTS Department  Brittany Velez is a 33 y.o. female who was brought to [facility] after the police were called to the group home she lives at twice due to her acting-out behaviors and aggression toward group staff. Pt's group home IVCed her due to her aggression and violent behaviors towards them. Pt stated she is experiencing SI and that she plans to kill herself by stabbing herself in the chest. Upon talking to pt, she inquires of clinician as to whether she will be able to come to [facility]. Clinician re-directed pt that more information needed to be gathered prior to making that decision. Pt denied that there were any fights or arguments that were contributing towards her SI. Pt states she has attempted to kill herself several times and that the last attempt was last night when she slashed her throat with a knife. Clinician inquired as to whether pt went to the doctor for slashing her throat and pt stated she did; clinician looked for marks on pt's neck but there were none. In previous assessments pt has denied attempts at killing herself, the most recent taking place on 02/02/2019. Clinician inquired as to whether pt had a plan to kill herself and she stated that she planned to hang herself with a rope; clinician inquired as to whether pt has a rope and she stated she does not.  Pt denied HI. She shares she hears voices but states they are not telling her anything. Pt reports her sleep is "ok" and her appetite is "good."  Pt's group home, [facility], was contacted for collateral information. A caretaker at the group home, Marchelle Gearing, stated pt had "a mental breakdown" tonight and began talking to people in her room who weren't there. She states she began stating that Brittany Velez was trying to give her babies and then  started telling someone to get out of her room when there was no one in her room. Pt's caretaker stated pt was talking to multiple people and answering back. She states pt's behaviors then entered out of her room and into other parts of the house.  The manager of the home, Gerome Apley, was contacted by clinician at [phone] and a message was left for pt's legal guardian, [name] of [employer], at [phone].  Pt was oriented x3--she thought the date was September 2003. Pt's recent memory is intact. Pt was overall cooperative, though she was focused on her ability to come to [facility] for several days. Pt's insight, judgement, and impulse control is poor at this time.   Diagnosis: F25.0, Schizoaffective disorder, Bipolar type   Past Medical History:  Past Medical History:  Diagnosis Date  . Diabetes mellitus without complication (HCC)   . Retardation   . Schizo-affective schizophrenia (HCC)     History reviewed. No pertinent surgical history.  Family History: History reviewed. No pertinent family history.  Social History:  reports that she has quit smoking. Her smoking use included cigarettes. She has never used smokeless tobacco. She reports that she does not drink alcohol or use drugs.  Additional Social History:  Alcohol / Drug Use Pain Medications: Please see MAR Prescriptions: Please see MAR Over the Counter: Please see MAR History of alcohol / drug use?: (UTA) Longest period of sobriety (when/how long): UTA  CIWA:   COWS:    Allergies:  No Known Allergies  Home Medications: (Not in a hospital admission)   OB/GYN Status:  No LMP recorded.  General Assessment Data Assessment unable to be completed: Yes Reason for not completing assessment: Tele-Assessment machine is not working at this time Location of Assessment: [facility] ED TTS Assessment: In system Is this a Tele or Face-to-Face Assessment?: Tele Assessment Is this an Initial Assessment or a Re-assessment for this  encounter?: Initial Assessment Patient Accompanied by:: N/A Permission Given to speak with another: Yes Language Other than English: No Living Arrangements: In Group Home: (Comment: Name of Group Home)([facility]) What gender do you identify as?: Female Marital status: Single Maiden name: Ilyas Pregnancy Status: No Living Arrangements: Group Home Can pt return to current living arrangement?: Yes Admission Status: Involuntary Petitioner: Other(Group Home) Is patient capable of signing voluntary admission?: Yes Referral Source: Self/Family/Friend Insurance type: Medicaid     Crisis Care Plan Living Arrangements: Group Home Legal Guardian: Other:([name] Fears with [employer] ) Name of Psychiatrist: Dr. Darleene Cleaver.  Name of Therapist: None  Education Status Is patient currently in school?: No Is the patient employed, unemployed or receiving disability?: Receiving disability income  Risk to self with the past 6 months Suicidal Ideation: Yes-Currently Present Has patient been a risk to self within the past 6 months prior to admission? : Yes Suicidal Intent: Yes-Currently Present Has patient had any suicidal intent within the past 6 months prior to admission? : Yes Is patient at risk for suicide?: Yes Suicidal Plan?: Yes-Currently Present Has patient had any suicidal plan within the past 6 months prior to admission? : Yes Specify Current Suicidal Plan: Pt stated earlier she plans to stab herself w/ a knife; pt told clinician she plans to hang herself w/ a rope Access to Means: No Specify Access to Suicidal Means: Pt does not have access to sharp knives and states she does not have rope What has been your use of drugs/alcohol within the last 12 months?: Pt denies SA Previous Attempts/Gestures: Yes How many times?: 1 Other Self Harm Risks: None noted Triggers for Past Attempts: Unknown Intentional Self Injurious Behavior: None Family Suicide History: Unable to  assess Recent stressful life event(s): Conflict (Comment)(Pt states she has become aggressive in her group home) Persecutory voices/beliefs?: No Depression: No Depression Symptoms: Feeling angry/irritable Substance abuse history and/or treatment for substance abuse?: No Suicide prevention information given to non-admitted patients: Not applicable  Risk to Others within the past 6 months Homicidal Ideation: No Does patient have any lifetime risk of violence toward others beyond the six months prior to admission? : Yes (comment)(Pt has hx of becoming aggressive towards group home staff) Thoughts of Harm to Others: No Current Homicidal Intent: No Current Homicidal Plan: No Access to Homicidal Means: No Identified Victim: None noted History of harm to others?: Yes Assessment of Violence: In past 6-12 months Violent Behavior Description: Pt has hx of becoming aggressive towards group home staff Does patient have access to weapons?: No(States has had access to knives; this is apparently untrue) Criminal Charges Pending?: No Does patient have a court date: No Is patient on probation?: No  Psychosis Hallucinations: Auditory Delusions: None noted  Mental Status Report Appearance/Hygiene: In scrubs Eye Contact: Fair Motor Activity: Freedom of movement, Other (Comment)(Pt is lying down in her hospital bed) Speech: Slow Level of Consciousness: Quiet/awake Mood: Preoccupied Affect: Blunted Anxiety Level: Minimal Thought Processes: Circumstantial Judgement: Impaired Orientation: Person, Place, Situation(Pt stated she believed it was Oct 2003) Obsessive Compulsive Thoughts/Behaviors: Minimal  Cognitive  Functioning Concentration: Fair Memory: Recent Intact Insight: Poor Impulse Control: Poor Appetite: Good Have you had any weight changes? : No Change Sleep: No Change Total Hours of Sleep: (UTA - pt does not know) Vegetative Symptoms: None  ADLScreening [facility] Assessment  Services) Patient's cognitive ability adequate to safely complete daily activities?: (UTA) Patient able to express need for assistance with ADLs?: (UTA) Independently performs ADLs?: Yes (appropriate for developmental age)  Prior Inpatient Therapy Prior Inpatient Therapy: Yes Prior Therapy Dates: Multiple Prior Therapy Facilty/Provider(s): Pt is unable to recount Reason for Treatment: AVH, SI  Prior Outpatient Therapy Prior Outpatient Therapy: (UTA) Does patient have an ACCT team?: No Does patient have Intensive In-House Services?  : No Does patient have [facility] services? : No Does patient have P4CC services?: No  ADL Screening (condition at time of admission) Patient's cognitive ability adequate to safely complete daily activities?: (UTA) Is the patient deaf or have difficulty hearing?: (UTA) Does the patient have difficulty seeing, even when wearing glasses/contacts?: (UTA) Does the patient have difficulty concentrating, remembering, or making decisions?: (UTA) Patient able to express need for assistance with ADLs?: (UTA) Does the patient have difficulty dressing or bathing?: (UTA) Independently performs ADLs?: Yes (appropriate for developmental age) Does the patient have difficulty walking or climbing stairs?: (UTA) Weakness of Legs: (UTA) Weakness of Arms/Hands: (UTA)  Home Assistive Devices/Equipment Home Assistive Devices/Equipment: (UTA)  Therapy Consults (therapy consults require a physician order) PT Evaluation Needed: No OT Evalulation Needed: No SLP Evaluation Needed: No Abuse/Neglect Assessment (Assessment to be complete while patient is alone) Abuse/Neglect Assessment Can Be Completed: Unable to assess, patient is non-responsive or altered mental status Self-Neglect: Denies Values / Beliefs Cultural Requests During Hospitalization: None Consults Spiritual Care Consult Needed: No Social Work Consult Needed: No Merchant navy officerAdvance Directives (For Healthcare) Does Patient  Have a Medical Advance Directive?: Unable to assess, patient is non-responsive or altered mental status Would patient like information on creating a medical advance directive?: No - Patient declined Nutrition Screen- [facility] Adult/[facility] Patient's home diet: (pt. given sandwich and soda.)     Disposition: Nira ConnJason Berry, NP, reviewed pt's chart and information and determined pt should be observed overnight for safety and stability and re-assessed by psychiatry in the morning. This information was provided to pt's nurse, Marlene BastQuita RN, at [phone].   Disposition Initial Assessment Completed for this Encounter: Yes Patient referred to: Other (Comment)(Pt will be observed overnight for safety and stability)  This service was provided via telemedicine using a 2-way, interactive audio and video technology.  Names of all persons participating in this telemedicine service and their role in this encounter. Name: Alfonzo FellerLinda Fanguy Role: Patient  Name: Nira ConnJason Berry Role: Nurse Practitioner  Name: Duard BradySamantha Aileena Iglesia Role: Clinician    Ralph DowdySamantha L Neal Oshea 02/09/2019 1:04 AM

## 2019-02-09 NOTE — ED Notes (Signed)
Pt behavior not redirectable, pt not following simple direction, refusing to stay in room.  pt left unit, attempting to walk out , security and nursing staff present.

## 2019-02-09 NOTE — Discharge Instructions (Signed)
For your behavioral health needs, you are advised to continue treatment with Mojeed Akintayo, MD: ° °     Mojeed Akintayo, MD °     [facility] °     [address] °     [place], [place] [address] °     [phone]

## 2019-02-13 ENCOUNTER — Other Ambulatory Visit: Payer: Self-pay

## 2019-02-13 ENCOUNTER — Emergency Department (HOSPITAL_COMMUNITY)
Admission: EM | Admit: 2019-02-13 | Discharge: 2019-02-13 | Disposition: A | Discharge status: Home / Self Care | Payer: Medicaid Other | Attending: Emergency Medicine | Admitting: Emergency Medicine

## 2019-02-13 ENCOUNTER — Encounter (HOSPITAL_COMMUNITY): Payer: Self-pay

## 2019-02-13 DIAGNOSIS — Z79899 Other long term (current) drug therapy: Secondary | ICD-10-CM | POA: Diagnosis not present

## 2019-02-13 DIAGNOSIS — R45851 Suicidal ideations: Secondary | ICD-10-CM | POA: Diagnosis not present

## 2019-02-13 DIAGNOSIS — F79 Unspecified intellectual disabilities: Secondary | ICD-10-CM | POA: Insufficient documentation

## 2019-02-13 DIAGNOSIS — F259 Schizoaffective disorder, unspecified: Secondary | ICD-10-CM | POA: Diagnosis present

## 2019-02-13 DIAGNOSIS — Z87891 Personal history of nicotine dependence: Secondary | ICD-10-CM | POA: Insufficient documentation

## 2019-02-13 DIAGNOSIS — E119 Type 2 diabetes mellitus without complications: Secondary | ICD-10-CM | POA: Diagnosis not present

## 2019-02-13 LAB — COMPREHENSIVE METABOLIC PANEL
ALT: 13 U/L (ref 0–44)
AST: 13 U/L — ABNORMAL LOW (ref 15–41)
Albumin: 4.1 g/dL (ref 3.5–5.0)
Alkaline Phosphatase: 66 U/L (ref 38–126)
Anion gap: 8 (ref 5–15)
BUN: 14 mg/dL (ref 6–20)
CO2: 23 mmol/L (ref 22–32)
Calcium: 9 mg/dL (ref 8.9–10.3)
Chloride: 109 mmol/L (ref 98–111)
Creatinine, Ser: 0.63 mg/dL (ref 0.44–1.00)
GFR calc Af Amer: >60 mL/min (ref >60)
GFR calc non Af Amer: >60 mL/min (ref >60)
Glucose, Bld: 120 mg/dL — ABNORMAL HIGH (ref 70–99)
Potassium: 3.2 mmol/L — ABNORMAL LOW (ref 3.5–5.1)
Sodium: 140 mmol/L (ref 135–145)
Total Bilirubin: 0.2 mg/dL — ABNORMAL LOW (ref 0.3–1.2)
Total Protein: 7.5 g/dL (ref 6.5–8.1)

## 2019-02-13 LAB — CBG MONITORING, ED: Glucose-Capillary: 109 mg/dL — ABNORMAL HIGH (ref 70–99)

## 2019-02-13 LAB — CBC WITH DIFFERENTIAL/PLATELET
Abs Immature Granulocytes: 0.01 10*3/uL (ref 0.00–0.07)
Basophils Absolute: 0 10*3/uL (ref 0.0–0.1)
Basophils Relative: 1 %
Eosinophils Absolute: 0.1 10*3/uL (ref 0.0–0.5)
Eosinophils Relative: 2 %
HCT: 35.4 % — ABNORMAL LOW (ref 36.0–46.0)
Hemoglobin: 10.9 g/dL — ABNORMAL LOW (ref 12.0–15.0)
Immature Granulocytes: 0 %
Lymphocytes Relative: 34 %
Lymphs Abs: 2 10*3/uL (ref 0.7–4.0)
MCH: 29.3 pg (ref 26.0–34.0)
MCHC: 30.8 g/dL (ref 30.0–36.0)
MCV: 95.2 fL (ref 80.0–100.0)
Monocytes Absolute: 0.3 10*3/uL (ref 0.1–1.0)
Monocytes Relative: 6 %
Neutro Abs: 3.3 10*3/uL (ref 1.7–7.7)
Neutrophils Relative %: 57 %
Platelets: 299 10*3/uL (ref 150–400)
RBC: 3.72 MIL/uL — ABNORMAL LOW (ref 3.87–5.11)
RDW: 13.7 % (ref 11.5–15.5)
WBC: 5.8 10*3/uL (ref 4.0–10.5)
nRBC: 0 % (ref 0.0–0.2)

## 2019-02-13 LAB — ETHANOL: Alcohol, Ethyl (B): <10 mg/dL (ref <10)

## 2019-02-13 LAB — ACETAMINOPHEN LEVEL: Acetaminophen (Tylenol), Serum: <10 ug/mL — ABNORMAL LOW (ref 10–30)

## 2019-02-13 LAB — SALICYLATE LEVEL: Salicylate Lvl: <7 mg/dL (ref 2.8–30.0)

## 2019-02-13 LAB — HCG, QUANTITATIVE, PREGNANCY: hCG, Beta Chain, Quant, S: <1 m[IU]/mL (ref <5)
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
## 2019-02-13 NOTE — Discharge Instructions (Addendum)
Please follow-up with your regular doctor.  You were evaluated for inpatient psychiatric care, but at this time the behavioral health team believes you to be best served on an outpatient basis.

## 2019-02-13 NOTE — ED Notes (Signed)
Pt states she doesn't want to go back to the group home and wants to talk to a Education officer, museum in the morning

## 2019-02-13 NOTE — Progress Notes (Signed)
Per Anette Riedel,  NP pt does not meet criteria for inpt tx and recommended to f/u with her current provider due to pt being at baseline, secondary gain by coming to ED. Pt recently assessed by TTS on 02/09/19 and psych cleared. Pt returns to ED with similar presentation. TTS spoke with pt's guardian Maple Hudson who reports the pt has a hx of secondary gain and presenting to the ED because she does not like being in the group home and wants snacks given to her in the ED. EDP Montine Circle, PA-C has been advised of disposition.  TTS attempted to contact triage at [phone] multiple times to advise of the disposition but did not receive an answer.  Lind Covert, MSW, LCSW Therapeutic Triage Specialist  [phone]

## 2019-04-24 ENCOUNTER — Observation Stay (HOSPITAL_COMMUNITY)
Admission: RE | Admit: 2019-04-24 | Discharge: 2019-04-25 | Payer: Medicaid Other | Attending: Psychiatry | Admitting: Psychiatry

## 2019-04-24 ENCOUNTER — Encounter (HOSPITAL_COMMUNITY): Payer: Self-pay | Admitting: Family

## 2019-04-24 ENCOUNTER — Other Ambulatory Visit: Payer: Self-pay

## 2019-04-24 DIAGNOSIS — Z79899 Other long term (current) drug therapy: Secondary | ICD-10-CM | POA: Insufficient documentation

## 2019-04-24 DIAGNOSIS — E119 Type 2 diabetes mellitus without complications: Secondary | ICD-10-CM | POA: Diagnosis not present

## 2019-04-24 DIAGNOSIS — Z20828 Contact with and (suspected) exposure to other viral communicable diseases: Secondary | ICD-10-CM | POA: Insufficient documentation

## 2019-04-24 DIAGNOSIS — F209 Schizophrenia, unspecified: Principal | ICD-10-CM | POA: Diagnosis present

## 2019-04-24 DIAGNOSIS — F259 Schizoaffective disorder, unspecified: Secondary | ICD-10-CM | POA: Diagnosis not present

## 2019-04-24 DIAGNOSIS — Z7984 Long term (current) use of oral hypoglycemic drugs: Secondary | ICD-10-CM | POA: Insufficient documentation

## 2019-04-24 DIAGNOSIS — F251 Schizoaffective disorder, depressive type: Secondary | ICD-10-CM

## 2019-04-24 MED ORDER — HYDROXYZINE HCL 25 MG PO TABS
25.0000 mg | ORAL_TABLET | Freq: Three times a day (TID) | ORAL | Status: DC | PRN
  Administered 2019-04-24: 25 mg via ORAL
  Filled 2019-04-24: qty 1

## 2019-04-24 MED ORDER — TRAZODONE HCL 100 MG PO TABS
100.0000 mg | ORAL_TABLET | Freq: Every evening | ORAL | Status: DC | PRN
  Administered 2019-04-24: 100 mg via ORAL
  Filled 2019-04-24: qty 1

## 2019-04-24 MED ORDER — ACETAMINOPHEN 325 MG PO TABS: 650.0000 mg | ORAL_TABLET | Freq: Four times a day (QID) | ORAL | Status: DC | PRN

## 2019-04-24 MED ORDER — HALOPERIDOL 5 MG PO TABS
5.0000 mg | ORAL_TABLET | Freq: Once | ORAL | Status: AC
  Administered 2019-04-24: 5 mg via ORAL
  Filled 2019-04-24: qty 1

## 2019-04-24 MED ORDER — MAGNESIUM HYDROXIDE 400 MG/5ML PO SUSP: 30.0000 mL | Freq: Every day | ORAL | Status: DC | PRN

## 2019-04-24 MED ORDER — ALUM & MAG HYDROXIDE-SIMETH 200-200-20 MG/5ML PO SUSP: 30.0000 mL | ORAL | Status: DC | PRN

## 2019-04-24 MED ORDER — LORAZEPAM 1 MG PO TABS
1.0000 mg | ORAL_TABLET | Freq: Once | ORAL | Status: AC
  Administered 2019-04-24: 21:00:00 1 mg via ORAL
  Filled 2019-04-24: qty 1

## 2019-04-24 NOTE — H&P (Signed)
   Behavioral Health Medical Screening Exam  Brittany Velez is an 33 y.o. female with impulsivity, aggression, property destruction, depression and suicidal thoughts.Patient is well known to our staff here and is seen in the community. She presents today under IVC by [facility]. Further evaluation revealed patient was accepted to [facility]. This was confirmed, first exam completed and [place] completed. [place] contacted to transport patient to [place]. She does endorse suicidal ideations and states she is unable to contract for safety.   Total Time spent with patient: 30 minutes  Psychiatric Specialty Exam: Physical Exam  Review of Systems  Blood pressure (P) 130/87, pulse (P) 98, temperature (!) (P) 97.4 F (36.3 C), temperature source (P) Oral, resp. rate (P) 16, SpO2 (P) 99 %.There is no height or weight on file to calculate BMI.  General Appearance: Fairly Groomed  Eye Contact:  Minimal  Speech:  Clear and Coherent and Slow  Volume:  Decreased  Mood:  Depressed  Affect:  Depressed  Thought Process:  Linear and Descriptions of Associations: Intact  Orientation:  Full (Time, Place, and Person)  Thought Content:  Logical  Suicidal Thoughts:  Yes.  without intent/plan  Homicidal Thoughts:  No  Memory:  Immediate;   Fair Recent;   Fair  Judgement:  Poor  Insight:  Lacking  Psychomotor Activity:  Psychomotor Retardation  Concentration: Concentration: Fair and Attention Span: Fair  Recall:  [employer] of Knowledge:Fair  Language: Fair  Akathisia:  No  Handed:  Right  AIMS (if indicated):     Assets:  Agricultural consultant Housing Physical Health Social Support  Sleep:       Musculoskeletal: Strength & Muscle Tone: within normal limits Gait & Station: normal Patient leans: N/A  Blood pressure (P) 130/87, pulse (P) 98, temperature (!) (P) 97.4 F (36.3 C), temperature source (P) Oral, resp. rate (P) 16, SpO2  (P) 99 %.  Recommendations:  Based on my evaluation the patient does not appear to have an emergency medical condition. Will coordinate transport to [facility].   Suella Broad, FNP 04/24/2019, 6:46 PM

## 2019-04-24 NOTE — Progress Notes (Signed)
Patient ID: Brittany Velez, female   DOB: [birth year], 33 y.o.   MRN: [id number] Pt is alert, oriented to self, labile, and walking back and forth in the hallway. Pt refused COVID-19 Test after several attempts by Probation officer. No apparent distress noted or complaints voiced. Pt is responding to internal stimuli and visual stimuli. Pt denies SI, HI, and AVH. Pt contracted for safety. Will continue to monitor for safety q 15 mins.

## 2019-04-24 NOTE — Plan of Care (Signed)
[facility] Crisis Plan  Reason for Crisis Plan:  Chronic Mental Illness/Medical Illness and Crisis Stabilization   Plan of Care:  Referral for Inpatient Hospitalization  Family Support:   group home staff   Current Living Environment:  Living Arrangements: [place]:   Hospital Account    Name Acct ID Class Status Primary Coverage   Desteni, Piscopo [id number] [place]        Guarantor Account (for Hospital Account [id number])    Name Relation to Pt Service Area Active? Acct Type   Kathi Der Self [facility]   Address Phone       [address] [place], [place] [address] [phone])          Coverage Information (for Hospital Account [id number])    1. [employer] [place]    F/O Payor/Plan Precert #   [employer] [place]ICA[employer]    Subscriber Subscriber #   Meila, Berke [id number]   Address Phone   [address] [place], [place] [address] [phone]       2. [place]    F/O Payor/Plan Precert #   [facility] MEDICAID/[place]    Subscriber Subscriber #   Jac[place], Shirkey [id number]   Address Phone   [address] [place], [place] [address] [phone]          Legal Guardian:  Legal Guardian: Other:(unknown)  Primary Care Provider:  Benito Mccreedy, MD  Current Outpatient Providers:  unknown  Psychiatrist:  Name of Psychiatrist: Beverly Sessions  Counselor/Therapist:  Name of Therapist: Beverly Sessions  Compliant with Medications:  No  Additional Information:  [place] [employer] 12/11/20208:25 PM

## 2019-04-24 NOTE — BH Assessment (Signed)
Spoke to staff sargent at [facility] and arranged for patient to be transported under IVC from [facility] to [facility] at 0700 on 04/25/2019.   Evelena Peat, [facility], [facility] Triage Specialist [phone]
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
## 2019-04-25 DIAGNOSIS — F209 Schizophrenia, unspecified: Secondary | ICD-10-CM | POA: Diagnosis not present

## 2019-04-25 LAB — RESPIRATORY PANEL BY RT PCR (FLU A&B, COVID)
Influenza A by PCR: NEGATIVE
Influenza B by PCR: NEGATIVE
SARS Coronavirus 2 by RT PCR: NEGATIVE

## 2019-04-25 MED ORDER — OLANZAPINE 10 MG PO TBDP
10.0000 mg | ORAL_TABLET | Freq: Once | ORAL | Status: AC
  Administered 2019-04-25: 10 mg via ORAL
  Filled 2019-04-25: qty 1

## 2019-04-25 MED ORDER — LORAZEPAM 1 MG PO TABS
1.0000 mg | ORAL_TABLET | Freq: Once | ORAL | Status: AC
  Administered 2019-04-25: 1 mg via ORAL
  Filled 2019-04-25: qty 1

## 2019-04-25 NOTE — Discharge Instructions (Addendum)
Discharge recommendations:  The patient is being discharged to [facility].  Follow regular diet and activity as tolerated. May benefit from moderate exercise.   Medications: Transfer facility [facility]) will need to review medications with group home. Group home staff was not available to review most current medications.   Atypical antipsychotics: We recommend that she get AIMS scale, height, weight, blood pressure, fasting lipid panel, fasting blood sugar in three months from discharge as patient is taking atypical antipsychotics.   Safety:  The patient should abstain from use of illicit substances/drugs and abuse of any medications.  If the patient's symptoms worsen or do not continue to improve or if the patient becomes actively suicidal or homicidal then it is recommended that the patient return to the [facility] emergency [facility] or call 911 for further evaluation and treatment. National Suicide Prevention Lifeline 1-800-SUICIDE or 409 625 7237.

## 2019-04-25 NOTE — Progress Notes (Signed)
Patient discharged to [facility] via [place] transportation. Shift report given to [facility], RN. [phone]

## 2019-04-25 NOTE — Progress Notes (Signed)
Patient slept throughout the night. Currently out of bed and has eaten breakfast. Alert and oriented. No sign of distress. To be transferred to [facility] per MD recommendation.

## 2019-04-25 NOTE — Progress Notes (Signed)
Pt has been given Zyprexa Zydis x 2 see MAR for labile behaviors: crying, laughing out loud, responding to visual and internal stimuli and banging on the door to [facility]BS and pacing the hall. Pt allowed writer to obtain nasal swab for COVID-19 Test-sent to lab. No apparent distress noted or attempts made to harm self others noted. Will continue to monitor q 15 mins for safety.
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
## 2019-11-21 ENCOUNTER — Other Ambulatory Visit: Payer: Self-pay

## 2019-11-21 ENCOUNTER — Ambulatory Visit (HOSPITAL_COMMUNITY)
Admission: EM | Admit: 2019-11-21 | Discharge: 2019-11-21 | Disposition: A | Discharge status: Home / Self Care | Payer: Medicaid Other | Attending: Nurse Practitioner | Admitting: Nurse Practitioner

## 2019-11-21 DIAGNOSIS — F333 Major depressive disorder, recurrent, severe with psychotic symptoms: Secondary | ICD-10-CM | POA: Insufficient documentation

## 2019-11-21 DIAGNOSIS — R45851 Suicidal ideations: Secondary | ICD-10-CM | POA: Insufficient documentation

## 2019-11-21 DIAGNOSIS — F419 Anxiety disorder, unspecified: Secondary | ICD-10-CM | POA: Insufficient documentation

## 2019-11-21 DIAGNOSIS — F259 Schizoaffective disorder, unspecified: Secondary | ICD-10-CM

## 2019-11-21 DIAGNOSIS — R450 Nervousness: Secondary | ICD-10-CM | POA: Insufficient documentation

## 2019-11-21 NOTE — BH Assessment (Signed)
Pt was picked up by [employer] from [facility] and transported to group home.

## 2019-11-21 NOTE — BH Assessment (Signed)
Comprehensive Clinical Assessment (CCA) Screening, Triage and Referral Note  11/21/2019 Brittany Velez [id number]  Pt left the group home today and walked to a neighbor's house and they called 911.  Pt was then brought to [facility].  Patient said that she had some thoughts of suicide.  Initially patient says she had no plan to kill herself.  A few minutes later she said she would cut her wrists.  Pt later in the evening recants and said she did not have a plan.  Patient denies any HI.  She told this clinician she had no A/V hallucinations but told Barbara Cower, NP that she heard voices.    Pt is with [employer].  She lives at [facility].  Theresia Lo [phone] is Corporate investment banker.  Blair Heys, QP [phone] is Tree surgeon.  Clinician spoke with both of them.  Patient has a guardian with [facility] DSS [place]).  She has a care coordinator with [facility], named Desiree [phone].   She is followed by Dr. Jannifer Franklin for psychiatric services.  Blair Heys, QP with [facility] said that pt is supposed to be moving to a new facility on Friday (07/16).    Clinician talked to Theresia Lo (owner).  She said that patient was recently discharged from [facility].  This discharge was two weeks ago.  In December of 2020 she went to [facility] then was transferred from [facility] to [facility].  Theresia Lo said that patient was discharged without a IM shot of Haldol which she was getting at [facility].  Current meds according to Blair Heys, QP 1) Sennosizes 8.5mg  tab Three tabs oral twice a day. 2) Benztripine 1mg  one tab twice per day 3) Colzapine 200mg  tab 2 tabs twice daily 4) Metoproiol ER 25mg  tabs take 3 tabs once a day 5) Didamproex 250mg  one tab twice daily 6) Haloperdol 20mg  one tab twice daily 7) Lorazepam 1mg  one tab twice daily 8) Didamproex 500mg  one tab twice daily 9) Folic acid one tab in AM 10) Miralax 17grams twice a day 11) Ipratathylene 0.06% twice a day in each  nostril  Patient has a flat affect.  She is intrusive, wandering the hall and going into offices and eating food off desks.  Pt is not easily redirected.  She is essentially a one-on-one at this time.    Clinician and talked about patient going back to the group home.  Clinician contacted [employer] non-emergency and they are checking to see if they are supposed to be taking patients back to homes.  Visit Diagnosis:    ICD-10-CM   1. Schizoaffective disorder, unspecified type (HCC)  F25.9     Patient Reported Information How did you hear about ? Self   Referral name: Pt  called 911 to get some help.  She called and the [employer] officers brought her to [facility].   Referral phone number: No data recorded Whom do you see for routine medical problems? Primary Care   Practice/Facility Name:  ) for general healthcare.  [facility] (Dr. )   Practice/Facility Phone Number: No data recorded  Name of Contact: No data recorded  Contact Number: No data recorded  Contact Fax Number: No data recorded  Prescriber Name: No data recorded  Prescriber Address (if known): No data recorded What Is the Reason for Your Visit/Call Today? "I need to get some help.  Feeling suicidal."  Plan to cut her wrists..  How Long Has This Been Causing You Problems? >  than 6 months  Have You Recently Been in Any Inpatient Treatment (Hospital/Detox/Crisis Center/28-Day Program)? Yes   Name/Location of Program/Hospital:[facility]   How Long Were You There? For over 6 months   When Were You Discharged? 11/06/19  Have You Ever Received Services From [facility] Before? Yes   Who Do You See at [facility]? OBS unit at [facility] in 04/2019.  Have You Recently Had Any Thoughts About Hurting Yourself? Yes   Are You Planning to Commit Suicide/Harm Yourself At This time?  Yes  Have you Recently Had Thoughts About Hurting Someone Karolee Ohs? No   Explanation: No data recorded Have You Used  Any Alcohol or Drugs in the Past 24 Hours? No   How Long Ago Did You Use Drugs or Alcohol?  No data recorded  What Did You Use and How Much? No data recorded What Do You Feel Would Help You the Most Today? Assessment Only  Do You Currently Have a Therapist/Psychiatrist? Yes   Name of Therapist/Psychiatrist: Dr. Syble Creek at [facility] in [place]   Have You Been Recently Discharged From Any Office Practice or Programs? No   Explanation of Discharge From Practice/Program:  No data recorded    CCA Screening Triage Referral Assessment Type of Contact: Face-to-Face   Is this Initial or Reassessment? No data recorded  Date Telepsych consult ordered in [facility]:  No data recorded  Time Telepsych consult ordered in [facility]:  No data recorded Patient Reported Information Reviewed? Yes   Patient Left Without Being Seen? No data recorded  Reason for Not Completing Assessment: Tele-Assessment machine is not working at this time  Collateral Involvement: No data recorded Does Patient Have a Automotive engineer Guardian? No data recorded  Name and Contact of Legal Guardian:   [employer] [employer] [place], [phone]  If Minor and Not Living with Parent(s), Who has Custody? [facility].   Is CPS involved or ever been involved? No data recorded Is APS involved or ever been involved? No data recorded Patient Determined To Be At Risk for Harm To Self or Others Based on Review of Patient Reported Information or Presenting Complaint? Yes, for Self-Harm   Method: No data recorded  Availability of Means: No data recorded  Intent: No data recorded  Notification Required: No data recorded  Additional Information for Danger to Others Potential:  No data recorded  Additional Comments for Danger to Others Potential:  No data recorded  Are There Guns or Other Weapons in Your Home?  No data recorded   Types of Guns/Weapons: No data recorded   Are These Weapons Safely Secured?                               No data recorded   Who Could Verify You Are Able To Have These Secured:    No data recorded Do You Have any Outstanding Charges, Pending Court Dates, Parole/Probation? No data recorded Contacted To Inform of Risk of Harm To Self or Others: No data recorded Location of Assessment: [facility] Services  Does Patient Present under Involuntary Commitment? No   IVC Papers Initial File Date: No data recorded  [place] of Residence: [place]  Patient Currently Receiving the Following Services: Medication Management   Determination of Need: Emergent (2 hours)   Options For Referral: [facility]   [place]and[place] Lodge, LCAS

## 2019-11-22 NOTE — ED Provider Notes (Signed)
Behavioral Health Medical Screening Exam  Brittany Velez is a 34 y.o. female who presents to [facility] voluntarily with law enforcement. Patient states "I don't like my group home. I want to go somewhere else." She reports that she has been having suicidal thoughts today. She denies intent and plan. She reports that she hears voices at times that tell her to kill herself. She denies current auditory hallucinations and states that she has not heard the voices today. She does not appear to be responding to internal stimuli. At the end of the interview the patient states that she is sleepy and ready to go back to the group home so she can get in her bed and go to sleep.   TTS Assessment:  Pt left the group home today and walked to a neighbor's house and they called 911.  Pt was then brought to [facility].  Patient said that she had some thoughts of suicide.  Initially patient says she had no plan to kill herself.  A few minutes later she said she would cut her wrists.  Pt later in the evening recants and said she did not have a plan.  Patient denies any HI.  She told this clinician she had no A/V hallucinations but told Barbara Cower, NP that she heard voices.    Pt is with [employer].  She lives at [facility].  Theresia Lo [phone] is Corporate investment banker.  Blair Heys, QP [phone] is Tree surgeon.  Clinician spoke with both of them.  Patient has a guardian with [facility] DSS [place]).  She has a care coordinator with [facility], named Desiree [phone].   She is followed by Dr. Jannifer Franklin for psychiatric services.  Blair Heys, QP with [facility] said that pt is supposed to be moving to a new facility on Friday (07/16).    Total Time spent with patient: 30 minutes  Psychiatric Specialty Exam  Presentation  General Appearance:Casual;Fairly Groomed  Eye Contact:Good  Speech:Clear and Coherent;Slow  Speech Volume:Normal  Handedness:No data recorded  Mood and Affect   Mood:Depressed  Affect:Flat   Thought Process  Thought Processes:Coherent;Linear  Descriptions of Associations:Loose  Orientation:Full (Time, Place and Person)  Thought Content:Logical  Hallucinations:Other (comment) (Denies current auditory and visual hallucinations. States that she has heard voices in the past that tell her to kill herself.)  Ideas of Reference:None  Suicidal Thoughts:Yes, Passive Without Intent;Without Plan  Homicidal Thoughts:No   Sensorium  Memory:Immediate Fair;Recent Fair;Remote Fair  Judgment:Intact  Insight:Lacking   Executive Functions  Concentration:Fair  Attention Span:Fair  Recall:Fair  Fund of Knowledge:Fair  Language:Fair   Psychomotor Activity  Psychomotor Activity:Normal   Assets  Assets:Housing;Physical Health;Social Support   Sleep  Sleep:Fair  Number of hours: No data recorded  Physical Exam: Physical Exam Constitutional:      General: She is not in acute distress.    Appearance: She is not ill-appearing, toxic-appearing or diaphoretic.  HENT:     Head: Normocephalic.     Right Ear: External ear normal.     Left Ear: External ear normal.  Eyes:     Pupils: Pupils are equal, round, and reactive to light.  Cardiovascular:     Rate and Rhythm: Normal rate.  Pulmonary:     Effort: Pulmonary effort is normal. No respiratory distress.  Musculoskeletal:        General: Normal range of motion.  Neurological:     Mental Status: She is alert and oriented to person, place, and time.  Review of Systems  Constitutional: Negative for chills, diaphoresis, fever, malaise/fatigue and weight loss.  Respiratory: Negative for cough and shortness of breath.   Cardiovascular: Negative for chest pain.  Gastrointestinal: Negative for diarrhea, nausea and vomiting.  Psychiatric/Behavioral: Positive for depression, hallucinations and suicidal ideas. Negative for memory loss and substance abuse. The patient is  nervous/anxious. The patient does not have insomnia.    Blood pressure 129/70, pulse 95, temperature 98.6 F (37 C), temperature source Oral, resp. rate 20. There is no height or weight on file to calculate BMI.  Musculoskeletal: Strength & Muscle Tone: within normal limits Gait & Station: normal Patient leans: N/A   Recommendations:  Based on my evaluation the patient does not appear to have an emergency medical condition.   Disposition: No evidence of imminent risk to self or others at present.   Patient does not meet criteria for psychiatric inpatient admission. Supportive therapy provided about ongoing stressors. Discussed crisis plan, support from social network, calling 911, coming to the Emergency Department, and calling Suicide Hotline.   Jackelyn Poling, NP 11/22/2019, 12:13 AM
